# Patient Record
Sex: Male | Born: 1950 | Race: White | Hispanic: No | Smoking: Former smoker
Health system: Southern US, Community
[De-identification: ages and names within clinical notes are randomized; demographics above are authoritative.]

## PROBLEM LIST (undated history)

## (undated) DIAGNOSIS — I499 Cardiac arrhythmia, unspecified: Secondary | ICD-10-CM

## (undated) DIAGNOSIS — M109 Gout, unspecified: Secondary | ICD-10-CM

## (undated) DIAGNOSIS — I10 Essential (primary) hypertension: Secondary | ICD-10-CM

## (undated) HISTORY — PX: COLONOSCOPY: SHX174

## (undated) HISTORY — PX: CYSTOSCOPY: SUR368

## (undated) HISTORY — PX: TONSILLECTOMY: SUR1361

---

## 1998-11-17 ENCOUNTER — Other Ambulatory Visit: Admission: RE | Admit: 1998-11-17 | Discharge: 1998-11-17 | Payer: Self-pay | Admitting: Gastroenterology

## 2012-09-28 ENCOUNTER — Ambulatory Visit (HOSPITAL_COMMUNITY): Payer: BC Managed Care – PPO

## 2012-09-28 ENCOUNTER — Other Ambulatory Visit (HOSPITAL_COMMUNITY): Payer: Self-pay | Admitting: Internal Medicine

## 2012-09-28 DIAGNOSIS — R112 Nausea with vomiting, unspecified: Secondary | ICD-10-CM

## 2012-09-28 DIAGNOSIS — R945 Abnormal results of liver function studies: Secondary | ICD-10-CM

## 2014-01-17 ENCOUNTER — Ambulatory Visit (INDEPENDENT_AMBULATORY_CARE_PROVIDER_SITE_OTHER): Payer: Medicare HMO | Admitting: Otolaryngology

## 2014-01-17 DIAGNOSIS — H903 Sensorineural hearing loss, bilateral: Secondary | ICD-10-CM

## 2014-01-17 DIAGNOSIS — H6120 Impacted cerumen, unspecified ear: Secondary | ICD-10-CM

## 2015-03-06 DIAGNOSIS — J309 Allergic rhinitis, unspecified: Principal | ICD-10-CM

## 2015-03-06 DIAGNOSIS — H1010 Acute atopic conjunctivitis, unspecified eye: Secondary | ICD-10-CM

## 2015-03-20 DIAGNOSIS — M1A9XX1 Chronic gout, unspecified, with tophus (tophi): Secondary | ICD-10-CM | POA: Diagnosis not present

## 2015-03-20 DIAGNOSIS — H6122 Impacted cerumen, left ear: Secondary | ICD-10-CM | POA: Diagnosis not present

## 2015-03-20 DIAGNOSIS — L989 Disorder of the skin and subcutaneous tissue, unspecified: Secondary | ICD-10-CM | POA: Diagnosis not present

## 2015-03-20 DIAGNOSIS — K4090 Unilateral inguinal hernia, without obstruction or gangrene, not specified as recurrent: Secondary | ICD-10-CM | POA: Diagnosis not present

## 2015-03-26 DIAGNOSIS — L820 Inflamed seborrheic keratosis: Secondary | ICD-10-CM | POA: Diagnosis not present

## 2015-03-26 DIAGNOSIS — D492 Neoplasm of unspecified behavior of bone, soft tissue, and skin: Secondary | ICD-10-CM | POA: Diagnosis not present

## 2015-03-26 DIAGNOSIS — C44391 Other specified malignant neoplasm of skin of nose: Secondary | ICD-10-CM | POA: Diagnosis not present

## 2015-03-26 DIAGNOSIS — C44329 Squamous cell carcinoma of skin of other parts of face: Secondary | ICD-10-CM | POA: Diagnosis not present

## 2015-03-26 DIAGNOSIS — C44320 Squamous cell carcinoma of skin of unspecified parts of face: Secondary | ICD-10-CM | POA: Diagnosis not present

## 2015-03-26 DIAGNOSIS — D225 Melanocytic nevi of trunk: Secondary | ICD-10-CM | POA: Diagnosis not present

## 2015-04-01 ENCOUNTER — Ambulatory Visit (INDEPENDENT_AMBULATORY_CARE_PROVIDER_SITE_OTHER): Payer: Medicare HMO

## 2015-04-01 ENCOUNTER — Ambulatory Visit (INDEPENDENT_AMBULATORY_CARE_PROVIDER_SITE_OTHER): Payer: Medicare HMO | Admitting: Orthopedic Surgery

## 2015-04-01 VITALS — BP 122/70 | Ht 73.5 in | Wt 241.0 lb

## 2015-04-01 DIAGNOSIS — H1010 Acute atopic conjunctivitis, unspecified eye: Secondary | ICD-10-CM

## 2015-04-01 DIAGNOSIS — J309 Allergic rhinitis, unspecified: Secondary | ICD-10-CM | POA: Diagnosis not present

## 2015-04-01 DIAGNOSIS — M1A0491 Idiopathic chronic gout, unspecified hand, with tophus (tophi): Secondary | ICD-10-CM

## 2015-04-01 DIAGNOSIS — M25442 Effusion, left hand: Secondary | ICD-10-CM

## 2015-04-01 NOTE — Progress Notes (Signed)
Patient ID: Geoffrey Patterson, male   DOB: 1951/06/04, 64 y.o.   MRN: [id number]  Chief Complaint  Patient presents with  . Hand Problem    left first finger swelling and stiffness, REFERRED BY Z HALL    HPI Geoffrey Patterson is a 64 y.o. male.  64 years old male several month history of pain and swelling of the left index finger with mild dull ache associated with loss of motion. No specific modifying factors or associated symptoms other than the loss of motion. He does report remote injury to the index finger as a child he listed his review of systems as completely negative other than swelling and gout type symptoms over the right olecranon bursa and over the great toe. Medical history listed negative no surgeries he does take hypertensive medication no allergies see list his family history is negative doesn't smoke or drink and he is employed.  Review of Systems Review of Systems   Social History Social History  Substance Use Topics  . Smoking status: Not on file  . Smokeless tobacco: Not on file  . Alcohol Use: Not on file    Allergies not on file  Current Outpatient Prescriptions  Medication Sig Dispense Refill  . ALLOPURINOL PO Take by mouth.    . Amlodipine Besylate-Valsartan (EXFORGE PO) Take by mouth.    . Fexofenadine HCl (ALLEGRA PO) Take by mouth as needed.    . naproxen sodium (ANAPROX) 220 MG tablet Take 220 mg by mouth 2 (two) times daily with a meal.    . Triamcinolone Acetonide (NASACORT AQ NA) Place into the nose.    . ValACYclovir HCl (VALTREX PO) Take by mouth daily.     No current facility-administered medications for this visit.       Physical Exam Physical Exam Blood pressure 122/70, height 6' 1.5" (1.867 m), weight 241 lb (109.317 kg). Appearance, there are no abnormalities in terms of appearance the patient was well-developed and well-nourished. The grooming and hygiene were normal.  Mental status orientation, there was normal alertness and  orientation Mood pleasant Ambulatory status normal with no assistive devices  Examination of the left index finger reveals soft tissue swelling and gout tophus around the index finger which is blocking any range of motion although the joint is stable. Flexor tendon strength he could partially make a fist. The skin was intact warm dry no laceration ulceration or erythema but gout tophus visible through the skin. Sensation intact capillary refill normal  He also had gout tophus in the toe and also over the right elbow   Data Reviewed Imaging shows a bony erosions around the PIP joint with hyperextension deformity of the PIP joint  Assessment  Gout tophus index finger left hand   Plan  Recommend referral to Dr. Erlinda Hong for definitive management and treatment

## 2015-04-01 NOTE — Patient Instructions (Signed)
We will let Dr Nevada Crane know that you need referral to Hand Specialist Dr. Erlinda Hong for gout left index finger

## 2015-04-08 ENCOUNTER — Ambulatory Visit (INDEPENDENT_AMBULATORY_CARE_PROVIDER_SITE_OTHER): Payer: Medicare HMO

## 2015-04-08 DIAGNOSIS — J301 Allergic rhinitis due to pollen: Secondary | ICD-10-CM | POA: Diagnosis not present

## 2015-04-15 ENCOUNTER — Ambulatory Visit (INDEPENDENT_AMBULATORY_CARE_PROVIDER_SITE_OTHER): Payer: Medicare HMO

## 2015-04-15 DIAGNOSIS — J301 Allergic rhinitis due to pollen: Secondary | ICD-10-CM

## 2015-04-22 ENCOUNTER — Ambulatory Visit (INDEPENDENT_AMBULATORY_CARE_PROVIDER_SITE_OTHER): Payer: Medicare HMO

## 2015-04-22 DIAGNOSIS — J301 Allergic rhinitis due to pollen: Secondary | ICD-10-CM | POA: Diagnosis not present

## 2015-04-25 DIAGNOSIS — C44391 Other specified malignant neoplasm of skin of nose: Secondary | ICD-10-CM | POA: Diagnosis not present

## 2015-04-25 DIAGNOSIS — C44329 Squamous cell carcinoma of skin of other parts of face: Secondary | ICD-10-CM | POA: Diagnosis not present

## 2015-04-29 ENCOUNTER — Ambulatory Visit (INDEPENDENT_AMBULATORY_CARE_PROVIDER_SITE_OTHER): Payer: Medicare HMO

## 2015-04-29 DIAGNOSIS — J301 Allergic rhinitis due to pollen: Secondary | ICD-10-CM

## 2015-04-30 DIAGNOSIS — Z08 Encounter for follow-up examination after completed treatment for malignant neoplasm: Secondary | ICD-10-CM | POA: Diagnosis not present

## 2015-04-30 DIAGNOSIS — L820 Inflamed seborrheic keratosis: Secondary | ICD-10-CM | POA: Diagnosis not present

## 2015-04-30 DIAGNOSIS — Z85828 Personal history of other malignant neoplasm of skin: Secondary | ICD-10-CM | POA: Diagnosis not present

## 2015-04-30 DIAGNOSIS — D225 Melanocytic nevi of trunk: Secondary | ICD-10-CM | POA: Diagnosis not present

## 2015-04-30 DIAGNOSIS — D485 Neoplasm of uncertain behavior of skin: Secondary | ICD-10-CM | POA: Diagnosis not present

## 2015-05-27 ENCOUNTER — Ambulatory Visit (INDEPENDENT_AMBULATORY_CARE_PROVIDER_SITE_OTHER): Payer: Medicare HMO

## 2015-05-27 DIAGNOSIS — J309 Allergic rhinitis, unspecified: Secondary | ICD-10-CM

## 2015-06-23 DIAGNOSIS — R69 Illness, unspecified: Secondary | ICD-10-CM | POA: Diagnosis not present

## 2015-06-24 ENCOUNTER — Ambulatory Visit (INDEPENDENT_AMBULATORY_CARE_PROVIDER_SITE_OTHER): Payer: Medicare HMO | Admitting: Neurology

## 2015-06-24 DIAGNOSIS — J309 Allergic rhinitis, unspecified: Secondary | ICD-10-CM | POA: Diagnosis not present

## 2015-07-08 DIAGNOSIS — R69 Illness, unspecified: Secondary | ICD-10-CM | POA: Diagnosis not present

## 2015-08-19 ENCOUNTER — Ambulatory Visit (INDEPENDENT_AMBULATORY_CARE_PROVIDER_SITE_OTHER): Payer: Medicare HMO

## 2015-08-19 DIAGNOSIS — J309 Allergic rhinitis, unspecified: Secondary | ICD-10-CM

## 2015-09-01 DIAGNOSIS — E782 Mixed hyperlipidemia: Secondary | ICD-10-CM | POA: Diagnosis not present

## 2015-09-01 DIAGNOSIS — I10 Essential (primary) hypertension: Secondary | ICD-10-CM | POA: Diagnosis not present

## 2015-09-01 DIAGNOSIS — Z0000 Encounter for general adult medical examination without abnormal findings: Secondary | ICD-10-CM | POA: Diagnosis not present

## 2015-09-08 DIAGNOSIS — I10 Essential (primary) hypertension: Secondary | ICD-10-CM | POA: Diagnosis not present

## 2015-09-16 ENCOUNTER — Ambulatory Visit (INDEPENDENT_AMBULATORY_CARE_PROVIDER_SITE_OTHER): Payer: Medicare HMO

## 2015-09-16 DIAGNOSIS — J309 Allergic rhinitis, unspecified: Secondary | ICD-10-CM | POA: Diagnosis not present

## 2015-10-07 DIAGNOSIS — J3089 Other allergic rhinitis: Secondary | ICD-10-CM | POA: Diagnosis not present

## 2015-10-09 DIAGNOSIS — R69 Illness, unspecified: Secondary | ICD-10-CM | POA: Diagnosis not present

## 2015-10-14 ENCOUNTER — Ambulatory Visit (INDEPENDENT_AMBULATORY_CARE_PROVIDER_SITE_OTHER): Payer: Medicare HMO

## 2015-10-14 DIAGNOSIS — J309 Allergic rhinitis, unspecified: Secondary | ICD-10-CM | POA: Diagnosis not present

## 2015-10-21 ENCOUNTER — Ambulatory Visit (INDEPENDENT_AMBULATORY_CARE_PROVIDER_SITE_OTHER): Payer: Medicare HMO

## 2015-10-21 DIAGNOSIS — J309 Allergic rhinitis, unspecified: Secondary | ICD-10-CM | POA: Diagnosis not present

## 2015-10-28 ENCOUNTER — Ambulatory Visit (INDEPENDENT_AMBULATORY_CARE_PROVIDER_SITE_OTHER): Payer: Medicare HMO | Admitting: *Deleted

## 2015-10-28 DIAGNOSIS — J309 Allergic rhinitis, unspecified: Secondary | ICD-10-CM

## 2015-11-04 ENCOUNTER — Ambulatory Visit (INDEPENDENT_AMBULATORY_CARE_PROVIDER_SITE_OTHER): Payer: Medicare HMO

## 2015-11-04 DIAGNOSIS — J309 Allergic rhinitis, unspecified: Secondary | ICD-10-CM

## 2015-11-11 ENCOUNTER — Ambulatory Visit (INDEPENDENT_AMBULATORY_CARE_PROVIDER_SITE_OTHER): Payer: Medicare HMO

## 2015-11-11 DIAGNOSIS — J309 Allergic rhinitis, unspecified: Secondary | ICD-10-CM

## 2015-12-02 ENCOUNTER — Ambulatory Visit (INDEPENDENT_AMBULATORY_CARE_PROVIDER_SITE_OTHER): Payer: Medicare HMO

## 2015-12-02 DIAGNOSIS — J309 Allergic rhinitis, unspecified: Secondary | ICD-10-CM

## 2015-12-30 ENCOUNTER — Ambulatory Visit (INDEPENDENT_AMBULATORY_CARE_PROVIDER_SITE_OTHER): Payer: Medicare HMO

## 2015-12-30 DIAGNOSIS — J309 Allergic rhinitis, unspecified: Secondary | ICD-10-CM | POA: Diagnosis not present

## 2016-01-20 DIAGNOSIS — H6123 Impacted cerumen, bilateral: Secondary | ICD-10-CM | POA: Diagnosis not present

## 2016-01-27 ENCOUNTER — Ambulatory Visit (INDEPENDENT_AMBULATORY_CARE_PROVIDER_SITE_OTHER): Payer: Medicare HMO

## 2016-01-27 DIAGNOSIS — J309 Allergic rhinitis, unspecified: Secondary | ICD-10-CM | POA: Diagnosis not present

## 2016-02-24 ENCOUNTER — Ambulatory Visit (INDEPENDENT_AMBULATORY_CARE_PROVIDER_SITE_OTHER): Payer: Medicare HMO | Admitting: *Deleted

## 2016-02-24 DIAGNOSIS — J309 Allergic rhinitis, unspecified: Secondary | ICD-10-CM

## 2016-02-27 DIAGNOSIS — M1A9XX1 Chronic gout, unspecified, with tophus (tophi): Secondary | ICD-10-CM | POA: Diagnosis not present

## 2016-02-27 DIAGNOSIS — I10 Essential (primary) hypertension: Secondary | ICD-10-CM | POA: Diagnosis not present

## 2016-02-27 DIAGNOSIS — E790 Hyperuricemia without signs of inflammatory arthritis and tophaceous disease: Secondary | ICD-10-CM | POA: Diagnosis not present

## 2016-03-08 DIAGNOSIS — Z23 Encounter for immunization: Secondary | ICD-10-CM | POA: Diagnosis not present

## 2016-03-23 ENCOUNTER — Ambulatory Visit (INDEPENDENT_AMBULATORY_CARE_PROVIDER_SITE_OTHER): Payer: Medicare HMO | Admitting: *Deleted

## 2016-03-23 DIAGNOSIS — J309 Allergic rhinitis, unspecified: Secondary | ICD-10-CM

## 2016-03-29 DIAGNOSIS — J3089 Other allergic rhinitis: Secondary | ICD-10-CM | POA: Diagnosis not present

## 2016-04-20 ENCOUNTER — Ambulatory Visit (INDEPENDENT_AMBULATORY_CARE_PROVIDER_SITE_OTHER): Payer: Medicare HMO | Admitting: *Deleted

## 2016-04-20 DIAGNOSIS — J309 Allergic rhinitis, unspecified: Secondary | ICD-10-CM | POA: Diagnosis not present

## 2016-04-23 DIAGNOSIS — R69 Illness, unspecified: Secondary | ICD-10-CM | POA: Diagnosis not present

## 2016-05-18 ENCOUNTER — Ambulatory Visit (INDEPENDENT_AMBULATORY_CARE_PROVIDER_SITE_OTHER): Payer: Medicare HMO | Admitting: *Deleted

## 2016-05-18 DIAGNOSIS — J309 Allergic rhinitis, unspecified: Secondary | ICD-10-CM | POA: Diagnosis not present

## 2016-05-24 ENCOUNTER — Telehealth: Payer: Self-pay | Admitting: *Deleted

## 2016-05-24 NOTE — Telephone Encounter (Signed)
PT WOULD LIKE A RETURN CALL REGARDING HIS BILL. ACCOUNT NUMBER IS [id number].

## 2016-05-25 NOTE — Telephone Encounter (Signed)
Pt is wondering why he got a new vial - he also talked to [facility] - she is handling this & will call him back

## 2016-05-26 ENCOUNTER — Telehealth: Payer: Self-pay | Admitting: Allergy & Immunology

## 2016-05-26 NOTE — Telephone Encounter (Signed)
Beth, This patient called over to [facility] today and said he talked with someone yesterday about his bill. He couldn't remember who he talked to . So I was going to send to [place], but saw that she has documented that you would take care of this. He said who he talked to was going to research it and get back to him.

## 2016-05-28 NOTE — Telephone Encounter (Signed)
Called patient.  Not in and will be back in 30 minutes per receptionist.  Did not leave message.  Will call patient back.

## 2016-06-03 NOTE — Telephone Encounter (Signed)
Patient came by [facility] office on 06/01/16.  Vials and bill explained to patient.  Patient paid bill.

## 2016-06-15 ENCOUNTER — Ambulatory Visit (INDEPENDENT_AMBULATORY_CARE_PROVIDER_SITE_OTHER): Payer: Medicare HMO | Admitting: *Deleted

## 2016-06-15 DIAGNOSIS — J309 Allergic rhinitis, unspecified: Secondary | ICD-10-CM

## 2016-07-13 ENCOUNTER — Ambulatory Visit (INDEPENDENT_AMBULATORY_CARE_PROVIDER_SITE_OTHER): Payer: Medicare HMO | Admitting: *Deleted

## 2016-07-13 DIAGNOSIS — J309 Allergic rhinitis, unspecified: Secondary | ICD-10-CM

## 2016-07-16 DIAGNOSIS — R69 Illness, unspecified: Secondary | ICD-10-CM | POA: Diagnosis not present

## 2016-08-10 ENCOUNTER — Ambulatory Visit (INDEPENDENT_AMBULATORY_CARE_PROVIDER_SITE_OTHER): Payer: Medicare HMO | Admitting: *Deleted

## 2016-08-10 DIAGNOSIS — J309 Allergic rhinitis, unspecified: Secondary | ICD-10-CM

## 2016-08-10 NOTE — Addendum Note (Signed)
Addended by: Felipa Emory on: 08/10/2016 03:39 PM   Modules accepted: Orders

## 2016-08-27 DIAGNOSIS — M1A9XX1 Chronic gout, unspecified, with tophus (tophi): Secondary | ICD-10-CM | POA: Diagnosis not present

## 2016-08-27 DIAGNOSIS — I10 Essential (primary) hypertension: Secondary | ICD-10-CM | POA: Diagnosis not present

## 2016-08-27 DIAGNOSIS — Z6832 Body mass index (BMI) 32.0-32.9, adult: Secondary | ICD-10-CM | POA: Diagnosis not present

## 2016-08-27 DIAGNOSIS — L309 Dermatitis, unspecified: Secondary | ICD-10-CM | POA: Diagnosis not present

## 2016-08-27 DIAGNOSIS — Z0000 Encounter for general adult medical examination without abnormal findings: Secondary | ICD-10-CM | POA: Diagnosis not present

## 2016-08-31 ENCOUNTER — Encounter (INDEPENDENT_AMBULATORY_CARE_PROVIDER_SITE_OTHER): Payer: Self-pay | Admitting: *Deleted

## 2016-09-07 ENCOUNTER — Ambulatory Visit (INDEPENDENT_AMBULATORY_CARE_PROVIDER_SITE_OTHER): Payer: Medicare HMO | Admitting: *Deleted

## 2016-09-07 DIAGNOSIS — J309 Allergic rhinitis, unspecified: Secondary | ICD-10-CM | POA: Diagnosis not present

## 2016-09-09 ENCOUNTER — Encounter (INDEPENDENT_AMBULATORY_CARE_PROVIDER_SITE_OTHER): Payer: Self-pay | Admitting: *Deleted

## 2016-09-16 ENCOUNTER — Other Ambulatory Visit (INDEPENDENT_AMBULATORY_CARE_PROVIDER_SITE_OTHER): Payer: Self-pay | Admitting: *Deleted

## 2016-09-16 DIAGNOSIS — Z1211 Encounter for screening for malignant neoplasm of colon: Secondary | ICD-10-CM | POA: Insufficient documentation

## 2016-10-05 ENCOUNTER — Ambulatory Visit (INDEPENDENT_AMBULATORY_CARE_PROVIDER_SITE_OTHER): Payer: Medicare HMO | Admitting: *Deleted

## 2016-10-05 DIAGNOSIS — J3089 Other allergic rhinitis: Secondary | ICD-10-CM

## 2016-10-18 DIAGNOSIS — M20032 Swan-neck deformity of left finger(s): Secondary | ICD-10-CM | POA: Diagnosis not present

## 2016-10-18 DIAGNOSIS — M20012 Mallet finger of left finger(s): Secondary | ICD-10-CM | POA: Diagnosis not present

## 2016-11-02 ENCOUNTER — Ambulatory Visit (INDEPENDENT_AMBULATORY_CARE_PROVIDER_SITE_OTHER): Payer: Medicare HMO | Admitting: *Deleted

## 2016-11-02 DIAGNOSIS — J3089 Other allergic rhinitis: Secondary | ICD-10-CM

## 2016-11-15 DIAGNOSIS — R69 Illness, unspecified: Secondary | ICD-10-CM | POA: Diagnosis not present

## 2016-11-16 ENCOUNTER — Encounter (INDEPENDENT_AMBULATORY_CARE_PROVIDER_SITE_OTHER): Payer: Self-pay | Admitting: *Deleted

## 2016-11-16 ENCOUNTER — Telehealth (INDEPENDENT_AMBULATORY_CARE_PROVIDER_SITE_OTHER): Payer: Self-pay | Admitting: *Deleted

## 2016-11-16 NOTE — Telephone Encounter (Signed)
Patient needs trilyte 

## 2016-11-17 MED ORDER — PEG 3350-KCL-NA BICARB-NACL 420 G PO SOLR: 4000.0000 mL | Freq: Once | ORAL | 0 refills | Status: AC

## 2016-11-17 NOTE — Telephone Encounter (Signed)
Geoffrey Patterson, No drug listed.

## 2016-11-17 NOTE — Telephone Encounter (Signed)
Geoffrey Patterson this one failed to go through to the pharmacy.

## 2016-11-17 NOTE — Addendum Note (Signed)
Addended by: Butch Penny on: 11/17/2016 12:28 PM   Modules accepted: Orders

## 2016-11-17 NOTE — Addendum Note (Signed)
Addended by: Lacretia Nicks H on: 11/17/2016 12:09 PM   Modules accepted: Orders

## 2016-11-23 NOTE — Progress Notes (Signed)
Vials to be made 11-24-16  jm 

## 2016-11-24 DIAGNOSIS — J3089 Other allergic rhinitis: Secondary | ICD-10-CM | POA: Diagnosis not present

## 2016-11-30 ENCOUNTER — Ambulatory Visit (INDEPENDENT_AMBULATORY_CARE_PROVIDER_SITE_OTHER): Payer: Medicare HMO | Admitting: *Deleted

## 2016-11-30 DIAGNOSIS — J3089 Other allergic rhinitis: Secondary | ICD-10-CM

## 2016-12-08 DIAGNOSIS — D481 Neoplasm of uncertain behavior of connective and other soft tissue: Secondary | ICD-10-CM | POA: Diagnosis not present

## 2016-12-08 DIAGNOSIS — M25642 Stiffness of left hand, not elsewhere classified: Secondary | ICD-10-CM | POA: Diagnosis not present

## 2016-12-10 ENCOUNTER — Telehealth (INDEPENDENT_AMBULATORY_CARE_PROVIDER_SITE_OTHER): Payer: Self-pay | Admitting: *Deleted

## 2016-12-10 NOTE — Telephone Encounter (Signed)
agree

## 2016-12-10 NOTE — Telephone Encounter (Signed)
Referring MD/PCP: hall   Procedure: tcs  Reason/Indication:  screening  Has patient had this procedure before?  Yes, over 10 yrs ago  If so, when, by whom and where?    Is there a family history of colon cancer?  no  Who?  What age when diagnosed?    Is patient diabetic?   no      Does patient have prosthetic heart valve or mechanical valve?  no  Do you have a pacemaker?  no  Has patient ever had endocarditis? no  Has patient had joint replacement within last 12 months?  no  Does patient tend to be constipated or take laxatives? no  Does patient have a history of alcohol/drug use?  no  Is patient on Coumadin, Plavix and/or Aspirin? Not on daily basis  Medications: amlodipine/valsartan 10 mg daily, asa prn  Allergies: nkda  Medication Adjustment per Dr Laural Golden: asa 2 days  Procedure date & time: 12/30/16 at 1030

## 2016-12-28 ENCOUNTER — Ambulatory Visit (INDEPENDENT_AMBULATORY_CARE_PROVIDER_SITE_OTHER): Payer: Medicare HMO | Admitting: *Deleted

## 2016-12-28 DIAGNOSIS — J3089 Other allergic rhinitis: Secondary | ICD-10-CM

## 2016-12-30 ENCOUNTER — Encounter (HOSPITAL_COMMUNITY)
Admission: RE | Disposition: A | Discharge status: Home / Self Care | Payer: Self-pay | Source: Ambulatory Visit | Attending: Internal Medicine

## 2016-12-30 ENCOUNTER — Ambulatory Visit (HOSPITAL_COMMUNITY)
Admission: RE | Admit: 2016-12-30 | Discharge: 2016-12-30 | Disposition: A | Discharge status: Home / Self Care | Payer: Medicare HMO | Source: Ambulatory Visit | Attending: Internal Medicine | Admitting: Internal Medicine

## 2016-12-30 ENCOUNTER — Encounter (HOSPITAL_COMMUNITY): Payer: Self-pay

## 2016-12-30 DIAGNOSIS — M109 Gout, unspecified: Secondary | ICD-10-CM | POA: Insufficient documentation

## 2016-12-30 DIAGNOSIS — I10 Essential (primary) hypertension: Secondary | ICD-10-CM | POA: Diagnosis not present

## 2016-12-30 DIAGNOSIS — K648 Other hemorrhoids: Secondary | ICD-10-CM | POA: Diagnosis not present

## 2016-12-30 DIAGNOSIS — K621 Rectal polyp: Secondary | ICD-10-CM | POA: Diagnosis not present

## 2016-12-30 DIAGNOSIS — Z79899 Other long term (current) drug therapy: Secondary | ICD-10-CM | POA: Diagnosis not present

## 2016-12-30 DIAGNOSIS — K5730 Diverticulosis of large intestine without perforation or abscess without bleeding: Secondary | ICD-10-CM | POA: Diagnosis not present

## 2016-12-30 DIAGNOSIS — K635 Polyp of colon: Secondary | ICD-10-CM | POA: Diagnosis not present

## 2016-12-30 DIAGNOSIS — D123 Benign neoplasm of transverse colon: Secondary | ICD-10-CM | POA: Diagnosis not present

## 2016-12-30 DIAGNOSIS — Z1211 Encounter for screening for malignant neoplasm of colon: Secondary | ICD-10-CM | POA: Insufficient documentation

## 2016-12-30 HISTORY — PX: POLYPECTOMY: SHX5525

## 2016-12-30 HISTORY — DX: Essential (primary) hypertension: I10

## 2016-12-30 HISTORY — DX: Gout, unspecified: M10.9

## 2016-12-30 HISTORY — PX: COLONOSCOPY: SHX5424

## 2016-12-30 SURGERY — COLONOSCOPY
Anesthesia: Moderate Sedation

## 2016-12-30 MED ORDER — MEPERIDINE HCL 50 MG/ML IJ SOLN
INTRAMUSCULAR | Status: AC
  Filled 2016-12-30: qty 1

## 2016-12-30 MED ORDER — SODIUM CHLORIDE 0.9 % IV SOLN
INTRAVENOUS | Status: DC
  Administered 2016-12-30: 09:00:00 via INTRAVENOUS

## 2016-12-30 MED ORDER — STERILE WATER FOR IRRIGATION IR SOLN
Status: DC | PRN
  Administered 2016-12-30: 10:00:00

## 2016-12-30 MED ORDER — MIDAZOLAM HCL 5 MG/5ML IJ SOLN
INTRAMUSCULAR | Status: DC | PRN
  Administered 2016-12-30: 2 mg via INTRAVENOUS
  Administered 2016-12-30 (×2): 1 mg via INTRAVENOUS
  Administered 2016-12-30: 2 mg via INTRAVENOUS

## 2016-12-30 MED ORDER — MEPERIDINE HCL 50 MG/ML IJ SOLN
INTRAMUSCULAR | Status: DC | PRN
Start: 2016-12-30 — End: 2016-12-30
  Administered 2016-12-30 (×2): 25 mg via INTRAVENOUS

## 2016-12-30 MED ORDER — MIDAZOLAM HCL 5 MG/5ML IJ SOLN
INTRAMUSCULAR | Status: AC
  Filled 2016-12-30: qty 10

## 2016-12-30 NOTE — Op Note (Signed)
[facility] Patient Name: Geoffrey Patterson Procedure Date: 12/30/2016 10:11 AM MRN: [id number] Date of Birth: 1950-12-31 Attending MD: Hildred Laser , MD CSN: [id number] Age: 66 Admit Type: Outpatient Procedure:                Colonoscopy Indications:              Screening for colorectal malignant neoplasm Providers:                Hildred Laser, MD, Otis Peak B. Sharon Seller, RN, Bonnetta Barry, Technician Referring MD:             Delphina Cahill, MD Medicines:                Meperidine 50 mg IV, Midazolam 6 mg IV Complications:            No immediate complications. Estimated Blood Loss:     Estimated blood loss was minimal. Procedure:                Pre-Anesthesia Assessment:                           - Prior to the procedure, a History and Physical                            was performed, and patient medications and                            allergies were reviewed. The patient's tolerance of                            previous anesthesia was also reviewed. The risks                            and benefits of the procedure and the sedation                            options and risks were discussed with the patient.                            All questions were answered, and informed consent                            was obtained. Prior Anticoagulants: The patient has                            taken no previous anticoagulant or antiplatelet                            agents. ASA Grade Assessment: II - A patient with                            mild systemic disease. After reviewing the risks  and benefits, the patient was deemed in                            satisfactory condition to undergo the procedure.                           After obtaining informed consent, the colonoscope                            was passed under direct vision. Throughout the                            procedure, the patient's blood pressure, pulse, and                             oxygen saturations were monitored continuously. The                            EC-[id number] ([id number]) scope was introduced through                            the anus and advanced to the the cecum, identified                            by appendiceal orifice and ileocecal valve. The                            colonoscopy was performed without difficulty. The                            patient tolerated the procedure well. The quality                            of the bowel preparation was good. The ileocecal                            valve, appendiceal orifice, and rectum were                            photographed. Scope In: 10:27:48 AM Scope Out: 10:52:03 AM Scope Withdrawal Time: 0 hours 18 minutes 36 seconds  Total Procedure Duration: 0 hours 24 minutes 15 seconds  Findings:      The perianal and digital rectal examinations were normal.      Two carpet-like polyps were found in the hepatic flexure. The polyps       were 4 to 10 mm in size. Biopsies were taken with a cold forceps for       histology. The pathology specimen was placed into Bottle Number 1.       Fulguration to ablate the lesion by argon plasma was successful. The       pathology specimen was placed into Bottle Number 1.      A few small-mouthed diverticula were found in the sigmoid colon.      A 3 mm polyp was found in the rectum. The polyp was semi-sessile. The  polyp was removed with a cold snare. Resection and retrieval were       complete. The pathology specimen was placed into Bottle Number 2.      Internal hemorrhoids were found during retroflexion. The hemorrhoids       were small. Impression:               - Two 4 to 10 mm from like polyps located side by                            side at the hepatic flexure. Both of these polyps                            were biopsied for histology and risks and residual                            polyps ablated with argon plasma coagulation  (APC).                           - Diverticulosis in the sigmoid colon.                           - One 3 mm polyp in the rectum, removed with a cold                            snare. Resected and retrieved.                           - Internal hemorrhoids. Moderate Sedation:      Moderate (conscious) sedation was administered by the endoscopy nurse       and supervised by the endoscopist. The following parameters were       monitored: oxygen saturation, heart rate, blood pressure, CO2       capnography and response to care. Total physician intraservice time was       30 minutes. Recommendation:           - Patient has a contact number available for                            emergencies. The signs and symptoms of potential                            delayed complications were discussed with the                            patient. Return to normal activities tomorrow.                            Written discharge instructions were provided to the                            patient.                           - High fiber diet today.                           -  Continue present medications.                           - No aspirin, ibuprofen, naproxen, or other                            non-steroidal anti-inflammatory drugs for 7 days                            after polyp removal.                           - Await pathology results.                           - Repeat colonoscopy for surveillance based on                            pathology results.                           - 12 lead EKG to be obtained to determine type of                            arrhythmia. Procedure Code(s):        --- Professional ---                           [phone], Colonoscopy, flexible; with ablation of                            tumor(s), polyp(s), or other lesion(s) (includes                            pre- and post-dilation and guide wire passage, when                            performed)                            45385, 59, Colonoscopy, flexible; with removal of                            tumor(s), polyp(s), or other lesion(s) by snare                            technique                           99152, Moderate sedation services provided by the                            same physician or other qualified health care                            professional performing the diagnostic or  therapeutic service that the sedation supports,                            requiring the presence of an independent trained                            observer to assist in the monitoring of the                            patient's level of consciousness and physiological                            status; initial 15 minutes of intraservice time,                            patient age 70 years or older                           [phone], Moderate sedation services; each additional                            15 minutes intraservice time Diagnosis Code(s):        --- Professional ---                           Z12.11, Encounter for screening for malignant                            neoplasm of colon                           D12.3, Benign neoplasm of transverse colon (hepatic                            flexure or splenic flexure)                           K64.8, Other hemorrhoids                           K62.1, Rectal polyp                           K57.30, Diverticulosis of large intestine without                            perforation or abscess without bleeding CPT copyright 2016 [employer]. All rights reserved. The codes documented in this report are preliminary and upon coder review may  be revised to meet current compliance requirements. Hildred Laser, MD Hildred Laser, MD 12/30/2016 11:02:47 AM This report has been signed electronically. Number of Addenda: 0

## 2016-12-30 NOTE — H&P (Signed)
Geoffrey Patterson is an 66 y.o. male.   Chief Complaint: Patient is here for screening colonoscopy. HPI: Patient 66 year old Caucasian male who is here for screening colonoscopy. Last exam was over 10 years ago with removal of small polyps hyperplastic. He denies abdominal pain change in bowel habits or rectal disease. Family history is negative for CRC.  Past Medical History:  Diagnosis Date  . Gout   . Hypertension     Past Surgical History:  Procedure Laterality Date  . COLONOSCOPY      History reviewed. No pertinent family history. Social History:  reports that he has never smoked. He has never used smokeless tobacco. He reports that he does not drink alcohol or use drugs.  Allergies: No Known Allergies  Medications Prior to Admission  Medication Sig Dispense Refill  . allopurinol (ZYLOPRIM) 300 MG tablet Take 300 mg by mouth 2 (two) times daily.  2  . amLODipine-valsartan (EXFORGE) 10-320 MG tablet Take 1 tablet by mouth daily.  3  . colchicine 0.6 MG tablet Take 0.6 mg by mouth daily.  3    No results found for this or any previous visit (from the past 48 hour(s)). No results found.  ROS  Blood pressure (!) 156/85, pulse 95, temperature 98.2 F (36.8 C), temperature source Oral, resp. rate 16, height 6\' 1"  (1.854 m), weight 245 lb (111.1 kg), SpO2 98 %. Physical Exam  Constitutional: He appears well-developed and well-nourished.  HENT:  Mouth/Throat: Oropharynx is clear and moist.  Eyes: Conjunctivae are normal. No scleral icterus.  Neck: No thyromegaly present.  Cardiovascular:  Irregular rhythm normal S1 and S2. No murmur or gallop noted.  Respiratory: Effort normal and breath sounds normal.  Musculoskeletal: He exhibits no edema.  Lymphadenopathy:    He has no cervical adenopathy.  Neurological: He is alert.  Skin: Skin is warm and dry.     Assessment/Plan Average risk screening colonoscopy.  Geoffrey Laser, MD 12/30/2016, 10:18 AM

## 2016-12-30 NOTE — Discharge Instructions (Signed)
No aspirin or NSAIDs for 1 week. Resume usual medications and high fiber diet. No driving for 24 hours. Physician will call with biopsy results. Follow-up with Dr. Delphina Cahill regarding atrial fibrillation. If you experience postural lightheadedness or abdomen heartbeat please report to emergency room.   High-Fiber Diet Fiber, also called dietary fiber, is a type of carbohydrate found in fruits, vegetables, whole grains, and beans. A high-fiber diet can have many health benefits. Your health care provider may recommend a high-fiber diet to help:  Prevent constipation. Fiber can make your bowel movements more regular.  Lower your cholesterol.  Relieve hemorrhoids, uncomplicated diverticulosis, or irritable bowel syndrome.  Prevent overeating as part of a weight-loss plan.  Prevent heart disease, type 2 diabetes, and certain cancers.  What is my plan? The recommended daily intake of fiber includes:  38 grams for men under age 66.  54 grams for men over age 57.  41 grams for women under age 62.  44 grams for women over age 52.  You can get the recommended daily intake of dietary fiber by eating a variety of fruits, vegetables, grains, and beans. Your health care provider may also recommend a fiber supplement if it is not possible to get enough fiber through your diet. What do I need to know about a high-fiber diet?  Fiber supplements have not been widely studied for their effectiveness, so it is better to get fiber through food sources.  Always check the fiber content on thenutrition facts label of any prepackaged food. Look for foods that contain at least 5 grams of fiber per serving.  Ask your dietitian if you have questions about specific foods that are related to your condition, especially if those foods are not listed in the following section.  Increase your daily fiber consumption gradually. Increasing your intake of dietary fiber too quickly may cause bloating, cramping, or  gas.  Drink plenty of water. Water helps you to digest fiber. What foods can I eat? Grains Whole-grain breads. Multigrain cereal. Oats and oatmeal. Brown rice. Barley. Bulgur wheat. [place]. Bran muffins. Popcorn. Rye wafer crackers. Vegetables Sweet potatoes. Spinach. Kale. Artichokes. Cabbage. Broccoli. Green peas. Carrots. Squash. Fruits Berries. Pears. Apples. Oranges. Avocados. Prunes and raisins. Dried figs. Meats and Other Protein Sources Navy, kidney, pinto, and soy beans. Split peas. Lentils. Nuts and seeds. Dairy Fiber-fortified yogurt. Beverages Fiber-fortified soy milk. Fiber-fortified orange juice. Other Fiber bars. The items listed above may not be a complete list of recommended foods or beverages. Contact your dietitian for more options. What foods are not recommended? Grains White bread. Pasta made with refined flour. White rice. Vegetables Fried potatoes. Canned vegetables. Well-cooked vegetables. Fruits Fruit juice. Cooked, strained fruit. Meats and Other Protein Sources Fatty cuts of meat. Fried Sales executive or fried fish. Dairy Milk. Yogurt. Cream cheese. Sour cream. Beverages Soft drinks. Other Cakes and pastries. Butter and oils. The items listed above may not be a complete list of foods and beverages to avoid. Contact your dietitian for more information. What are some tips for including high-fiber foods in my diet?  Eat a wide variety of high-fiber foods.  Make sure that half of all grains consumed each day are whole grains.  Replace breads and cereals made from refined flour or white flour with whole-grain breads and cereals.  Replace white rice with brown rice, bulgur wheat, or millet.  Start the day with a breakfast that is high in fiber, such as a cereal that contains at least 5 grams of  fiber per serving.  Use beans in place of meat in soups, salads, or pasta.  Eat high-fiber snacks, such as berries, raw vegetables, nuts, or popcorn. This  information is not intended to replace advice given to you by your health care provider. Make sure you discuss any questions you have with your health care provider. Document Released: 05/31/2005 Document Revised: 11/06/2015 Document Reviewed: 11/13/2013 Elsevier Interactive Patient Education  2017 [employer].    Colonoscopy, Adult, Care After This sheet gives you information about how to care for yourself after your procedure. Your health care provider may also give you more specific instructions. If you have problems or questions, contact your health care provider. What can I expect after the procedure? After the procedure, it is common to have:  A small amount of blood in your stool for 24 hours after the procedure.  Some gas.  Mild abdominal cramping or bloating.  Follow these instructions at home: General instructions   For the first 24 hours after the procedure: ? Do not drive or use machinery. ? Do not sign important documents. ? Do not drink alcohol. ? Do your regular daily activities at a slower pace than normal. ? Eat soft, easy-to-digest foods. ? Rest often.  Take over-the-counter or prescription medicines only as told by your health care provider.  It is up to you to get the results of your procedure. Ask your health care provider, or the department performing the procedure, when your results will be ready. Relieving cramping and bloating  Try walking around when you have cramps or feel bloated.  Apply heat to your abdomen as told by your health care provider. Use a heat source that your health care provider recommends, such as a moist heat pack or a heating pad. ? Place a towel between your skin and the heat source. ? Leave the heat on for 20-30 minutes. ? Remove the heat if your skin turns bright red. This is especially important if you are unable to feel pain, heat, or cold. You may have a greater risk of getting burned. Eating and drinking  Drink enough  fluid to keep your urine clear or pale yellow.  Resume your normal diet as instructed by your health care provider. Avoid heavy or fried foods that are hard to digest.  Avoid drinking alcohol for as long as instructed by your health care provider. Contact a health care provider if:  You have blood in your stool 2-3 days after the procedure. Get help right away if:  You have more than a small spotting of blood in your stool.  You pass large blood clots in your stool.  Your abdomen is swollen.  You have nausea or vomiting.  You have a fever.  You have increasing abdominal pain that is not relieved with medicine. This information is not intended to replace advice given to you by your health care provider. Make sure you discuss any questions you have with your health care provider. Document Released: 01/13/2004 Document Revised: 02/23/2016 Document Reviewed: 08/12/2015 Elsevier Interactive Patient Education  2018 [employer].  Colon Polyps Polyps are tissue growths inside the body. Polyps can grow in many places, including the large intestine (colon). A polyp may be a round bump or a mushroom-shaped growth. You could have one polyp or several. Most colon polyps are noncancerous (benign). However, some colon polyps can become cancerous over time. What are the causes? The exact cause of colon polyps is not known. What increases the risk? This condition  is more likely to develop in people who:  Have a family history of colon cancer or colon polyps.  Are older than 44 or older than 45 if they are African American.  Have inflammatory bowel disease, such as ulcerative colitis or Crohn disease.  Are overweight.  Smoke cigarettes.  Do not get enough exercise.  Drink too much alcohol.  Eat a diet that is: ? High in fat and red meat. ? Low in fiber.  Had childhood cancer that was treated with abdominal radiation.  What are the signs or symptoms? Most polyps do not cause  symptoms. If you have symptoms, they may include:  Blood coming from your rectum when having a bowel movement.  Blood in your stool.The stool may look dark red or black.  A change in bowel habits, such as constipation or diarrhea.  How is this diagnosed? This condition is diagnosed with a colonoscopy. This is a procedure that uses a lighted, flexible scope to look at the inside of your colon. How is this treated? Treatment for this condition involves removing any polyps that are found. Those polyps will then be tested for cancer. If cancer is found, your health care provider will talk to you about options for colon cancer treatment. Follow these instructions at home: Diet  Eat plenty of fiber, such as fruits, vegetables, and whole grains.  Eat foods that are high in calcium and vitamin D, such as milk, cheese, yogurt, eggs, liver, fish, and broccoli.  Limit foods high in fat, red meats, and processed meats, such as hot dogs, sausage, bacon, and lunch meats.  Maintain a healthy weight, or lose weight if recommended by your health care provider. General instructions  Do not smoke cigarettes.  Do not drink alcohol excessively.  Keep all follow-up visits as told by your health care provider. This is important. This includes keeping regularly scheduled colonoscopies. Talk to your health care provider about when you need a colonoscopy.  Exercise every day or as told by your health care provider. Contact a health care provider if:  You have new or worsening bleeding during a bowel movement.  You have new or increased blood in your stool.  You have a change in bowel habits.  You unexpectedly lose weight. This information is not intended to replace advice given to you by your health care provider. Make sure you discuss any questions you have with your health care provider. Document Released: 02/25/2004 Document Revised: 11/06/2015 Document Reviewed: 04/21/2015 Elsevier Interactive  Patient Education  [employer].

## 2017-01-03 DIAGNOSIS — K635 Polyp of colon: Secondary | ICD-10-CM | POA: Diagnosis not present

## 2017-01-03 DIAGNOSIS — Z6832 Body mass index (BMI) 32.0-32.9, adult: Secondary | ICD-10-CM | POA: Diagnosis not present

## 2017-01-03 DIAGNOSIS — I10 Essential (primary) hypertension: Secondary | ICD-10-CM | POA: Diagnosis not present

## 2017-01-03 DIAGNOSIS — I4891 Unspecified atrial fibrillation: Secondary | ICD-10-CM | POA: Diagnosis not present

## 2017-01-04 ENCOUNTER — Encounter (HOSPITAL_COMMUNITY): Payer: Self-pay | Admitting: Internal Medicine

## 2017-01-21 ENCOUNTER — Encounter: Payer: Self-pay | Admitting: *Deleted

## 2017-01-23 NOTE — Progress Notes (Signed)
Cardiology Office Note   Date:  01/24/2017   ID:  Geoffrey Patterson, DOB [birth year], MRN [id number]  PCP:  Celene Squibb, MD  Cardiologist:   Jenkins Rouge, MD   No chief complaint on file.     History of Present Illness: Geoffrey Patterson is a 66 y.o. male who presents for consultation regarding atrial fibrillation. Referred by Dr Geoffrey Patterson History of Gout and HTN.   This patients CHA2DS2-VASc Score and unadjusted Ischemic Stroke Rate (% per year) is equal to 2.2 % stroke rate/year from a score of 2  Above score calculated as 1 point each if present [CHF, HTN, DM, Vascular=MI/PAD/Aortic Plaque, Age if 65-74, or Male] Above score calculated as 2 points each if present [Age > 75, or Stroke/TIA/TE]  Noted while monitoring colonoscopy. 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   Denies chest pain palpitations syncope or dyspnea.    Past Medical History:  Diagnosis Date  . Gout   . Hypertension     Past Surgical History:  Procedure Laterality Date  . COLONOSCOPY    . COLONOSCOPY N/A 12/30/2016   Procedure: COLONOSCOPY;  Surgeon: Geoffrey Houston, MD;  Location: [facility];  Service: Endoscopy;  Laterality: N/A;  1030  . POLYPECTOMY  12/30/2016   Procedure: POLYPECTOMY;  Surgeon: Geoffrey Houston, MD;  Location: [facility];  Service: Endoscopy;;  colon      Current Outpatient Prescriptions  Medication Sig Dispense Refill  . allopurinol (ZYLOPRIM) 100 MG tablet Take 100 mg by mouth daily.    Marland Kitchen amLODipine-valsartan (EXFORGE) 10-320 MG tablet Take 1 tablet by mouth daily.  3  . apixaban (ELIQUIS) 5 MG TABS tablet Take 5 mg by mouth 2 (two) times daily.    . cetirizine (ZYRTEC) 10 MG tablet Take 10 mg by mouth at bedtime.    . colchicine 0.6 MG  tablet Take 0.6 mg by mouth daily.  3  . diltiazem (CARDIZEM CD) 120 MG 24 hr capsule Take 120 mg by mouth daily.    . indomethacin (INDOCIN) 25 MG capsule Take 25 mg by mouth daily.    Marland Kitchen olmesartan (BENICAR) 40 MG tablet Take 40 mg by mouth daily.    . valACYclovir (VALTREX) 500 MG tablet Take 500 mg by mouth 2 (two) times daily.     No current facility-administered medications for this visit.     Allergies:   Patient has no known allergies.    Social History:  The patient  reports that he has never smoked. He has never used smokeless tobacco. He reports that he does not drink alcohol or use drugs.   Family History:  The patient's family history includes Diabetic kidney disease in his father; Hypertension in his father and mother.    ROS:  Please see the history of present illness.   Otherwise, review of systems are positive for none.   All other systems are reviewed and negative.    PHYSICAL EXAM: VS:  BP 124/78 (BP Location: Left Arm)   Pulse (!) 101   Ht 6' 1.5" (1.867 m)   Wt 254 lb (115.2 kg)   SpO2 96%   BMI 33.06 kg/m  , BMI Body mass index is 33.06 kg/m. Affect appropriate Healthy:  appears stated age 71: normal Neck supple with no adenopathy JVP normal no bruits no thyromegaly Lungs clear with no wheezing and good diaphragmatic motion Heart:  S1/S2 no murmur, no rub, gallop or click PMI normal Abdomen: benighn, BS positve, no tenderness, no AAA no bruit.  No HSM or HJR Distal pulses intact with no bruits No edema Neuro non-focal Skin warm and dry No muscular weakness    EKG:   afib rate 100 low voltage ? Old IMI 12/30/16   Recent Labs: No results found for requested labs within last 8760 hours.    Lipid Panel No results found for: CHOL, TRIG, HDL, CHOLHDL, VLDL, LDLCALC, LDLDIRECT    Wt Readings from Last 3 Encounters:  01/24/17 254 lb (115.2 kg)  12/30/16 245 lb (111.1 kg)  04/01/15 241 lb (109.3 kg)      Other studies  Reviewed: Additional studies/ records that were reviewed today include: Notes from primary labs .    ASSESSMENT AND PLAN:  1.  Afib:  Asymptomatic f/u echo and exercise myovue to r/o structural heart disease and guid Rx continue current meds for rate control and eliquis for stroke prevention Inclined to pursue rate control and anticoagulation strategy and Not [facility] since he is asymptomatic  2. HTN:  Well controlled.  Continue current medications and low sodium Dash type diet.   3. Polyps:  Biopsy negative no bleeding issues f/u GI   Current medicines are reviewed at length with the patient today.  The patient does not have concerns regarding medicines.  The following changes have been made:  no change  Labs/ tests ordered today include: Echo Ex Myovue   Orders Placed This Encounter  Procedures  . NM Myocar Multi W/Spect W/Wall Motion / EF  . EKG 12-Lead  . ECHOCARDIOGRAM COMPLETE     Disposition:   FU with me in 6 months if tests ok      Signed, Jenkins Rouge, MD  01/24/2017 1:27 PM    [facility] [place], [place], [place]  [address] Phone: [phone]; Fax: [phone]

## 2017-01-24 ENCOUNTER — Ambulatory Visit (INDEPENDENT_AMBULATORY_CARE_PROVIDER_SITE_OTHER): Payer: Medicare HMO | Admitting: Cardiovascular Disease

## 2017-01-24 ENCOUNTER — Encounter: Payer: Self-pay | Admitting: Cardiovascular Disease

## 2017-01-24 VITALS — BP 124/78 | HR 101 | Ht 73.5 in | Wt 254.0 lb

## 2017-01-24 DIAGNOSIS — I4891 Unspecified atrial fibrillation: Secondary | ICD-10-CM | POA: Diagnosis not present

## 2017-01-24 NOTE — Patient Instructions (Signed)
Medication Instructions:  Your physician recommends that you continue on your current medications as directed. Please refer to the Current Medication list given to you today.   Labwork: NONE  Testing/Procedures: Your physician has requested that you have an echocardiogram. Echocardiography is a painless test that uses sound waves to create images of your heart. It provides your doctor with information about the size and shape of your heart and how well your heart's chambers and valves are working. This procedure takes approximately one hour. There are no restrictions for this procedure.  Your physician has requested that you have en exercise stress myoview. For further information please visit [email or web address]. Please follow instruction sheet, as given.    Follow-Up: Your physician wants you to follow-up in: 6 MONTHS .  You will receive a reminder letter in the mail two months in advance. If you don't receive a letter, please call our office to schedule the follow-up appointment.   Any Other Special Instructions Will Be Listed Below (If Applicable).     If you need a refill on your cardiac medications before your next appointment, please call your pharmacy.

## 2017-01-25 ENCOUNTER — Encounter: Payer: Self-pay | Admitting: Allergy & Immunology

## 2017-01-25 ENCOUNTER — Ambulatory Visit (INDEPENDENT_AMBULATORY_CARE_PROVIDER_SITE_OTHER): Payer: Medicare HMO | Admitting: Allergy & Immunology

## 2017-01-25 VITALS — BP 122/82 | HR 89 | Resp 19 | Wt 253.4 lb

## 2017-01-25 DIAGNOSIS — J302 Other seasonal allergic rhinitis: Secondary | ICD-10-CM | POA: Diagnosis not present

## 2017-01-25 DIAGNOSIS — J3089 Other allergic rhinitis: Secondary | ICD-10-CM

## 2017-01-25 DIAGNOSIS — J4540 Moderate persistent asthma, uncomplicated: Secondary | ICD-10-CM

## 2017-01-25 MED ORDER — FLUTICASONE FUROATE-VILANTEROL 100-25 MCG/INH IN AEPB: 1.0000 | INHALATION_SPRAY | Freq: Every day | RESPIRATORY_TRACT | 3 refills | Status: DC

## 2017-01-25 MED ORDER — ALBUTEROL SULFATE HFA 108 (90 BASE) MCG/ACT IN AERS: 4.0000 | INHALATION_SPRAY | Freq: Four times a day (QID) | RESPIRATORY_TRACT | 2 refills | Status: DC | PRN

## 2017-01-25 NOTE — Patient Instructions (Addendum)
1. Seasonal and perennial allergic rhinitis - Continue course of allergen immunotherapy at the current schedule. - Once you start a new vial, we can advance more quickly since you have been on it for so long: 0.34mL --> 0.36mL --> 0.35mL - Continue with antihistamine as needed.  - I would go back to see Dr. Benjamine Mola to get the ear wax cleared up or use the Debrox at home to see if you can break it up yourself.  - I would continue with the workup that your Cardiologist is recommending.   2. Mild persistent asthma - Your lung function looked slightly abnormal today, but you did improve with albuterol nebulizer. - This points towards a diagnosis of asthma. - We will start you on a daily inhaled medication to see if this helps: Breo 100/25 one puff once daily - Daily controller medication(s): Breo 100/25 one puff once daily - Rescue medications: ProAir 4 puffs every 4-6 hours as needed - Asthma control goals:  * Full participation in all desired activities (may need albuterol before activity) * Albuterol use two time or less a week on average (not counting use with activity) * Cough interfering with sleep two time or less a month * Oral steroids no more than once a year * No hospitalizations  3. Return in about 3 months (around 04/27/2017).   Please inform us of any Emergency Department visits, hospitalizations, or changes in symptoms. Call us before going to the ED for breathing or allergy symptoms since we might be able to fit you in for a sick visit. Feel free to contact us anytime with any questions, problems, or concerns.  It was a pleasure to meet you today! Enjoy the rest of your summer!   Websites that have reliable patient information: 1. American Academy of Asthma, Allergy, and Immunology: www.aaaai.org 2. Food Allergy Research and Education (FARE): foodallergy.org 3. Mothers of Asthmatics: http://www.asthmacommunitynetwork.org 4. American College of Allergy, Asthma, and Immunology:  www.acaai.org   Election Day is coming up on Tuesday, November 6th! Make your voice heard! Register to vote at vote.org!

## 2017-01-25 NOTE — Progress Notes (Signed)
FOLLOW UP  Date of Service/Encounter:  01/25/17   Assessment:   Seasonal and perennial allergic rhinitis  Moderate persistent asthma, uncomplicated - Plan: Spirometry with Graph (weeds, dust mite, cat, dog, cockroach)  Fatigue with evidence of obstructive disease on spirometry today - ? asthma  Plan/Recommendations:   1. Seasonal and perennial allergic rhinitis - Continue course of allergen immunotherapy at the current schedule. - Once you start a new vial, we can advance more quickly since you have been on it for so long: 0.33mL --> 0.87mL --> 0.33mL - Continue with antihistamine as needed.  - I would go back to see Dr. Benjamine Mola to get the ear wax cleared up or use the Debrox at home to see if you can break it up yourself.  - I would continue with the workup that your Cardiologist is recommending.   2. Mild persistent asthma - Your lung function looked slightly abnormal today, but you did improve some with albuterol nebulizer. - This points towards a diagnosis of asthma. - We will start you on a daily inhaled medication to see if this helps: Breo 100/25 one puff once daily - Daily controller medication(s): Breo 100/25 one puff once daily - Rescue medications: ProAir 4 puffs every 4-6 hours as needed - Asthma control goals:  * Full participation in all desired activities (may need albuterol before activity) * Albuterol use two time or less a week on average (not counting use with activity) * Cough interfering with sleep two time or less a month * Oral steroids no more than once a year * No hospitalizations  3. Return in about 3 months (around 04/27/2017).    Subjective:   Geoffrey Patterson is a 66 y.o. male presenting today for follow up of  Chief Complaint  Patient presents with  . Allergies    Currently maintained with shots. Taking antihistamines as needed.    Geoffrey Patterson has a history of the following: Patient Active Problem List   Diagnosis Date Noted  .  Special screening for malignant neoplasms, colon 09/16/2016  . Allergic rhinoconjunctivitis 03/06/2015    History obtained from: chart review and patient.  Geoffrey Patterson's Primary Care Provider is Celene Squibb, MD.     Geoffrey Patterson is a 66 y.o. male presenting for a follow up visit. He has a history of allergic rhinitis it has been on allergy shots for over 10 years. He has not been seen for an office visi in over 2 years.  Since the last visit, he has done well. He has been on shots since 2007 and has done well. He is still "not where [he] wants to be". He feels sleepy all of the time and does not have the "drive". He has brought this up with his PCP where has had his blood pressure medications altered. He did have a colonoscopy a few weeks ago and ended up going to see a Cardiologist yesterday. He is going to have a walking stress test done. He feels that he rests soundly and wakes up well rested. He denies wheezing, coughing, or shortness of breath. All he can really endorse today is fatigue. He has never needed albuterol or nebulizer treatment.  He is endorsing ear popping throughout the year. He has a history marked wax production. He had no fevers. Ear fullness has been ongoing "forever". He has seen an ENT provider in the in the past and has seen Dr. Anthony Sar a "while back". She denies sinus pressure and pain. She  denies discharge.   Blease is on allergen immunotherapy. He receives one injection. Immunotherapy script #1 contains weeds, dust mites, cat, dog and cockroach. He currently receives 0.4mL of the RED vial (1/100). He started shots 2007 and reached maintenance in 2008.  He has a history of gout and is on colchicine as well as indomethacin as needed. He has a history of hypertension and hypercholesterolemia and is on amlodipine-valsartan. Otherwise, there have been no changes to his past medical history, surgical history, family history, or social history.    Review of Systems: a  14-point review of systems is pertinent for what is mentioned in HPI.  Otherwise, all other systems were negative. Constitutional: negative other than that listed in the HPI Eyes: negative other than that listed in the HPI Ears, nose, mouth, throat, and face: negative other than that listed in the HPI Respiratory: negative other than that listed in the HPI Cardiovascular: negative other than that listed in the HPI Gastrointestinal: negative other than that listed in the HPI Genitourinary: negative other than that listed in the HPI Integument: negative other than that listed in the HPI Hematologic: negative other than that listed in the HPI Musculoskeletal: negative other than that listed in the HPI Neurological: negative other than that listed in the HPI Allergy/Immunologic: negative other than that listed in the HPI    Objective:   Blood pressure 122/82, pulse 89, resp. rate 19, weight 253 lb 6.4 oz (114.9 kg), SpO2 97 %. Body mass index is 32.98 kg/m.   Physical Exam:  General: Alert, interactive, in no acute distress. Pleasant male. Interactive. Slow talking.  Eyes: No conjunctival injection present on the right, No conjunctival injection present on the left, PERRL bilaterally, No discharge on the right, No discharge on the left and No Horner-Trantas dots present Ears: Right TM unable to be visualized due to cerumen impaction and Left TM unable to be visualized due to cerumen impaction.  Nose/Throat: External nose within normal limits and septum midline, turbinates minimally edematous without discharge, post-pharynx mildly erythematous without cobblestoning in the posterior oropharynx. Tonsils 2+ without exudates Neck: Supple without thyromegaly. Lungs: Clear to auscultation without wheezing, rhonchi or rales. No increased work of breathing. Excellent air movement throughout all lung fields.  CV: Normal S1/S2, no murmurs. Capillary refill <2 seconds.  Skin: Warm and dry, without  lesions or rashes. Neuro:   Grossly intact. No focal deficits appreciated. Responsive to questions.   Diagnostic studies:   Spirometry: results abnormal (FEV1: 3.35/95%, FVC: 5.39/117%, FEV1/FVC: 62%).    Spirometry consistent with mild obstructive disease. Xopenex/Atrovent nebulizer treatment given in clinic with no improvement on the FEV1 or FVC. However there was a 165% improvement in FEF 25-75%.  Allergy Studies: none     Salvatore Marvel, MD [facility]

## 2017-01-28 ENCOUNTER — Ambulatory Visit (HOSPITAL_BASED_OUTPATIENT_CLINIC_OR_DEPARTMENT_OTHER)
Admission: RE | Admit: 2017-01-28 | Discharge: 2017-01-28 | Disposition: A | Discharge status: Home / Self Care | Payer: Medicare HMO | Source: Ambulatory Visit | Attending: Cardiovascular Disease | Admitting: Cardiovascular Disease

## 2017-01-28 ENCOUNTER — Encounter (HOSPITAL_BASED_OUTPATIENT_CLINIC_OR_DEPARTMENT_OTHER)
Admission: RE | Admit: 2017-01-28 | Discharge: 2017-01-28 | Disposition: A | Discharge status: Home / Self Care | Payer: Medicare HMO | Source: Ambulatory Visit | Attending: Cardiovascular Disease | Admitting: Cardiovascular Disease

## 2017-01-28 ENCOUNTER — Encounter (HOSPITAL_COMMUNITY)
Admission: RE | Admit: 2017-01-28 | Discharge: 2017-01-28 | Disposition: A | Discharge status: Home / Self Care | Payer: Medicare HMO | Source: Ambulatory Visit | Attending: Cardiovascular Disease | Admitting: Cardiovascular Disease

## 2017-01-28 ENCOUNTER — Encounter (HOSPITAL_COMMUNITY): Payer: Self-pay

## 2017-01-28 DIAGNOSIS — I119 Hypertensive heart disease without heart failure: Secondary | ICD-10-CM

## 2017-01-28 DIAGNOSIS — I071 Rheumatic tricuspid insufficiency: Secondary | ICD-10-CM

## 2017-01-28 DIAGNOSIS — I77810 Thoracic aortic ectasia: Secondary | ICD-10-CM

## 2017-01-28 DIAGNOSIS — I4891 Unspecified atrial fibrillation: Secondary | ICD-10-CM | POA: Diagnosis not present

## 2017-01-28 LAB — ECHOCARDIOGRAM COMPLETE
CHL CUP DOP CALC LVOT VTI: 12.7 cm
CHL CUP MV DEC (S): 187
CHL CUP RV SYS PRESS: 23 mmHg
CHL CUP STROKE VOLUME: 45 mL
CHL CUP TV REG PEAK VELOCITY: 224 cm/s
E decel time: 187 msec
FS: 37 % (ref 28–44)
IVS/LV PW RATIO, ED: 1.01
LA ID, A-P, ES: 54 mm
LA diam end sys: 54 mm
LA diam index: 2.18 cm/m2
LA vol A4C: 107 ml
LA vol: 118 mL
LAVOLIN: 47.7 mL/m2
LDCA: 3.8 cm2
LV PW d: 9.85 mm — AB (ref 0.6–1.1)
LV dias vol: 73 mL (ref 62–150)
LV sys vol index: 11 mL/m2
LV sys vol: 28 mL (ref 21–61)
LVDIAVOLIN: 30 mL/m2
LVOT SV: 48 mL
LVOT peak grad rest: 2 mmHg
LVOTD: 22 mm
LVOTPV: 69 cm/s
MVPG: 3 mmHg
MVPKEVEL: 81.1 m/s
RV TAPSE: 15.6 mm
Simpson's disk: 61
TR max vel: 224 cm/s

## 2017-01-28 LAB — NM MYOCAR MULTI W/SPECT W/WALL MOTION / EF
CHL CUP NUCLEAR SRS: 1
CHL CUP RESTING HR STRESS: 91 {beats}/min
CHL RATE OF PERCEIVED EXERTION: 13
CSEPEDS: 0 s
Estimated workload: 4.6 METS
Exercise duration (min): 3 min
LVDIAVOL: 91 mL (ref 62–150)
LVSYSVOL: 35 mL
MPHR: 152 {beats}/min
NUC STRESS TID: 1.05
Peak HR: 164 {beats}/min
Percent HR: 107 %
RATE: 0.35
SDS: 1
SSS: 2

## 2017-01-28 MED ORDER — SODIUM CHLORIDE 0.9% FLUSH
INTRAVENOUS | Status: AC
  Filled 2017-01-28: qty 180

## 2017-01-28 MED ORDER — REGADENOSON 0.4 MG/5ML IV SOLN
INTRAVENOUS | Status: AC
  Filled 2017-01-28: qty 5

## 2017-01-28 MED ORDER — TECHNETIUM TC 99M TETROFOSMIN IV KIT
10.0000 | PACK | Freq: Once | INTRAVENOUS | Status: AC | PRN
  Administered 2017-01-28: 10 via INTRAVENOUS

## 2017-01-28 MED ORDER — TECHNETIUM TC 99M TETROFOSMIN IV KIT
30.0000 | PACK | Freq: Once | INTRAVENOUS | Status: AC | PRN
  Administered 2017-01-28: 30 via INTRAVENOUS

## 2017-01-28 MED ORDER — SODIUM CHLORIDE 0.9% FLUSH
INTRAVENOUS | Status: AC
  Administered 2017-01-28: 10 mL via INTRAVENOUS
  Filled 2017-01-28: qty 10

## 2017-01-28 NOTE — Progress Notes (Signed)
*  PRELIMINARY RESULTS* Echocardiogram 2D Echocardiogram has been performed.  Samuel Germany 01/28/2017, 10:03 AM

## 2017-01-31 ENCOUNTER — Telehealth: Payer: Self-pay | Admitting: *Deleted

## 2017-01-31 ENCOUNTER — Telehealth: Payer: Self-pay

## 2017-01-31 NOTE — Telephone Encounter (Signed)
-----   Message from Josue Hector, MD sent at 01/30/2017  9:10 PM EDT ----- Normal myovue study with no evidence of ischemia or infarction

## 2017-01-31 NOTE — Telephone Encounter (Signed)
Called pt., no answer. Left message for pt to return call.  

## 2017-01-31 NOTE — Telephone Encounter (Signed)
-----   Message from Josue Hector, MD sent at 01/30/2017  9:09 PM EDT ----- EF normal no bad valves overall good

## 2017-01-31 NOTE — Telephone Encounter (Signed)
Called patient with test results. No answer. Left message to call back.  

## 2017-02-01 ENCOUNTER — Ambulatory Visit (INDEPENDENT_AMBULATORY_CARE_PROVIDER_SITE_OTHER): Payer: Medicare HMO | Admitting: *Deleted

## 2017-02-01 DIAGNOSIS — J3089 Other allergic rhinitis: Secondary | ICD-10-CM | POA: Diagnosis not present

## 2017-02-01 DIAGNOSIS — I10 Essential (primary) hypertension: Secondary | ICD-10-CM | POA: Diagnosis not present

## 2017-02-01 DIAGNOSIS — Z86010 Personal history of colonic polyps: Secondary | ICD-10-CM | POA: Diagnosis not present

## 2017-02-01 DIAGNOSIS — Z6832 Body mass index (BMI) 32.0-32.9, adult: Secondary | ICD-10-CM | POA: Diagnosis not present

## 2017-02-01 DIAGNOSIS — H6123 Impacted cerumen, bilateral: Secondary | ICD-10-CM | POA: Diagnosis not present

## 2017-02-01 DIAGNOSIS — I4891 Unspecified atrial fibrillation: Secondary | ICD-10-CM | POA: Diagnosis not present

## 2017-02-01 DIAGNOSIS — E790 Hyperuricemia without signs of inflammatory arthritis and tophaceous disease: Secondary | ICD-10-CM | POA: Diagnosis not present

## 2017-02-08 ENCOUNTER — Ambulatory Visit (INDEPENDENT_AMBULATORY_CARE_PROVIDER_SITE_OTHER): Payer: Medicare HMO | Admitting: *Deleted

## 2017-02-08 DIAGNOSIS — J3089 Other allergic rhinitis: Secondary | ICD-10-CM | POA: Diagnosis not present

## 2017-02-17 DIAGNOSIS — R69 Illness, unspecified: Secondary | ICD-10-CM | POA: Diagnosis not present

## 2017-03-08 ENCOUNTER — Ambulatory Visit (INDEPENDENT_AMBULATORY_CARE_PROVIDER_SITE_OTHER): Payer: Medicare HMO | Admitting: *Deleted

## 2017-03-08 DIAGNOSIS — J3089 Other allergic rhinitis: Secondary | ICD-10-CM

## 2017-03-22 DIAGNOSIS — Z23 Encounter for immunization: Secondary | ICD-10-CM | POA: Diagnosis not present

## 2017-03-22 DIAGNOSIS — Z6832 Body mass index (BMI) 32.0-32.9, adult: Secondary | ICD-10-CM | POA: Diagnosis not present

## 2017-03-22 DIAGNOSIS — H6120 Impacted cerumen, unspecified ear: Secondary | ICD-10-CM | POA: Diagnosis not present

## 2017-04-05 ENCOUNTER — Ambulatory Visit (INDEPENDENT_AMBULATORY_CARE_PROVIDER_SITE_OTHER): Payer: Medicare HMO

## 2017-04-05 DIAGNOSIS — J3089 Other allergic rhinitis: Secondary | ICD-10-CM | POA: Diagnosis not present

## 2017-04-13 DIAGNOSIS — H6123 Impacted cerumen, bilateral: Secondary | ICD-10-CM | POA: Diagnosis not present

## 2017-04-13 DIAGNOSIS — I10 Essential (primary) hypertension: Secondary | ICD-10-CM | POA: Diagnosis not present

## 2017-04-15 DIAGNOSIS — Z6835 Body mass index (BMI) 35.0-35.9, adult: Secondary | ICD-10-CM | POA: Diagnosis not present

## 2017-04-15 DIAGNOSIS — R944 Abnormal results of kidney function studies: Secondary | ICD-10-CM | POA: Diagnosis not present

## 2017-04-15 DIAGNOSIS — B029 Zoster without complications: Secondary | ICD-10-CM | POA: Diagnosis not present

## 2017-04-15 DIAGNOSIS — M1A9XX1 Chronic gout, unspecified, with tophus (tophi): Secondary | ICD-10-CM | POA: Diagnosis not present

## 2017-04-15 DIAGNOSIS — I10 Essential (primary) hypertension: Secondary | ICD-10-CM | POA: Diagnosis not present

## 2017-04-26 ENCOUNTER — Ambulatory Visit: Payer: Medicare HMO | Admitting: Allergy & Immunology

## 2017-05-03 ENCOUNTER — Ambulatory Visit (INDEPENDENT_AMBULATORY_CARE_PROVIDER_SITE_OTHER): Payer: Medicare HMO

## 2017-05-03 DIAGNOSIS — J3089 Other allergic rhinitis: Secondary | ICD-10-CM | POA: Diagnosis not present

## 2017-05-16 DIAGNOSIS — Z6835 Body mass index (BMI) 35.0-35.9, adult: Secondary | ICD-10-CM | POA: Diagnosis not present

## 2017-05-16 DIAGNOSIS — R42 Dizziness and giddiness: Secondary | ICD-10-CM | POA: Diagnosis not present

## 2017-05-16 DIAGNOSIS — R944 Abnormal results of kidney function studies: Secondary | ICD-10-CM | POA: Diagnosis not present

## 2017-05-16 DIAGNOSIS — H9202 Otalgia, left ear: Secondary | ICD-10-CM | POA: Diagnosis not present

## 2017-05-16 DIAGNOSIS — I10 Essential (primary) hypertension: Secondary | ICD-10-CM | POA: Diagnosis not present

## 2017-05-31 ENCOUNTER — Ambulatory Visit (INDEPENDENT_AMBULATORY_CARE_PROVIDER_SITE_OTHER): Payer: Medicare HMO

## 2017-05-31 DIAGNOSIS — J3089 Other allergic rhinitis: Secondary | ICD-10-CM

## 2017-06-16 ENCOUNTER — Other Ambulatory Visit: Payer: Self-pay

## 2017-06-16 DIAGNOSIS — J3089 Other allergic rhinitis: Secondary | ICD-10-CM | POA: Diagnosis not present

## 2017-06-20 DIAGNOSIS — R69 Illness, unspecified: Secondary | ICD-10-CM | POA: Diagnosis not present

## 2017-06-28 ENCOUNTER — Ambulatory Visit (INDEPENDENT_AMBULATORY_CARE_PROVIDER_SITE_OTHER): Payer: Medicare HMO

## 2017-06-28 DIAGNOSIS — J3089 Other allergic rhinitis: Secondary | ICD-10-CM | POA: Diagnosis not present

## 2017-06-28 NOTE — Progress Notes (Signed)
Immunotherapy   Patient Details  Name: EBB CARELOCK MRN: [id number] Date of Birth: 18-Jan-1951  06/28/2017  [name] spoke with Dr. Ernst Bowler and informed him that after his next injection in 4 weeks that he will stop his injection. Dr. Ernst Bowler stated that it was fine for to stop, the patient stated that he would call if he wanted to restart his injections.    Jonette Eva 06/28/2017, 4:29 PM

## 2017-07-26 ENCOUNTER — Ambulatory Visit (INDEPENDENT_AMBULATORY_CARE_PROVIDER_SITE_OTHER): Payer: Medicare HMO | Admitting: *Deleted

## 2017-07-26 DIAGNOSIS — J3089 Other allergic rhinitis: Secondary | ICD-10-CM | POA: Diagnosis not present

## 2017-08-23 ENCOUNTER — Ambulatory Visit (INDEPENDENT_AMBULATORY_CARE_PROVIDER_SITE_OTHER): Payer: Medicare HMO | Admitting: *Deleted

## 2017-08-23 DIAGNOSIS — J309 Allergic rhinitis, unspecified: Secondary | ICD-10-CM

## 2017-09-20 ENCOUNTER — Ambulatory Visit (INDEPENDENT_AMBULATORY_CARE_PROVIDER_SITE_OTHER): Payer: Medicare HMO | Admitting: *Deleted

## 2017-09-20 DIAGNOSIS — J309 Allergic rhinitis, unspecified: Secondary | ICD-10-CM | POA: Diagnosis not present

## 2017-10-04 ENCOUNTER — Ambulatory Visit (INDEPENDENT_AMBULATORY_CARE_PROVIDER_SITE_OTHER): Payer: Medicare HMO | Admitting: *Deleted

## 2017-10-04 DIAGNOSIS — J309 Allergic rhinitis, unspecified: Secondary | ICD-10-CM | POA: Diagnosis not present

## 2017-10-18 ENCOUNTER — Ambulatory Visit (INDEPENDENT_AMBULATORY_CARE_PROVIDER_SITE_OTHER): Payer: Medicare HMO | Admitting: *Deleted

## 2017-10-18 DIAGNOSIS — R69 Illness, unspecified: Secondary | ICD-10-CM | POA: Diagnosis not present

## 2017-10-18 DIAGNOSIS — J309 Allergic rhinitis, unspecified: Secondary | ICD-10-CM

## 2017-11-15 ENCOUNTER — Ambulatory Visit (INDEPENDENT_AMBULATORY_CARE_PROVIDER_SITE_OTHER): Payer: Medicare HMO | Admitting: *Deleted

## 2017-11-15 DIAGNOSIS — J309 Allergic rhinitis, unspecified: Secondary | ICD-10-CM

## 2017-11-18 DIAGNOSIS — R69 Illness, unspecified: Secondary | ICD-10-CM | POA: Diagnosis not present

## 2017-11-22 DIAGNOSIS — I10 Essential (primary) hypertension: Secondary | ICD-10-CM | POA: Diagnosis not present

## 2017-11-22 DIAGNOSIS — E782 Mixed hyperlipidemia: Secondary | ICD-10-CM | POA: Diagnosis not present

## 2017-12-06 DIAGNOSIS — Z6836 Body mass index (BMI) 36.0-36.9, adult: Secondary | ICD-10-CM | POA: Diagnosis not present

## 2017-12-06 DIAGNOSIS — M1A9XX1 Chronic gout, unspecified, with tophus (tophi): Secondary | ICD-10-CM | POA: Diagnosis not present

## 2017-12-06 DIAGNOSIS — R944 Abnormal results of kidney function studies: Secondary | ICD-10-CM | POA: Diagnosis not present

## 2017-12-06 DIAGNOSIS — I10 Essential (primary) hypertension: Secondary | ICD-10-CM | POA: Diagnosis not present

## 2017-12-13 ENCOUNTER — Ambulatory Visit (INDEPENDENT_AMBULATORY_CARE_PROVIDER_SITE_OTHER): Payer: Medicare HMO | Admitting: *Deleted

## 2017-12-13 DIAGNOSIS — J309 Allergic rhinitis, unspecified: Secondary | ICD-10-CM | POA: Diagnosis not present

## 2018-01-10 ENCOUNTER — Ambulatory Visit (INDEPENDENT_AMBULATORY_CARE_PROVIDER_SITE_OTHER): Payer: Medicare HMO

## 2018-01-10 DIAGNOSIS — J309 Allergic rhinitis, unspecified: Secondary | ICD-10-CM

## 2018-02-07 ENCOUNTER — Ambulatory Visit (INDEPENDENT_AMBULATORY_CARE_PROVIDER_SITE_OTHER): Payer: Medicare HMO | Admitting: *Deleted

## 2018-02-07 DIAGNOSIS — J309 Allergic rhinitis, unspecified: Secondary | ICD-10-CM

## 2018-03-07 ENCOUNTER — Ambulatory Visit (INDEPENDENT_AMBULATORY_CARE_PROVIDER_SITE_OTHER): Payer: Medicare HMO

## 2018-03-07 DIAGNOSIS — J309 Allergic rhinitis, unspecified: Secondary | ICD-10-CM | POA: Diagnosis not present

## 2018-03-07 DIAGNOSIS — R69 Illness, unspecified: Secondary | ICD-10-CM | POA: Diagnosis not present

## 2018-03-08 DIAGNOSIS — R42 Dizziness and giddiness: Secondary | ICD-10-CM | POA: Diagnosis not present

## 2018-03-08 DIAGNOSIS — M1A9XX1 Chronic gout, unspecified, with tophus (tophi): Secondary | ICD-10-CM | POA: Diagnosis not present

## 2018-03-08 DIAGNOSIS — R944 Abnormal results of kidney function studies: Secondary | ICD-10-CM | POA: Diagnosis not present

## 2018-03-08 DIAGNOSIS — I10 Essential (primary) hypertension: Secondary | ICD-10-CM | POA: Diagnosis not present

## 2018-03-16 DIAGNOSIS — R944 Abnormal results of kidney function studies: Secondary | ICD-10-CM | POA: Diagnosis not present

## 2018-03-16 DIAGNOSIS — M1A9XX1 Chronic gout, unspecified, with tophus (tophi): Secondary | ICD-10-CM | POA: Diagnosis not present

## 2018-03-16 DIAGNOSIS — Z6835 Body mass index (BMI) 35.0-35.9, adult: Secondary | ICD-10-CM | POA: Diagnosis not present

## 2018-03-16 DIAGNOSIS — Z0000 Encounter for general adult medical examination without abnormal findings: Secondary | ICD-10-CM | POA: Diagnosis not present

## 2018-03-16 DIAGNOSIS — I10 Essential (primary) hypertension: Secondary | ICD-10-CM | POA: Diagnosis not present

## 2018-03-27 DIAGNOSIS — H05019 Cellulitis of unspecified orbit: Secondary | ICD-10-CM | POA: Diagnosis not present

## 2018-04-03 DIAGNOSIS — R69 Illness, unspecified: Secondary | ICD-10-CM | POA: Diagnosis not present

## 2018-04-05 ENCOUNTER — Ambulatory Visit (INDEPENDENT_AMBULATORY_CARE_PROVIDER_SITE_OTHER): Payer: Medicare HMO

## 2018-04-05 DIAGNOSIS — J309 Allergic rhinitis, unspecified: Secondary | ICD-10-CM

## 2018-05-03 ENCOUNTER — Ambulatory Visit (INDEPENDENT_AMBULATORY_CARE_PROVIDER_SITE_OTHER): Payer: Medicare HMO | Admitting: *Deleted

## 2018-05-03 DIAGNOSIS — J309 Allergic rhinitis, unspecified: Secondary | ICD-10-CM

## 2018-05-31 ENCOUNTER — Ambulatory Visit (INDEPENDENT_AMBULATORY_CARE_PROVIDER_SITE_OTHER): Payer: Medicare HMO | Admitting: *Deleted

## 2018-05-31 DIAGNOSIS — J309 Allergic rhinitis, unspecified: Secondary | ICD-10-CM | POA: Diagnosis not present

## 2018-06-12 DIAGNOSIS — Z6836 Body mass index (BMI) 36.0-36.9, adult: Secondary | ICD-10-CM | POA: Diagnosis not present

## 2018-06-12 DIAGNOSIS — Z125 Encounter for screening for malignant neoplasm of prostate: Secondary | ICD-10-CM | POA: Diagnosis not present

## 2018-06-12 DIAGNOSIS — I10 Essential (primary) hypertension: Secondary | ICD-10-CM | POA: Diagnosis not present

## 2018-06-12 DIAGNOSIS — R944 Abnormal results of kidney function studies: Secondary | ICD-10-CM | POA: Diagnosis not present

## 2018-06-12 DIAGNOSIS — Z6835 Body mass index (BMI) 35.0-35.9, adult: Secondary | ICD-10-CM | POA: Diagnosis not present

## 2018-06-16 DIAGNOSIS — R944 Abnormal results of kidney function studies: Secondary | ICD-10-CM | POA: Diagnosis not present

## 2018-06-16 DIAGNOSIS — Z872 Personal history of diseases of the skin and subcutaneous tissue: Secondary | ICD-10-CM | POA: Diagnosis not present

## 2018-06-16 DIAGNOSIS — I10 Essential (primary) hypertension: Secondary | ICD-10-CM | POA: Diagnosis not present

## 2018-06-16 DIAGNOSIS — D509 Iron deficiency anemia, unspecified: Secondary | ICD-10-CM | POA: Diagnosis not present

## 2018-06-16 DIAGNOSIS — M1A9XX1 Chronic gout, unspecified, with tophus (tophi): Secondary | ICD-10-CM | POA: Diagnosis not present

## 2018-06-16 DIAGNOSIS — Z23 Encounter for immunization: Secondary | ICD-10-CM | POA: Diagnosis not present

## 2018-10-25 DIAGNOSIS — Z0000 Encounter for general adult medical examination without abnormal findings: Secondary | ICD-10-CM | POA: Diagnosis not present

## 2018-11-07 DIAGNOSIS — R69 Illness, unspecified: Secondary | ICD-10-CM | POA: Diagnosis not present

## 2018-12-06 DIAGNOSIS — R69 Illness, unspecified: Secondary | ICD-10-CM | POA: Diagnosis not present

## 2018-12-20 DIAGNOSIS — D509 Iron deficiency anemia, unspecified: Secondary | ICD-10-CM | POA: Diagnosis not present

## 2018-12-20 DIAGNOSIS — R944 Abnormal results of kidney function studies: Secondary | ICD-10-CM | POA: Diagnosis not present

## 2018-12-20 DIAGNOSIS — I10 Essential (primary) hypertension: Secondary | ICD-10-CM | POA: Diagnosis not present

## 2018-12-20 DIAGNOSIS — M1A9XX1 Chronic gout, unspecified, with tophus (tophi): Secondary | ICD-10-CM | POA: Diagnosis not present

## 2018-12-25 DIAGNOSIS — Z872 Personal history of diseases of the skin and subcutaneous tissue: Secondary | ICD-10-CM | POA: Diagnosis not present

## 2018-12-25 DIAGNOSIS — Z8679 Personal history of other diseases of the circulatory system: Secondary | ICD-10-CM | POA: Diagnosis not present

## 2018-12-25 DIAGNOSIS — R944 Abnormal results of kidney function studies: Secondary | ICD-10-CM | POA: Diagnosis not present

## 2018-12-25 DIAGNOSIS — M1A9XX1 Chronic gout, unspecified, with tophus (tophi): Secondary | ICD-10-CM | POA: Diagnosis not present

## 2018-12-25 DIAGNOSIS — I10 Essential (primary) hypertension: Secondary | ICD-10-CM | POA: Diagnosis not present

## 2018-12-25 DIAGNOSIS — E669 Obesity, unspecified: Secondary | ICD-10-CM | POA: Diagnosis not present

## 2018-12-25 DIAGNOSIS — D509 Iron deficiency anemia, unspecified: Secondary | ICD-10-CM | POA: Diagnosis not present

## 2019-02-27 DIAGNOSIS — I10 Essential (primary) hypertension: Secondary | ICD-10-CM | POA: Diagnosis not present

## 2019-02-27 DIAGNOSIS — D509 Iron deficiency anemia, unspecified: Secondary | ICD-10-CM | POA: Diagnosis not present

## 2019-02-27 DIAGNOSIS — R944 Abnormal results of kidney function studies: Secondary | ICD-10-CM | POA: Diagnosis not present

## 2019-02-27 DIAGNOSIS — M1A9XX1 Chronic gout, unspecified, with tophus (tophi): Secondary | ICD-10-CM | POA: Diagnosis not present

## 2019-03-05 DIAGNOSIS — R69 Illness, unspecified: Secondary | ICD-10-CM | POA: Diagnosis not present

## 2019-04-26 IMAGING — NM NM MYOCAR MULTI W/SPECT W/WALL MOTION & EF
2 series · 12 of 12 positions shown · non-contrast
Comparison: none

[Series 1: rest · 6.51mm/px · 6 of 64 frames shown]
[frame 6/64]
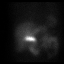
[frame 16/64]
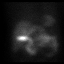
[frame 27/64]
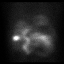
[frame 38/64]
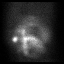
[frame 48/64]
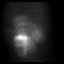
[frame 59/64]
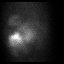

[Series 2: stress gated · 6.51mm/px · 6 of 64 frames shown]
[frame 6/64]
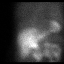
[frame 16/64]
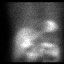
[frame 27/64]
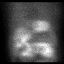
[frame 38/64]
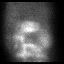
[frame 48/64]
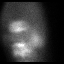
[frame 59/64]
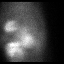

[12 of 12 positions shown; findings below may reference images not displayed]

Canned report from images found in remote index.

Refer to host system for actual result text.

## 2019-05-07 DIAGNOSIS — I10 Essential (primary) hypertension: Secondary | ICD-10-CM | POA: Diagnosis not present

## 2019-05-07 DIAGNOSIS — D509 Iron deficiency anemia, unspecified: Secondary | ICD-10-CM | POA: Diagnosis not present

## 2019-05-07 DIAGNOSIS — R944 Abnormal results of kidney function studies: Secondary | ICD-10-CM | POA: Diagnosis not present

## 2019-05-07 DIAGNOSIS — M1A9XX1 Chronic gout, unspecified, with tophus (tophi): Secondary | ICD-10-CM | POA: Diagnosis not present

## 2019-06-01 DIAGNOSIS — M109 Gout, unspecified: Secondary | ICD-10-CM | POA: Diagnosis not present

## 2019-06-01 DIAGNOSIS — Z6833 Body mass index (BMI) 33.0-33.9, adult: Secondary | ICD-10-CM | POA: Diagnosis not present

## 2019-06-01 DIAGNOSIS — Z87891 Personal history of nicotine dependence: Secondary | ICD-10-CM | POA: Diagnosis not present

## 2019-06-01 DIAGNOSIS — Z85828 Personal history of other malignant neoplasm of skin: Secondary | ICD-10-CM | POA: Diagnosis not present

## 2019-06-01 DIAGNOSIS — E669 Obesity, unspecified: Secondary | ICD-10-CM | POA: Diagnosis not present

## 2019-06-01 DIAGNOSIS — I10 Essential (primary) hypertension: Secondary | ICD-10-CM | POA: Diagnosis not present

## 2019-06-26 DIAGNOSIS — R69 Illness, unspecified: Secondary | ICD-10-CM | POA: Diagnosis not present

## 2019-06-27 DIAGNOSIS — M1A9XX1 Chronic gout, unspecified, with tophus (tophi): Secondary | ICD-10-CM | POA: Diagnosis not present

## 2019-06-27 DIAGNOSIS — R944 Abnormal results of kidney function studies: Secondary | ICD-10-CM | POA: Diagnosis not present

## 2019-06-27 DIAGNOSIS — D509 Iron deficiency anemia, unspecified: Secondary | ICD-10-CM | POA: Diagnosis not present

## 2019-06-27 DIAGNOSIS — E669 Obesity, unspecified: Secondary | ICD-10-CM | POA: Diagnosis not present

## 2019-06-27 DIAGNOSIS — Z125 Encounter for screening for malignant neoplasm of prostate: Secondary | ICD-10-CM | POA: Diagnosis not present

## 2019-06-27 DIAGNOSIS — I10 Essential (primary) hypertension: Secondary | ICD-10-CM | POA: Diagnosis not present

## 2019-07-02 DIAGNOSIS — Z8679 Personal history of other diseases of the circulatory system: Secondary | ICD-10-CM | POA: Diagnosis not present

## 2019-07-02 DIAGNOSIS — D509 Iron deficiency anemia, unspecified: Secondary | ICD-10-CM | POA: Diagnosis not present

## 2019-07-02 DIAGNOSIS — Z872 Personal history of diseases of the skin and subcutaneous tissue: Secondary | ICD-10-CM | POA: Diagnosis not present

## 2019-07-02 DIAGNOSIS — I10 Essential (primary) hypertension: Secondary | ICD-10-CM | POA: Diagnosis not present

## 2019-07-02 DIAGNOSIS — M1A9XX1 Chronic gout, unspecified, with tophus (tophi): Secondary | ICD-10-CM | POA: Diagnosis not present

## 2019-07-02 DIAGNOSIS — E875 Hyperkalemia: Secondary | ICD-10-CM | POA: Diagnosis not present

## 2019-07-02 DIAGNOSIS — R944 Abnormal results of kidney function studies: Secondary | ICD-10-CM | POA: Diagnosis not present

## 2019-07-02 DIAGNOSIS — E669 Obesity, unspecified: Secondary | ICD-10-CM | POA: Diagnosis not present

## 2019-07-03 ENCOUNTER — Ambulatory Visit: Payer: Medicare Other | Attending: Internal Medicine

## 2019-07-03 DIAGNOSIS — Z23 Encounter for immunization: Secondary | ICD-10-CM | POA: Insufficient documentation

## 2019-07-03 NOTE — Progress Notes (Signed)
   Covid-19 Vaccination Clinic  Name:  Geoffrey Patterson    MRN: [id number] DOB: 07-15-1950  07/03/2019  Mr. Geoffrey Patterson was observed post Covid-19 immunization for 15 minutes without incidence. He was provided with Vaccine Information Sheet and instruction to access the V-Safe system.   Mr. Geoffrey Patterson was instructed to call 911 with any severe reactions post vaccine: Marland Kitchen Difficulty breathing  . Swelling of your face and throat  . A fast heartbeat  . A bad rash all over your body  . Dizziness and weakness    Immunizations Administered    Name Date Dose VIS Date Route   Pfizer COVID-19 Vaccine 07/03/2019  1:40 PM 0.3 mL 05/25/2019 Intramuscular   Manufacturer: [place]   Lot: [id number]   [place]: [id number]

## 2019-07-24 ENCOUNTER — Ambulatory Visit: Payer: Medicare HMO | Attending: Internal Medicine

## 2019-07-24 DIAGNOSIS — Z23 Encounter for immunization: Secondary | ICD-10-CM | POA: Insufficient documentation

## 2019-07-24 NOTE — Progress Notes (Signed)
   Covid-19 Vaccination Clinic  Name:  Geoffrey Patterson    MRN: [id number] DOB: July 12, 1950  07/24/2019  [name] was observed post Covid-19 immunization for 15 minutes without incidence. He was provided with Vaccine Information Sheet and instruction to access the V-Safe system.   [name] was instructed to call 911 with any severe reactions post vaccine: Marland Kitchen Difficulty breathing  . Swelling of your face and throat  . A fast heartbeat  . A bad rash all over your body  . Dizziness and weakness    Immunizations Administered    Name Date Dose VIS Date Route   Pfizer COVID-19 Vaccine 07/24/2019  2:47 PM 0.3 mL 05/25/2019 Intramuscular   Manufacturer: [place]   Lot: [id number]   [place]: [id number]

## 2019-08-15 DIAGNOSIS — L820 Inflamed seborrheic keratosis: Secondary | ICD-10-CM | POA: Diagnosis not present

## 2019-08-15 DIAGNOSIS — C4442 Squamous cell carcinoma of skin of scalp and neck: Secondary | ICD-10-CM | POA: Diagnosis not present

## 2019-08-15 DIAGNOSIS — X32XXXA Exposure to sunlight, initial encounter: Secondary | ICD-10-CM | POA: Diagnosis not present

## 2019-08-15 DIAGNOSIS — L570 Actinic keratosis: Secondary | ICD-10-CM | POA: Diagnosis not present

## 2019-08-30 DIAGNOSIS — C4442 Squamous cell carcinoma of skin of scalp and neck: Secondary | ICD-10-CM | POA: Diagnosis not present

## 2019-09-13 DIAGNOSIS — C4442 Squamous cell carcinoma of skin of scalp and neck: Secondary | ICD-10-CM | POA: Diagnosis not present

## 2019-09-14 DIAGNOSIS — D509 Iron deficiency anemia, unspecified: Secondary | ICD-10-CM | POA: Diagnosis not present

## 2019-09-14 DIAGNOSIS — I10 Essential (primary) hypertension: Secondary | ICD-10-CM | POA: Diagnosis not present

## 2019-09-14 DIAGNOSIS — M1A9XX1 Chronic gout, unspecified, with tophus (tophi): Secondary | ICD-10-CM | POA: Diagnosis not present

## 2019-09-14 DIAGNOSIS — E669 Obesity, unspecified: Secondary | ICD-10-CM | POA: Diagnosis not present

## 2019-09-14 DIAGNOSIS — Z8679 Personal history of other diseases of the circulatory system: Secondary | ICD-10-CM | POA: Diagnosis not present

## 2019-09-14 DIAGNOSIS — Z872 Personal history of diseases of the skin and subcutaneous tissue: Secondary | ICD-10-CM | POA: Diagnosis not present

## 2019-09-14 DIAGNOSIS — R944 Abnormal results of kidney function studies: Secondary | ICD-10-CM | POA: Diagnosis not present

## 2019-10-15 DIAGNOSIS — Z872 Personal history of diseases of the skin and subcutaneous tissue: Secondary | ICD-10-CM | POA: Diagnosis not present

## 2019-10-15 DIAGNOSIS — D509 Iron deficiency anemia, unspecified: Secondary | ICD-10-CM | POA: Diagnosis not present

## 2019-10-15 DIAGNOSIS — I10 Essential (primary) hypertension: Secondary | ICD-10-CM | POA: Diagnosis not present

## 2019-10-15 DIAGNOSIS — M1A9XX1 Chronic gout, unspecified, with tophus (tophi): Secondary | ICD-10-CM | POA: Diagnosis not present

## 2019-10-15 DIAGNOSIS — E669 Obesity, unspecified: Secondary | ICD-10-CM | POA: Diagnosis not present

## 2019-10-15 DIAGNOSIS — Z8679 Personal history of other diseases of the circulatory system: Secondary | ICD-10-CM | POA: Diagnosis not present

## 2019-10-15 DIAGNOSIS — R944 Abnormal results of kidney function studies: Secondary | ICD-10-CM | POA: Diagnosis not present

## 2019-11-28 DIAGNOSIS — B029 Zoster without complications: Secondary | ICD-10-CM | POA: Diagnosis not present

## 2019-11-28 DIAGNOSIS — H05019 Cellulitis of unspecified orbit: Secondary | ICD-10-CM | POA: Diagnosis not present

## 2019-11-28 DIAGNOSIS — H6123 Impacted cerumen, bilateral: Secondary | ICD-10-CM | POA: Diagnosis not present

## 2019-11-28 DIAGNOSIS — H9202 Otalgia, left ear: Secondary | ICD-10-CM | POA: Diagnosis not present

## 2019-11-28 DIAGNOSIS — E669 Obesity, unspecified: Secondary | ICD-10-CM | POA: Diagnosis not present

## 2019-11-28 DIAGNOSIS — E875 Hyperkalemia: Secondary | ICD-10-CM | POA: Diagnosis not present

## 2019-11-28 DIAGNOSIS — E7849 Other hyperlipidemia: Secondary | ICD-10-CM | POA: Diagnosis not present

## 2019-11-28 DIAGNOSIS — E785 Hyperlipidemia, unspecified: Secondary | ICD-10-CM | POA: Diagnosis not present

## 2019-11-28 DIAGNOSIS — I10 Essential (primary) hypertension: Secondary | ICD-10-CM | POA: Diagnosis not present

## 2019-11-28 DIAGNOSIS — D509 Iron deficiency anemia, unspecified: Secondary | ICD-10-CM | POA: Diagnosis not present

## 2019-11-30 DIAGNOSIS — I10 Essential (primary) hypertension: Secondary | ICD-10-CM | POA: Diagnosis not present

## 2019-11-30 DIAGNOSIS — Z8679 Personal history of other diseases of the circulatory system: Secondary | ICD-10-CM | POA: Diagnosis not present

## 2019-11-30 DIAGNOSIS — J309 Allergic rhinitis, unspecified: Secondary | ICD-10-CM | POA: Diagnosis not present

## 2019-11-30 DIAGNOSIS — R944 Abnormal results of kidney function studies: Secondary | ICD-10-CM | POA: Diagnosis not present

## 2019-11-30 DIAGNOSIS — Z872 Personal history of diseases of the skin and subcutaneous tissue: Secondary | ICD-10-CM | POA: Diagnosis not present

## 2019-11-30 DIAGNOSIS — M1A9XX1 Chronic gout, unspecified, with tophus (tophi): Secondary | ICD-10-CM | POA: Diagnosis not present

## 2019-11-30 DIAGNOSIS — E875 Hyperkalemia: Secondary | ICD-10-CM | POA: Diagnosis not present

## 2019-11-30 DIAGNOSIS — D509 Iron deficiency anemia, unspecified: Secondary | ICD-10-CM | POA: Diagnosis not present

## 2019-11-30 DIAGNOSIS — E669 Obesity, unspecified: Secondary | ICD-10-CM | POA: Diagnosis not present

## 2019-12-11 DIAGNOSIS — L570 Actinic keratosis: Secondary | ICD-10-CM | POA: Diagnosis not present

## 2019-12-14 DIAGNOSIS — Z8679 Personal history of other diseases of the circulatory system: Secondary | ICD-10-CM | POA: Diagnosis not present

## 2019-12-14 DIAGNOSIS — R944 Abnormal results of kidney function studies: Secondary | ICD-10-CM | POA: Diagnosis not present

## 2019-12-14 DIAGNOSIS — E669 Obesity, unspecified: Secondary | ICD-10-CM | POA: Diagnosis not present

## 2019-12-14 DIAGNOSIS — Z872 Personal history of diseases of the skin and subcutaneous tissue: Secondary | ICD-10-CM | POA: Diagnosis not present

## 2019-12-14 DIAGNOSIS — M1A9XX1 Chronic gout, unspecified, with tophus (tophi): Secondary | ICD-10-CM | POA: Diagnosis not present

## 2019-12-14 DIAGNOSIS — D509 Iron deficiency anemia, unspecified: Secondary | ICD-10-CM | POA: Diagnosis not present

## 2019-12-14 DIAGNOSIS — E875 Hyperkalemia: Secondary | ICD-10-CM | POA: Diagnosis not present

## 2019-12-14 DIAGNOSIS — I10 Essential (primary) hypertension: Secondary | ICD-10-CM | POA: Diagnosis not present

## 2019-12-14 DIAGNOSIS — J309 Allergic rhinitis, unspecified: Secondary | ICD-10-CM | POA: Diagnosis not present

## 2020-01-14 DIAGNOSIS — J309 Allergic rhinitis, unspecified: Secondary | ICD-10-CM | POA: Diagnosis not present

## 2020-01-14 DIAGNOSIS — D509 Iron deficiency anemia, unspecified: Secondary | ICD-10-CM | POA: Diagnosis not present

## 2020-01-14 DIAGNOSIS — Z872 Personal history of diseases of the skin and subcutaneous tissue: Secondary | ICD-10-CM | POA: Diagnosis not present

## 2020-01-14 DIAGNOSIS — R944 Abnormal results of kidney function studies: Secondary | ICD-10-CM | POA: Diagnosis not present

## 2020-01-14 DIAGNOSIS — E875 Hyperkalemia: Secondary | ICD-10-CM | POA: Diagnosis not present

## 2020-01-14 DIAGNOSIS — I10 Essential (primary) hypertension: Secondary | ICD-10-CM | POA: Diagnosis not present

## 2020-01-14 DIAGNOSIS — E669 Obesity, unspecified: Secondary | ICD-10-CM | POA: Diagnosis not present

## 2020-01-14 DIAGNOSIS — Z8679 Personal history of other diseases of the circulatory system: Secondary | ICD-10-CM | POA: Diagnosis not present

## 2020-01-14 DIAGNOSIS — M1A9XX1 Chronic gout, unspecified, with tophus (tophi): Secondary | ICD-10-CM | POA: Diagnosis not present

## 2020-01-15 DIAGNOSIS — R69 Illness, unspecified: Secondary | ICD-10-CM | POA: Diagnosis not present

## 2020-03-05 DIAGNOSIS — E669 Obesity, unspecified: Secondary | ICD-10-CM | POA: Diagnosis not present

## 2020-03-05 DIAGNOSIS — D509 Iron deficiency anemia, unspecified: Secondary | ICD-10-CM | POA: Diagnosis not present

## 2020-03-05 DIAGNOSIS — J309 Allergic rhinitis, unspecified: Secondary | ICD-10-CM | POA: Diagnosis not present

## 2020-03-05 DIAGNOSIS — M1A9XX1 Chronic gout, unspecified, with tophus (tophi): Secondary | ICD-10-CM | POA: Diagnosis not present

## 2020-03-05 DIAGNOSIS — E875 Hyperkalemia: Secondary | ICD-10-CM | POA: Diagnosis not present

## 2020-03-05 DIAGNOSIS — Z872 Personal history of diseases of the skin and subcutaneous tissue: Secondary | ICD-10-CM | POA: Diagnosis not present

## 2020-03-05 DIAGNOSIS — R944 Abnormal results of kidney function studies: Secondary | ICD-10-CM | POA: Diagnosis not present

## 2020-03-05 DIAGNOSIS — I10 Essential (primary) hypertension: Secondary | ICD-10-CM | POA: Diagnosis not present

## 2020-03-05 DIAGNOSIS — Z8679 Personal history of other diseases of the circulatory system: Secondary | ICD-10-CM | POA: Diagnosis not present

## 2020-03-20 ENCOUNTER — Ambulatory Visit: Payer: Medicare HMO | Attending: Internal Medicine

## 2020-03-20 DIAGNOSIS — Z23 Encounter for immunization: Secondary | ICD-10-CM

## 2020-03-20 NOTE — Progress Notes (Signed)
   Covid-19 Vaccination Clinic  Name:  UMAIR ROSILES    MRN: [id number] DOB: 02/14/1951  03/20/2020  Mr. Guggenheim was observed post Covid-19 immunization for 15 minutes without incident. He was provided with Vaccine Information Sheet and instruction to access the V-Safe system.   Mr. Gloster was instructed to call 911 with any severe reactions post vaccine: Marland Kitchen Difficulty breathing  . Swelling of face and throat  . A fast heartbeat  . A bad rash all over body  . Dizziness and weakness

## 2020-04-02 DIAGNOSIS — Z872 Personal history of diseases of the skin and subcutaneous tissue: Secondary | ICD-10-CM | POA: Diagnosis not present

## 2020-04-02 DIAGNOSIS — D509 Iron deficiency anemia, unspecified: Secondary | ICD-10-CM | POA: Diagnosis not present

## 2020-04-02 DIAGNOSIS — M1A9XX1 Chronic gout, unspecified, with tophus (tophi): Secondary | ICD-10-CM | POA: Diagnosis not present

## 2020-04-02 DIAGNOSIS — I10 Essential (primary) hypertension: Secondary | ICD-10-CM | POA: Diagnosis not present

## 2020-04-02 DIAGNOSIS — E875 Hyperkalemia: Secondary | ICD-10-CM | POA: Diagnosis not present

## 2020-04-02 DIAGNOSIS — Z8679 Personal history of other diseases of the circulatory system: Secondary | ICD-10-CM | POA: Diagnosis not present

## 2020-04-02 DIAGNOSIS — E669 Obesity, unspecified: Secondary | ICD-10-CM | POA: Diagnosis not present

## 2020-04-02 DIAGNOSIS — R944 Abnormal results of kidney function studies: Secondary | ICD-10-CM | POA: Diagnosis not present

## 2020-04-07 DIAGNOSIS — R69 Illness, unspecified: Secondary | ICD-10-CM | POA: Diagnosis not present

## 2020-04-21 DIAGNOSIS — Z85828 Personal history of other malignant neoplasm of skin: Secondary | ICD-10-CM | POA: Diagnosis not present

## 2020-04-21 DIAGNOSIS — Z87891 Personal history of nicotine dependence: Secondary | ICD-10-CM | POA: Diagnosis not present

## 2020-04-21 DIAGNOSIS — B009 Herpesviral infection, unspecified: Secondary | ICD-10-CM | POA: Diagnosis not present

## 2020-04-21 DIAGNOSIS — E785 Hyperlipidemia, unspecified: Secondary | ICD-10-CM | POA: Diagnosis not present

## 2020-04-21 DIAGNOSIS — E669 Obesity, unspecified: Secondary | ICD-10-CM | POA: Diagnosis not present

## 2020-04-21 DIAGNOSIS — Z6832 Body mass index (BMI) 32.0-32.9, adult: Secondary | ICD-10-CM | POA: Diagnosis not present

## 2020-04-21 DIAGNOSIS — I10 Essential (primary) hypertension: Secondary | ICD-10-CM | POA: Diagnosis not present

## 2020-04-21 DIAGNOSIS — M109 Gout, unspecified: Secondary | ICD-10-CM | POA: Diagnosis not present

## 2020-04-21 DIAGNOSIS — Z8249 Family history of ischemic heart disease and other diseases of the circulatory system: Secondary | ICD-10-CM | POA: Diagnosis not present

## 2020-05-06 DIAGNOSIS — I10 Essential (primary) hypertension: Secondary | ICD-10-CM | POA: Diagnosis not present

## 2020-05-06 DIAGNOSIS — D509 Iron deficiency anemia, unspecified: Secondary | ICD-10-CM | POA: Diagnosis not present

## 2020-05-06 DIAGNOSIS — Z872 Personal history of diseases of the skin and subcutaneous tissue: Secondary | ICD-10-CM | POA: Diagnosis not present

## 2020-05-06 DIAGNOSIS — M1A9XX1 Chronic gout, unspecified, with tophus (tophi): Secondary | ICD-10-CM | POA: Diagnosis not present

## 2020-05-06 DIAGNOSIS — Z8679 Personal history of other diseases of the circulatory system: Secondary | ICD-10-CM | POA: Diagnosis not present

## 2020-05-06 DIAGNOSIS — R944 Abnormal results of kidney function studies: Secondary | ICD-10-CM | POA: Diagnosis not present

## 2020-05-06 DIAGNOSIS — E669 Obesity, unspecified: Secondary | ICD-10-CM | POA: Diagnosis not present

## 2020-05-06 DIAGNOSIS — E875 Hyperkalemia: Secondary | ICD-10-CM | POA: Diagnosis not present

## 2020-06-04 DIAGNOSIS — Z0000 Encounter for general adult medical examination without abnormal findings: Secondary | ICD-10-CM | POA: Diagnosis not present

## 2020-06-04 DIAGNOSIS — R944 Abnormal results of kidney function studies: Secondary | ICD-10-CM | POA: Diagnosis not present

## 2020-06-04 DIAGNOSIS — E875 Hyperkalemia: Secondary | ICD-10-CM | POA: Diagnosis not present

## 2020-06-04 DIAGNOSIS — E785 Hyperlipidemia, unspecified: Secondary | ICD-10-CM | POA: Diagnosis not present

## 2020-06-04 DIAGNOSIS — D509 Iron deficiency anemia, unspecified: Secondary | ICD-10-CM | POA: Diagnosis not present

## 2020-06-04 DIAGNOSIS — I10 Essential (primary) hypertension: Secondary | ICD-10-CM | POA: Diagnosis not present

## 2020-06-04 DIAGNOSIS — H6123 Impacted cerumen, bilateral: Secondary | ICD-10-CM | POA: Diagnosis not present

## 2020-06-04 DIAGNOSIS — B029 Zoster without complications: Secondary | ICD-10-CM | POA: Diagnosis not present

## 2020-06-04 DIAGNOSIS — J309 Allergic rhinitis, unspecified: Secondary | ICD-10-CM | POA: Diagnosis not present

## 2020-06-04 DIAGNOSIS — Z6836 Body mass index (BMI) 36.0-36.9, adult: Secondary | ICD-10-CM | POA: Diagnosis not present

## 2020-06-04 DIAGNOSIS — E7849 Other hyperlipidemia: Secondary | ICD-10-CM | POA: Diagnosis not present

## 2020-06-10 DIAGNOSIS — Z8679 Personal history of other diseases of the circulatory system: Secondary | ICD-10-CM | POA: Diagnosis not present

## 2020-06-10 DIAGNOSIS — I10 Essential (primary) hypertension: Secondary | ICD-10-CM | POA: Diagnosis not present

## 2020-06-10 DIAGNOSIS — M1A9XX1 Chronic gout, unspecified, with tophus (tophi): Secondary | ICD-10-CM | POA: Diagnosis not present

## 2020-06-10 DIAGNOSIS — E875 Hyperkalemia: Secondary | ICD-10-CM | POA: Diagnosis not present

## 2020-06-10 DIAGNOSIS — D509 Iron deficiency anemia, unspecified: Secondary | ICD-10-CM | POA: Diagnosis not present

## 2020-06-10 DIAGNOSIS — Z872 Personal history of diseases of the skin and subcutaneous tissue: Secondary | ICD-10-CM | POA: Diagnosis not present

## 2020-06-10 DIAGNOSIS — E669 Obesity, unspecified: Secondary | ICD-10-CM | POA: Diagnosis not present

## 2020-06-10 DIAGNOSIS — R944 Abnormal results of kidney function studies: Secondary | ICD-10-CM | POA: Diagnosis not present

## 2020-06-10 DIAGNOSIS — J309 Allergic rhinitis, unspecified: Secondary | ICD-10-CM | POA: Diagnosis not present

## 2020-06-13 DIAGNOSIS — Z8679 Personal history of other diseases of the circulatory system: Secondary | ICD-10-CM | POA: Diagnosis not present

## 2020-06-13 DIAGNOSIS — M1A9XX1 Chronic gout, unspecified, with tophus (tophi): Secondary | ICD-10-CM | POA: Diagnosis not present

## 2020-06-13 DIAGNOSIS — I10 Essential (primary) hypertension: Secondary | ICD-10-CM | POA: Diagnosis not present

## 2020-06-13 DIAGNOSIS — Z872 Personal history of diseases of the skin and subcutaneous tissue: Secondary | ICD-10-CM | POA: Diagnosis not present

## 2020-06-13 DIAGNOSIS — D509 Iron deficiency anemia, unspecified: Secondary | ICD-10-CM | POA: Diagnosis not present

## 2020-06-13 DIAGNOSIS — E875 Hyperkalemia: Secondary | ICD-10-CM | POA: Diagnosis not present

## 2020-06-13 DIAGNOSIS — R944 Abnormal results of kidney function studies: Secondary | ICD-10-CM | POA: Diagnosis not present

## 2020-06-13 DIAGNOSIS — E669 Obesity, unspecified: Secondary | ICD-10-CM | POA: Diagnosis not present

## 2020-07-12 DIAGNOSIS — J309 Allergic rhinitis, unspecified: Secondary | ICD-10-CM | POA: Diagnosis not present

## 2020-07-12 DIAGNOSIS — E875 Hyperkalemia: Secondary | ICD-10-CM | POA: Diagnosis not present

## 2020-07-12 DIAGNOSIS — I10 Essential (primary) hypertension: Secondary | ICD-10-CM | POA: Diagnosis not present

## 2020-07-12 DIAGNOSIS — E669 Obesity, unspecified: Secondary | ICD-10-CM | POA: Diagnosis not present

## 2020-07-12 DIAGNOSIS — Z872 Personal history of diseases of the skin and subcutaneous tissue: Secondary | ICD-10-CM | POA: Diagnosis not present

## 2020-07-12 DIAGNOSIS — Z8679 Personal history of other diseases of the circulatory system: Secondary | ICD-10-CM | POA: Diagnosis not present

## 2020-07-12 DIAGNOSIS — M1A9XX1 Chronic gout, unspecified, with tophus (tophi): Secondary | ICD-10-CM | POA: Diagnosis not present

## 2020-07-12 DIAGNOSIS — D509 Iron deficiency anemia, unspecified: Secondary | ICD-10-CM | POA: Diagnosis not present

## 2020-07-25 DIAGNOSIS — Z85828 Personal history of other malignant neoplasm of skin: Secondary | ICD-10-CM | POA: Diagnosis not present

## 2020-07-25 DIAGNOSIS — C4442 Squamous cell carcinoma of skin of scalp and neck: Secondary | ICD-10-CM | POA: Diagnosis not present

## 2020-07-25 DIAGNOSIS — L570 Actinic keratosis: Secondary | ICD-10-CM | POA: Diagnosis not present

## 2020-07-25 DIAGNOSIS — L821 Other seborrheic keratosis: Secondary | ICD-10-CM | POA: Diagnosis not present

## 2020-07-25 DIAGNOSIS — L905 Scar conditions and fibrosis of skin: Secondary | ICD-10-CM | POA: Diagnosis not present

## 2020-07-25 DIAGNOSIS — D485 Neoplasm of uncertain behavior of skin: Secondary | ICD-10-CM | POA: Diagnosis not present

## 2020-08-04 DIAGNOSIS — C44329 Squamous cell carcinoma of skin of other parts of face: Secondary | ICD-10-CM | POA: Diagnosis not present

## 2020-08-11 DIAGNOSIS — D509 Iron deficiency anemia, unspecified: Secondary | ICD-10-CM | POA: Diagnosis not present

## 2020-08-11 DIAGNOSIS — Z872 Personal history of diseases of the skin and subcutaneous tissue: Secondary | ICD-10-CM | POA: Diagnosis not present

## 2020-08-11 DIAGNOSIS — E669 Obesity, unspecified: Secondary | ICD-10-CM | POA: Diagnosis not present

## 2020-08-11 DIAGNOSIS — J309 Allergic rhinitis, unspecified: Secondary | ICD-10-CM | POA: Diagnosis not present

## 2020-08-11 DIAGNOSIS — Z8679 Personal history of other diseases of the circulatory system: Secondary | ICD-10-CM | POA: Diagnosis not present

## 2020-08-11 DIAGNOSIS — M1A9XX1 Chronic gout, unspecified, with tophus (tophi): Secondary | ICD-10-CM | POA: Diagnosis not present

## 2020-08-11 DIAGNOSIS — I10 Essential (primary) hypertension: Secondary | ICD-10-CM | POA: Diagnosis not present

## 2020-08-11 DIAGNOSIS — E875 Hyperkalemia: Secondary | ICD-10-CM | POA: Diagnosis not present

## 2020-09-10 DIAGNOSIS — D509 Iron deficiency anemia, unspecified: Secondary | ICD-10-CM | POA: Diagnosis not present

## 2020-09-10 DIAGNOSIS — J309 Allergic rhinitis, unspecified: Secondary | ICD-10-CM | POA: Diagnosis not present

## 2020-09-10 DIAGNOSIS — I10 Essential (primary) hypertension: Secondary | ICD-10-CM | POA: Diagnosis not present

## 2020-09-10 DIAGNOSIS — E669 Obesity, unspecified: Secondary | ICD-10-CM | POA: Diagnosis not present

## 2020-09-10 DIAGNOSIS — E875 Hyperkalemia: Secondary | ICD-10-CM | POA: Diagnosis not present

## 2020-11-27 DIAGNOSIS — M109 Gout, unspecified: Secondary | ICD-10-CM | POA: Diagnosis not present

## 2020-11-27 DIAGNOSIS — I10 Essential (primary) hypertension: Secondary | ICD-10-CM | POA: Diagnosis not present

## 2020-12-02 DIAGNOSIS — J309 Allergic rhinitis, unspecified: Secondary | ICD-10-CM | POA: Diagnosis not present

## 2020-12-02 DIAGNOSIS — D509 Iron deficiency anemia, unspecified: Secondary | ICD-10-CM | POA: Diagnosis not present

## 2020-12-02 DIAGNOSIS — Z125 Encounter for screening for malignant neoplasm of prostate: Secondary | ICD-10-CM | POA: Diagnosis not present

## 2020-12-02 DIAGNOSIS — M1A9XX1 Chronic gout, unspecified, with tophus (tophi): Secondary | ICD-10-CM | POA: Diagnosis not present

## 2020-12-02 DIAGNOSIS — R944 Abnormal results of kidney function studies: Secondary | ICD-10-CM | POA: Diagnosis not present

## 2020-12-02 DIAGNOSIS — E782 Mixed hyperlipidemia: Secondary | ICD-10-CM | POA: Diagnosis not present

## 2020-12-02 DIAGNOSIS — Z85828 Personal history of other malignant neoplasm of skin: Secondary | ICD-10-CM | POA: Diagnosis not present

## 2020-12-02 DIAGNOSIS — E669 Obesity, unspecified: Secondary | ICD-10-CM | POA: Diagnosis not present

## 2020-12-02 DIAGNOSIS — I10 Essential (primary) hypertension: Secondary | ICD-10-CM | POA: Diagnosis not present

## 2020-12-02 DIAGNOSIS — E875 Hyperkalemia: Secondary | ICD-10-CM | POA: Diagnosis not present

## 2020-12-11 DIAGNOSIS — E1165 Type 2 diabetes mellitus with hyperglycemia: Secondary | ICD-10-CM | POA: Diagnosis not present

## 2020-12-11 DIAGNOSIS — I10 Essential (primary) hypertension: Secondary | ICD-10-CM | POA: Diagnosis not present

## 2021-01-07 DIAGNOSIS — I10 Essential (primary) hypertension: Secondary | ICD-10-CM | POA: Diagnosis not present

## 2021-01-07 DIAGNOSIS — M109 Gout, unspecified: Secondary | ICD-10-CM | POA: Diagnosis not present

## 2021-01-07 DIAGNOSIS — E669 Obesity, unspecified: Secondary | ICD-10-CM | POA: Diagnosis not present

## 2021-01-07 DIAGNOSIS — B009 Herpesviral infection, unspecified: Secondary | ICD-10-CM | POA: Diagnosis not present

## 2021-01-07 DIAGNOSIS — Z6833 Body mass index (BMI) 33.0-33.9, adult: Secondary | ICD-10-CM | POA: Diagnosis not present

## 2021-01-07 DIAGNOSIS — J309 Allergic rhinitis, unspecified: Secondary | ICD-10-CM | POA: Diagnosis not present

## 2021-01-07 DIAGNOSIS — I499 Cardiac arrhythmia, unspecified: Secondary | ICD-10-CM | POA: Diagnosis not present

## 2021-01-07 DIAGNOSIS — E785 Hyperlipidemia, unspecified: Secondary | ICD-10-CM | POA: Diagnosis not present

## 2021-01-07 DIAGNOSIS — Z8249 Family history of ischemic heart disease and other diseases of the circulatory system: Secondary | ICD-10-CM | POA: Diagnosis not present

## 2021-01-07 DIAGNOSIS — Z823 Family history of stroke: Secondary | ICD-10-CM | POA: Diagnosis not present

## 2021-01-07 DIAGNOSIS — Z008 Encounter for other general examination: Secondary | ICD-10-CM | POA: Diagnosis not present

## 2021-01-11 DIAGNOSIS — I10 Essential (primary) hypertension: Secondary | ICD-10-CM | POA: Diagnosis not present

## 2021-01-11 DIAGNOSIS — E1165 Type 2 diabetes mellitus with hyperglycemia: Secondary | ICD-10-CM | POA: Diagnosis not present

## 2021-02-11 DIAGNOSIS — I10 Essential (primary) hypertension: Secondary | ICD-10-CM | POA: Diagnosis not present

## 2021-02-11 DIAGNOSIS — E1165 Type 2 diabetes mellitus with hyperglycemia: Secondary | ICD-10-CM | POA: Diagnosis not present

## 2021-03-13 DIAGNOSIS — Z23 Encounter for immunization: Secondary | ICD-10-CM | POA: Diagnosis not present

## 2021-04-13 DIAGNOSIS — E1165 Type 2 diabetes mellitus with hyperglycemia: Secondary | ICD-10-CM | POA: Diagnosis not present

## 2021-04-13 DIAGNOSIS — I10 Essential (primary) hypertension: Secondary | ICD-10-CM | POA: Diagnosis not present

## 2021-05-13 DIAGNOSIS — I10 Essential (primary) hypertension: Secondary | ICD-10-CM | POA: Diagnosis not present

## 2021-05-13 DIAGNOSIS — E1165 Type 2 diabetes mellitus with hyperglycemia: Secondary | ICD-10-CM | POA: Diagnosis not present

## 2021-05-15 DIAGNOSIS — R42 Dizziness and giddiness: Secondary | ICD-10-CM | POA: Diagnosis not present

## 2021-06-04 DIAGNOSIS — E782 Mixed hyperlipidemia: Secondary | ICD-10-CM | POA: Diagnosis not present

## 2021-06-04 DIAGNOSIS — Z125 Encounter for screening for malignant neoplasm of prostate: Secondary | ICD-10-CM | POA: Diagnosis not present

## 2021-06-04 DIAGNOSIS — M1A9XX1 Chronic gout, unspecified, with tophus (tophi): Secondary | ICD-10-CM | POA: Diagnosis not present

## 2021-06-10 DIAGNOSIS — I10 Essential (primary) hypertension: Secondary | ICD-10-CM | POA: Diagnosis not present

## 2021-06-10 DIAGNOSIS — E669 Obesity, unspecified: Secondary | ICD-10-CM | POA: Diagnosis not present

## 2021-06-10 DIAGNOSIS — R944 Abnormal results of kidney function studies: Secondary | ICD-10-CM | POA: Diagnosis not present

## 2021-06-10 DIAGNOSIS — E782 Mixed hyperlipidemia: Secondary | ICD-10-CM | POA: Diagnosis not present

## 2021-06-10 DIAGNOSIS — J309 Allergic rhinitis, unspecified: Secondary | ICD-10-CM | POA: Diagnosis not present

## 2021-06-10 DIAGNOSIS — Z0001 Encounter for general adult medical examination with abnormal findings: Secondary | ICD-10-CM | POA: Diagnosis not present

## 2021-06-10 DIAGNOSIS — D509 Iron deficiency anemia, unspecified: Secondary | ICD-10-CM | POA: Diagnosis not present

## 2021-06-10 DIAGNOSIS — Z85828 Personal history of other malignant neoplasm of skin: Secondary | ICD-10-CM | POA: Diagnosis not present

## 2021-06-10 DIAGNOSIS — E875 Hyperkalemia: Secondary | ICD-10-CM | POA: Diagnosis not present

## 2021-06-10 DIAGNOSIS — M1A9XX1 Chronic gout, unspecified, with tophus (tophi): Secondary | ICD-10-CM | POA: Diagnosis not present

## 2021-06-26 DIAGNOSIS — M109 Gout, unspecified: Secondary | ICD-10-CM | POA: Diagnosis not present

## 2021-06-26 DIAGNOSIS — Z823 Family history of stroke: Secondary | ICD-10-CM | POA: Diagnosis not present

## 2021-06-26 DIAGNOSIS — E669 Obesity, unspecified: Secondary | ICD-10-CM | POA: Diagnosis not present

## 2021-06-26 DIAGNOSIS — Z8249 Family history of ischemic heart disease and other diseases of the circulatory system: Secondary | ICD-10-CM | POA: Diagnosis not present

## 2021-06-26 DIAGNOSIS — J301 Allergic rhinitis due to pollen: Secondary | ICD-10-CM | POA: Diagnosis not present

## 2021-06-26 DIAGNOSIS — Z6834 Body mass index (BMI) 34.0-34.9, adult: Secondary | ICD-10-CM | POA: Diagnosis not present

## 2021-06-26 DIAGNOSIS — I10 Essential (primary) hypertension: Secondary | ICD-10-CM | POA: Diagnosis not present

## 2021-06-26 DIAGNOSIS — I499 Cardiac arrhythmia, unspecified: Secondary | ICD-10-CM | POA: Diagnosis not present

## 2021-06-26 DIAGNOSIS — B009 Herpesviral infection, unspecified: Secondary | ICD-10-CM | POA: Diagnosis not present

## 2021-06-26 DIAGNOSIS — E785 Hyperlipidemia, unspecified: Secondary | ICD-10-CM | POA: Diagnosis not present

## 2021-06-26 DIAGNOSIS — Z791 Long term (current) use of non-steroidal anti-inflammatories (NSAID): Secondary | ICD-10-CM | POA: Diagnosis not present

## 2021-06-26 DIAGNOSIS — Z008 Encounter for other general examination: Secondary | ICD-10-CM | POA: Diagnosis not present

## 2021-06-26 DIAGNOSIS — M1990 Unspecified osteoarthritis, unspecified site: Secondary | ICD-10-CM | POA: Diagnosis not present

## 2021-07-14 DIAGNOSIS — I10 Essential (primary) hypertension: Secondary | ICD-10-CM | POA: Diagnosis not present

## 2021-07-14 DIAGNOSIS — E1165 Type 2 diabetes mellitus with hyperglycemia: Secondary | ICD-10-CM | POA: Diagnosis not present

## 2021-10-11 DIAGNOSIS — E782 Mixed hyperlipidemia: Secondary | ICD-10-CM | POA: Diagnosis not present

## 2021-10-11 DIAGNOSIS — I10 Essential (primary) hypertension: Secondary | ICD-10-CM | POA: Diagnosis not present

## 2021-10-19 DIAGNOSIS — Z09 Encounter for follow-up examination after completed treatment for conditions other than malignant neoplasm: Secondary | ICD-10-CM | POA: Diagnosis not present

## 2021-10-19 DIAGNOSIS — D485 Neoplasm of uncertain behavior of skin: Secondary | ICD-10-CM | POA: Diagnosis not present

## 2021-10-19 DIAGNOSIS — L570 Actinic keratosis: Secondary | ICD-10-CM | POA: Diagnosis not present

## 2021-10-19 DIAGNOSIS — Z872 Personal history of diseases of the skin and subcutaneous tissue: Secondary | ICD-10-CM | POA: Diagnosis not present

## 2021-10-19 DIAGNOSIS — L821 Other seborrheic keratosis: Secondary | ICD-10-CM | POA: Diagnosis not present

## 2021-11-04 DIAGNOSIS — L570 Actinic keratosis: Secondary | ICD-10-CM | POA: Diagnosis not present

## 2021-11-04 DIAGNOSIS — B351 Tinea unguium: Secondary | ICD-10-CM | POA: Diagnosis not present

## 2021-11-04 DIAGNOSIS — D044 Carcinoma in situ of skin of scalp and neck: Secondary | ICD-10-CM | POA: Diagnosis not present

## 2021-12-03 DIAGNOSIS — E782 Mixed hyperlipidemia: Secondary | ICD-10-CM | POA: Diagnosis not present

## 2021-12-08 ENCOUNTER — Encounter (INDEPENDENT_AMBULATORY_CARE_PROVIDER_SITE_OTHER): Payer: Self-pay | Admitting: *Deleted

## 2021-12-08 DIAGNOSIS — D509 Iron deficiency anemia, unspecified: Secondary | ICD-10-CM | POA: Diagnosis not present

## 2021-12-08 DIAGNOSIS — E782 Mixed hyperlipidemia: Secondary | ICD-10-CM | POA: Diagnosis not present

## 2021-12-08 DIAGNOSIS — E669 Obesity, unspecified: Secondary | ICD-10-CM | POA: Diagnosis not present

## 2021-12-08 DIAGNOSIS — Z85828 Personal history of other malignant neoplasm of skin: Secondary | ICD-10-CM | POA: Diagnosis not present

## 2021-12-08 DIAGNOSIS — M1A9XX1 Chronic gout, unspecified, with tophus (tophi): Secondary | ICD-10-CM | POA: Diagnosis not present

## 2021-12-08 DIAGNOSIS — J309 Allergic rhinitis, unspecified: Secondary | ICD-10-CM | POA: Diagnosis not present

## 2021-12-08 DIAGNOSIS — B001 Herpesviral vesicular dermatitis: Secondary | ICD-10-CM | POA: Diagnosis not present

## 2021-12-08 DIAGNOSIS — E875 Hyperkalemia: Secondary | ICD-10-CM | POA: Diagnosis not present

## 2021-12-08 DIAGNOSIS — R944 Abnormal results of kidney function studies: Secondary | ICD-10-CM | POA: Diagnosis not present

## 2021-12-08 DIAGNOSIS — Z125 Encounter for screening for malignant neoplasm of prostate: Secondary | ICD-10-CM | POA: Diagnosis not present

## 2021-12-08 DIAGNOSIS — I10 Essential (primary) hypertension: Secondary | ICD-10-CM | POA: Diagnosis not present

## 2021-12-08 DIAGNOSIS — J4520 Mild intermittent asthma, uncomplicated: Secondary | ICD-10-CM | POA: Diagnosis not present

## 2022-01-01 ENCOUNTER — Encounter (INDEPENDENT_AMBULATORY_CARE_PROVIDER_SITE_OTHER): Payer: Self-pay | Admitting: *Deleted

## 2022-01-11 DIAGNOSIS — E782 Mixed hyperlipidemia: Secondary | ICD-10-CM | POA: Diagnosis not present

## 2022-01-11 DIAGNOSIS — I10 Essential (primary) hypertension: Secondary | ICD-10-CM | POA: Diagnosis not present

## 2022-02-18 ENCOUNTER — Ambulatory Visit (HOSPITAL_COMMUNITY)
Admission: RE | Admit: 2022-02-18 | Discharge: 2022-02-18 | Disposition: A | Discharge status: Home / Self Care | Payer: Medicare HMO | Source: Ambulatory Visit | Attending: Family Medicine | Admitting: Family Medicine

## 2022-02-18 ENCOUNTER — Other Ambulatory Visit (HOSPITAL_COMMUNITY): Payer: Self-pay | Admitting: Family Medicine

## 2022-02-18 DIAGNOSIS — S42212A Unspecified displaced fracture of surgical neck of left humerus, initial encounter for closed fracture: Secondary | ICD-10-CM | POA: Diagnosis not present

## 2022-02-18 DIAGNOSIS — M25512 Pain in left shoulder: Secondary | ICD-10-CM | POA: Insufficient documentation

## 2022-02-18 DIAGNOSIS — M25511 Pain in right shoulder: Secondary | ICD-10-CM

## 2022-02-19 DIAGNOSIS — S42292A Other displaced fracture of upper end of left humerus, initial encounter for closed fracture: Secondary | ICD-10-CM | POA: Diagnosis not present

## 2022-02-26 ENCOUNTER — Ambulatory Visit (HOSPITAL_BASED_OUTPATIENT_CLINIC_OR_DEPARTMENT_OTHER): Payer: Medicare HMO

## 2022-02-26 ENCOUNTER — Ambulatory Visit (HOSPITAL_COMMUNITY)
Admission: RE | Admit: 2022-02-26 | Discharge: 2022-02-26 | Disposition: A | Discharge status: Home / Self Care | Payer: Medicare HMO | Source: Ambulatory Visit | Attending: Orthopedic Surgery | Admitting: Orthopedic Surgery

## 2022-02-26 ENCOUNTER — Other Ambulatory Visit (HOSPITAL_COMMUNITY): Payer: Self-pay | Admitting: Orthopedic Surgery

## 2022-02-26 DIAGNOSIS — S42295A Other nondisplaced fracture of upper end of left humerus, initial encounter for closed fracture: Secondary | ICD-10-CM

## 2022-02-26 DIAGNOSIS — S42212A Unspecified displaced fracture of surgical neck of left humerus, initial encounter for closed fracture: Secondary | ICD-10-CM | POA: Diagnosis not present

## 2022-02-26 DIAGNOSIS — S42292D Other displaced fracture of upper end of left humerus, subsequent encounter for fracture with routine healing: Secondary | ICD-10-CM | POA: Diagnosis not present

## 2022-02-26 DIAGNOSIS — M7989 Other specified soft tissue disorders: Secondary | ICD-10-CM | POA: Diagnosis not present

## 2022-03-01 ENCOUNTER — Encounter (HOSPITAL_BASED_OUTPATIENT_CLINIC_OR_DEPARTMENT_OTHER): Payer: Self-pay | Admitting: Orthopedic Surgery

## 2022-03-01 NOTE — H&P (Signed)
PREOPERATIVE H&P  Chief Complaint: left should pain  HPI: Geoffrey Patterson is a 71 y.o. male coming in for a follow-up on his left proximal humerus fracture. He was walking for exercise on 02-17-22 and he fell and injured his shoulder. He went to the ER and X-rays were taken at that time. The X-rays showed minimal displacement. He has had no new injuries. He is otherwise fairly healthy. He is a non-smoker. New x-rays were taken 02/26/22 showing increased displacement of the proximal humerus. Patient was sent for CT scan for better 3D imaging of the fracture.   Past Medical History:  Diagnosis Date   Gout    Hypertension    Past Surgical History:  Procedure Laterality Date   COLONOSCOPY     COLONOSCOPY N/A 12/30/2016   Procedure: COLONOSCOPY;  Surgeon: Rogene Houston, MD;  Location: [facility];  Service: Endoscopy;  Laterality: N/A;  1030   POLYPECTOMY  12/30/2016   Procedure: POLYPECTOMY;  Surgeon: Rogene Houston, MD;  Location: [facility];  Service: Endoscopy;;  colon    Social History   Socioeconomic History   Marital status: Legally Separated    Spouse name: Not on file   Number of children: Not on file   Years of education: Not on file   Highest education level: Not on file  Occupational History   Not on file  Tobacco Use   Smoking status: Never   Smokeless tobacco: Never  Vaping Use   Vaping Use: Never used  Substance and Sexual Activity   Alcohol use: No   Drug use: No   Sexual activity: Never  Other Topics Concern   Not on file  Social History Narrative   Not on file   Social Determinants of Health   Financial Resource Strain: Not on file  Food Insecurity: Not on file  Transportation Needs: Not on file  Physical Activity: Not on file  Stress: Not on file  Social Connections: Not on file   Family History  Problem Relation Age of Onset   Hypertension Mother    Hypertension Father    Diabetic kidney disease Father    No Known Allergies Prior to  Admission medications   Medication Sig Start Date End Date Taking? Authorizing Provider  albuterol (PROAIR HFA) 108 (90 Base) MCG/ACT inhaler Inhale 4 puffs into the lungs every 6 (six) hours as needed for wheezing or shortness of breath. 01/25/17   Valentina Shaggy, MD  allopurinol (ZYLOPRIM) 100 MG tablet Take 100 mg by mouth daily.    [provider]  apixaban (ELIQUIS) 5 MG TABS tablet Take 5 mg by mouth 2 (two) times daily.    [provider]  cetirizine (ZYRTEC) 10 MG tablet Take 10 mg by mouth at bedtime.    [provider]  colchicine 0.6 MG tablet Take 0.6 mg by mouth daily. 12/22/16   [provider]  diltiazem (CARDIZEM CD) 120 MG 24 hr capsule Take 120 mg by mouth daily.    [provider]  fluticasone furoate-vilanterol (BREO ELLIPTA) 100-25 MCG/INH AEPB Inhale 1 puff into the lungs daily. 01/25/17   Valentina Shaggy, MD  indomethacin (INDOCIN) 25 MG capsule Take 25 mg by mouth daily.    [provider]  olmesartan (BENICAR) 40 MG tablet Take 40 mg by mouth daily.    [provider]  valACYclovir (VALTREX) 500 MG tablet Take 500 mg by mouth 2 (two) times daily.    [provider]  Positive ROS: All other systems have been reviewed and were otherwise negative with the exception of those mentioned in the HPI and as above.  Physical Exam: General: Alert, no acute distress Cardiovascular: No pedal edema Respiratory: No cyanosis, no use of accessory musculature GI: No organomegaly, abdomen is soft and non-tender Skin: No lesions in the area of chief complaint Neurologic: Sensation intact distally Psychiatric: Patient is competent for consent with normal mood and affect Lymphatic: No axillary or cervical lymphadenopathy  MUSCULOSKELETAL: On examination he has significant ecchymosis at the distal humerus and some swelling in his hand. I am able to flex, extend and abduct all fingers of his left hand.  Distal sensation is intact. There is a 2+ radial pulse. Sensation intact to axillary nerve distribution.  Two views of the left shoulder taken today show an increased displacement, especially of the most lateral piece.  This is more displaced since last visit. CT scan shows comminuted proximal humerus fracture 1.5 cm displacement.  Assessment: Left displaced proximal humerus fracture   Plan: Plan for Procedure(s): REVERSE SHOULDER ARTHROPLASTY  The risks benefits and alternatives were discussed with the patient including but not limited to the risks of nonoperative treatment, versus surgical intervention including infection, bleeding, nerve injury,  blood clots, cardiopulmonary complications, morbidity, mortality, among others, and they were willing to proceed.    Ventura Bruns, PA-C    03/01/2022 1:40 PM

## 2022-03-04 ENCOUNTER — Encounter (HOSPITAL_COMMUNITY): Payer: Self-pay | Admitting: Orthopedic Surgery

## 2022-03-04 ENCOUNTER — Other Ambulatory Visit: Payer: Self-pay

## 2022-03-05 NOTE — Progress Notes (Signed)
Pt's sister, Erling Conte returned my call today and I gave her the pre-op instructions for Mr. Betley. He had requested that I give her the instructions also. She voiced understanding.

## 2022-03-06 NOTE — Anesthesia Preprocedure Evaluation (Signed)
Anesthesia Evaluation  Patient identified by MRN, date of birth, ID band Patient awake    Reviewed: Allergy & Precautions, NPO status , Patient's Chart, lab work & pertinent test results  Airway Mallampati: II  TM Distance: >3 FB Neck ROM: Full    Dental no notable dental hx.    Pulmonary neg pulmonary ROS, former smoker,    Pulmonary exam normal        Cardiovascular hypertension, + dysrhythmias Atrial Fibrillation  Rhythm:Regular Rate:Normal     Neuro/Psych negative neurological ROS  negative psych ROS   GI/Hepatic negative GI ROS, Neg liver ROS,   Endo/Other  negative endocrine ROS  Renal/GU negative Renal ROS  negative genitourinary   Musculoskeletal Left prox humerus fx   Abdominal Normal abdominal exam  (+)   Peds  Hematology negative hematology ROS (+)   Anesthesia Other Findings   Reproductive/Obstetrics                            Anesthesia Physical Anesthesia Plan  ASA: 3  Anesthesia Plan: General and Regional   Post-op Pain Management: Regional block*   Induction: Intravenous  PONV Risk Score and Plan: 2 and Ondansetron, Dexamethasone and Treatment may vary due to age or medical condition  Airway Management Planned: Mask and Oral ETT  Additional Equipment: None  Intra-op Plan:   Post-operative Plan: Extubation in OR  Informed Consent: I have reviewed the patients History and Physical, chart, labs and discussed the procedure including the risks, benefits and alternatives for the proposed anesthesia with the patient or authorized representative who has indicated his/her understanding and acceptance.     Dental advisory given  Plan Discussed with: CRNA  Anesthesia Plan Comments: (Lab Results      Component                Value               Date                      WBC                      8.6                 03/07/2022                HGB                       15.0                03/07/2022                HCT                      43.2                03/07/2022                MCV                      97.1                03/07/2022                PLT  265                 03/07/2022          )       Anesthesia Quick Evaluation

## 2022-03-07 ENCOUNTER — Ambulatory Visit (HOSPITAL_COMMUNITY): Payer: Medicare HMO

## 2022-03-07 ENCOUNTER — Other Ambulatory Visit: Payer: Self-pay

## 2022-03-07 ENCOUNTER — Ambulatory Visit (HOSPITAL_BASED_OUTPATIENT_CLINIC_OR_DEPARTMENT_OTHER): Payer: Medicare HMO | Admitting: Physician Assistant

## 2022-03-07 ENCOUNTER — Observation Stay (HOSPITAL_COMMUNITY)
Admission: RE | Admit: 2022-03-07 | Discharge: 2022-03-08 | Disposition: A | Discharge status: Home / Self Care | Payer: Medicare HMO | Source: Ambulatory Visit | Attending: Orthopedic Surgery | Admitting: Orthopedic Surgery

## 2022-03-07 ENCOUNTER — Encounter (HOSPITAL_COMMUNITY): Payer: Self-pay | Admitting: Orthopedic Surgery

## 2022-03-07 ENCOUNTER — Ambulatory Visit (HOSPITAL_COMMUNITY): Payer: Medicare HMO | Admitting: Physician Assistant

## 2022-03-07 ENCOUNTER — Encounter (HOSPITAL_COMMUNITY)
Admission: RE | Disposition: A | Discharge status: Home / Self Care | Payer: Self-pay | Source: Ambulatory Visit | Attending: Orthopedic Surgery

## 2022-03-07 DIAGNOSIS — Z79899 Other long term (current) drug therapy: Secondary | ICD-10-CM | POA: Insufficient documentation

## 2022-03-07 DIAGNOSIS — W19XXXA Unspecified fall, initial encounter: Secondary | ICD-10-CM | POA: Diagnosis not present

## 2022-03-07 DIAGNOSIS — I10 Essential (primary) hypertension: Secondary | ICD-10-CM | POA: Diagnosis not present

## 2022-03-07 DIAGNOSIS — I4891 Unspecified atrial fibrillation: Secondary | ICD-10-CM | POA: Insufficient documentation

## 2022-03-07 DIAGNOSIS — Z96642 Presence of left artificial hip joint: Secondary | ICD-10-CM | POA: Diagnosis not present

## 2022-03-07 DIAGNOSIS — Z96619 Presence of unspecified artificial shoulder joint: Secondary | ICD-10-CM

## 2022-03-07 DIAGNOSIS — Z7901 Long term (current) use of anticoagulants: Secondary | ICD-10-CM | POA: Insufficient documentation

## 2022-03-07 DIAGNOSIS — R2689 Other abnormalities of gait and mobility: Secondary | ICD-10-CM | POA: Diagnosis not present

## 2022-03-07 DIAGNOSIS — Z471 Aftercare following joint replacement surgery: Secondary | ICD-10-CM | POA: Diagnosis not present

## 2022-03-07 DIAGNOSIS — S42212A Unspecified displaced fracture of surgical neck of left humerus, initial encounter for closed fracture: Secondary | ICD-10-CM | POA: Diagnosis not present

## 2022-03-07 DIAGNOSIS — Z87891 Personal history of nicotine dependence: Secondary | ICD-10-CM | POA: Diagnosis not present

## 2022-03-07 DIAGNOSIS — Z96612 Presence of left artificial shoulder joint: Secondary | ICD-10-CM

## 2022-03-07 DIAGNOSIS — G8918 Other acute postprocedural pain: Secondary | ICD-10-CM | POA: Diagnosis not present

## 2022-03-07 DIAGNOSIS — M19012 Primary osteoarthritis, left shoulder: Secondary | ICD-10-CM | POA: Diagnosis not present

## 2022-03-07 DIAGNOSIS — Y9301 Activity, walking, marching and hiking: Secondary | ICD-10-CM | POA: Insufficient documentation

## 2022-03-07 DIAGNOSIS — S42202A Unspecified fracture of upper end of left humerus, initial encounter for closed fracture: Principal | ICD-10-CM | POA: Insufficient documentation

## 2022-03-07 DIAGNOSIS — Z01818 Encounter for other preprocedural examination: Secondary | ICD-10-CM

## 2022-03-07 DIAGNOSIS — M6281 Muscle weakness (generalized): Secondary | ICD-10-CM | POA: Insufficient documentation

## 2022-03-07 HISTORY — PX: REVERSE SHOULDER ARTHROPLASTY: SHX5054

## 2022-03-07 HISTORY — DX: Cardiac arrhythmia, unspecified: I49.9

## 2022-03-07 LAB — BASIC METABOLIC PANEL
Anion gap: 8 (ref 5–15)
BUN: 14 mg/dL (ref 8–23)
CO2: 24 mmol/L (ref 22–32)
Calcium: 9.3 mg/dL (ref 8.9–10.3)
Chloride: 109 mmol/L (ref 98–111)
Creatinine, Ser: 0.99 mg/dL (ref 0.61–1.24)
GFR, Estimated: >60 mL/min (ref >60)
Glucose, Bld: 102 mg/dL — ABNORMAL HIGH (ref 70–99)
Potassium: 3.8 mmol/L (ref 3.5–5.1)
Sodium: 141 mmol/L (ref 135–145)

## 2022-03-07 LAB — CBC
HCT: 43.2 % (ref 39.0–52.0)
Hemoglobin: 15 g/dL (ref 13.0–17.0)
MCH: 33.7 pg (ref 26.0–34.0)
MCHC: 34.7 g/dL (ref 30.0–36.0)
MCV: 97.1 fL (ref 80.0–100.0)
Platelets: 265 10*3/uL (ref 150–400)
RBC: 4.45 MIL/uL (ref 4.22–5.81)
RDW: 12.3 % (ref 11.5–15.5)
WBC: 8.6 10*3/uL (ref 4.0–10.5)
nRBC: 0 % (ref 0.0–0.2)

## 2022-03-07 LAB — SURGICAL PCR SCREEN
MRSA, PCR: NEGATIVE
Staphylococcus aureus: NEGATIVE

## 2022-03-07 SURGERY — ARTHROPLASTY, SHOULDER, TOTAL, REVERSE
Anesthesia: Regional | Site: Shoulder | Laterality: Left

## 2022-03-07 MED ORDER — TRANEXAMIC ACID-NACL 1000-0.7 MG/100ML-% IV SOLN
1000.0000 mg | INTRAVENOUS | Status: AC
  Administered 2022-03-07: 1000 mg via INTRAVENOUS

## 2022-03-07 MED ORDER — DOCUSATE SODIUM 100 MG PO CAPS
100.0000 mg | ORAL_CAPSULE | Freq: Two times a day (BID) | ORAL | Status: DC
  Administered 2022-03-07 – 2022-03-08 (×2): 100 mg via ORAL
  Filled 2022-03-07 (×2): qty 1

## 2022-03-07 MED ORDER — IRBESARTAN 300 MG PO TABS
300.0000 mg | ORAL_TABLET | Freq: Every day | ORAL | Status: DC
  Administered 2022-03-08: 300 mg via ORAL
  Filled 2022-03-07: qty 1

## 2022-03-07 MED ORDER — LACTATED RINGERS IV SOLN: INTRAVENOUS | Status: DC

## 2022-03-07 MED ORDER — ACETAMINOPHEN 325 MG PO TABS: 325.0000 mg | ORAL_TABLET | Freq: Four times a day (QID) | ORAL | Status: DC | PRN

## 2022-03-07 MED ORDER — CEFAZOLIN SODIUM-DEXTROSE 2-4 GM/100ML-% IV SOLN
INTRAVENOUS | Status: AC
  Filled 2022-03-07: qty 100

## 2022-03-07 MED ORDER — ALUM & MAG HYDROXIDE-SIMETH 200-200-20 MG/5ML PO SUSP: 30.0000 mL | ORAL | Status: DC | PRN

## 2022-03-07 MED ORDER — ONDANSETRON HCL 4 MG/2ML IJ SOLN
INTRAMUSCULAR | Status: AC
  Filled 2022-03-07: qty 2

## 2022-03-07 MED ORDER — PROPOFOL 10 MG/ML IV BOLUS
INTRAVENOUS | Status: DC | PRN
  Administered 2022-03-07: 200 mg via INTRAVENOUS

## 2022-03-07 MED ORDER — POTASSIUM CHLORIDE IN NACL 20-0.45 MEQ/L-% IV SOLN
INTRAVENOUS | Status: DC
  Filled 2022-03-07 (×3): qty 1000

## 2022-03-07 MED ORDER — PHENOL 1.4 % MT LIQD: 1.0000 | OROMUCOSAL | Status: DC | PRN

## 2022-03-07 MED ORDER — ALBUMIN HUMAN 5 % IV SOLN: INTRAVENOUS | Status: DC | PRN

## 2022-03-07 MED ORDER — TRANEXAMIC ACID-NACL 1000-0.7 MG/100ML-% IV SOLN
INTRAVENOUS | Status: AC
  Filled 2022-03-07: qty 100

## 2022-03-07 MED ORDER — ROCURONIUM BROMIDE 10 MG/ML (PF) SYRINGE
PREFILLED_SYRINGE | INTRAVENOUS | Status: AC
  Filled 2022-03-07: qty 10

## 2022-03-07 MED ORDER — ROCURONIUM BROMIDE 10 MG/ML (PF) SYRINGE
PREFILLED_SYRINGE | INTRAVENOUS | Status: DC | PRN
  Administered 2022-03-07: 60 mg via INTRAVENOUS
  Administered 2022-03-07 (×3): 20 mg via INTRAVENOUS

## 2022-03-07 MED ORDER — METHOCARBAMOL 1000 MG/10ML IJ SOLN: 500.0000 mg | Freq: Four times a day (QID) | INTRAVENOUS | Status: DC | PRN

## 2022-03-07 MED ORDER — ACETAMINOPHEN 10 MG/ML IV SOLN: 1000.0000 mg | Freq: Once | INTRAVENOUS | Status: DC | PRN

## 2022-03-07 MED ORDER — CHLORHEXIDINE GLUCONATE 0.12 % MT SOLN
OROMUCOSAL | Status: AC
  Filled 2022-03-07: qty 15

## 2022-03-07 MED ORDER — PHENYLEPHRINE 80 MCG/ML (10ML) SYRINGE FOR IV PUSH (FOR BLOOD PRESSURE SUPPORT)
PREFILLED_SYRINGE | INTRAVENOUS | Status: AC
  Filled 2022-03-07: qty 10

## 2022-03-07 MED ORDER — ACETAMINOPHEN 500 MG PO TABS
1000.0000 mg | ORAL_TABLET | Freq: Four times a day (QID) | ORAL | Status: AC
  Administered 2022-03-07 – 2022-03-08 (×4): 1000 mg via ORAL
  Filled 2022-03-07 (×4): qty 2

## 2022-03-07 MED ORDER — ALLOPURINOL 300 MG PO TABS
300.0000 mg | ORAL_TABLET | Freq: Every day | ORAL | Status: DC
  Administered 2022-03-08: 300 mg via ORAL
  Filled 2022-03-07: qty 1

## 2022-03-07 MED ORDER — CEFAZOLIN SODIUM-DEXTROSE 2-4 GM/100ML-% IV SOLN
2.0000 g | INTRAVENOUS | Status: AC
  Administered 2022-03-07: 2 g via INTRAVENOUS

## 2022-03-07 MED ORDER — DIPHENHYDRAMINE HCL 12.5 MG/5ML PO ELIX
12.5000 mg | ORAL_SOLUTION | ORAL | Status: DC | PRN
  Administered 2022-03-08: 25 mg via ORAL
  Filled 2022-03-07: qty 10

## 2022-03-07 MED ORDER — DEXAMETHASONE SODIUM PHOSPHATE 10 MG/ML IJ SOLN
INTRAMUSCULAR | Status: DC | PRN
  Administered 2022-03-07: 5 mg via INTRAVENOUS

## 2022-03-07 MED ORDER — ACETAMINOPHEN 500 MG PO TABS: 1000.0000 mg | ORAL_TABLET | Freq: Once | ORAL | Status: AC

## 2022-03-07 MED ORDER — ROSUVASTATIN CALCIUM 5 MG PO TABS
5.0000 mg | ORAL_TABLET | Freq: Every day | ORAL | Status: DC
  Administered 2022-03-08: 5 mg via ORAL
  Filled 2022-03-07: qty 1

## 2022-03-07 MED ORDER — ONDANSETRON HCL 4 MG PO TABS: 4.0000 mg | ORAL_TABLET | Freq: Four times a day (QID) | ORAL | Status: DC | PRN

## 2022-03-07 MED ORDER — TRANEXAMIC ACID-NACL 1000-0.7 MG/100ML-% IV SOLN
1000.0000 mg | Freq: Once | INTRAVENOUS | Status: AC
  Administered 2022-03-07: 1000 mg via INTRAVENOUS
  Filled 2022-03-07: qty 100

## 2022-03-07 MED ORDER — DEXAMETHASONE SODIUM PHOSPHATE 10 MG/ML IJ SOLN
INTRAMUSCULAR | Status: AC
  Filled 2022-03-07: qty 1

## 2022-03-07 MED ORDER — HYDROMORPHONE HCL 1 MG/ML IJ SOLN: 0.5000 mg | INTRAMUSCULAR | Status: DC | PRN

## 2022-03-07 MED ORDER — CEFAZOLIN SODIUM-DEXTROSE 2-4 GM/100ML-% IV SOLN
2.0000 g | Freq: Four times a day (QID) | INTRAVENOUS | Status: AC
  Administered 2022-03-07 – 2022-03-08 (×3): 2 g via INTRAVENOUS
  Filled 2022-03-07 (×3): qty 100

## 2022-03-07 MED ORDER — FENTANYL CITRATE (PF) 250 MCG/5ML IJ SOLN
INTRAMUSCULAR | Status: DC | PRN
  Administered 2022-03-07 (×3): 50 ug via INTRAVENOUS

## 2022-03-07 MED ORDER — METOCLOPRAMIDE HCL 5 MG PO TABS: 5.0000 mg | ORAL_TABLET | Freq: Three times a day (TID) | ORAL | Status: DC | PRN

## 2022-03-07 MED ORDER — PROPOFOL 10 MG/ML IV BOLUS
INTRAVENOUS | Status: AC
  Filled 2022-03-07: qty 20

## 2022-03-07 MED ORDER — DILTIAZEM HCL ER COATED BEADS 120 MG PO CP24
120.0000 mg | ORAL_CAPSULE | Freq: Every day | ORAL | Status: DC
  Administered 2022-03-08: 120 mg via ORAL
  Filled 2022-03-07: qty 1

## 2022-03-07 MED ORDER — ONDANSETRON HCL 4 MG/2ML IJ SOLN: 4.0000 mg | Freq: Four times a day (QID) | INTRAMUSCULAR | Status: DC | PRN

## 2022-03-07 MED ORDER — LIDOCAINE 2% (20 MG/ML) 5 ML SYRINGE
INTRAMUSCULAR | Status: DC | PRN
  Administered 2022-03-07: 40 mg via INTRAVENOUS

## 2022-03-07 MED ORDER — METOCLOPRAMIDE HCL 5 MG/ML IJ SOLN: 5.0000 mg | Freq: Three times a day (TID) | INTRAMUSCULAR | Status: DC | PRN

## 2022-03-07 MED ORDER — LIDOCAINE 2% (20 MG/ML) 5 ML SYRINGE
INTRAMUSCULAR | Status: AC
  Filled 2022-03-07: qty 5

## 2022-03-07 MED ORDER — PHENYLEPHRINE 80 MCG/ML (10ML) SYRINGE FOR IV PUSH (FOR BLOOD PRESSURE SUPPORT)
PREFILLED_SYRINGE | INTRAVENOUS | Status: DC | PRN
  Administered 2022-03-07 (×2): 160 ug via INTRAVENOUS

## 2022-03-07 MED ORDER — FENTANYL CITRATE (PF) 250 MCG/5ML IJ SOLN
INTRAMUSCULAR | Status: AC
  Filled 2022-03-07: qty 5

## 2022-03-07 MED ORDER — CHLORHEXIDINE GLUCONATE 0.12 % MT SOLN: 15.0000 mL | Freq: Once | OROMUCOSAL | Status: DC

## 2022-03-07 MED ORDER — ACETAMINOPHEN 500 MG PO TABS
ORAL_TABLET | ORAL | Status: AC
  Administered 2022-03-07: 1000 mg via ORAL
  Filled 2022-03-07: qty 2

## 2022-03-07 MED ORDER — PHENYLEPHRINE HCL-NACL 20-0.9 MG/250ML-% IV SOLN
INTRAVENOUS | Status: DC | PRN
  Administered 2022-03-07: 40 ug/min via INTRAVENOUS

## 2022-03-07 MED ORDER — 0.9 % SODIUM CHLORIDE (POUR BTL) OPTIME
TOPICAL | Status: DC | PRN
  Administered 2022-03-07: 1000 mL

## 2022-03-07 MED ORDER — OXYCODONE HCL 5 MG PO TABS
10.0000 mg | ORAL_TABLET | ORAL | Status: DC | PRN
  Administered 2022-03-07: 10 mg via ORAL
  Filled 2022-03-07: qty 2

## 2022-03-07 MED ORDER — BISACODYL 10 MG RE SUPP: 10.0000 mg | Freq: Every day | RECTAL | Status: DC | PRN

## 2022-03-07 MED ORDER — OXYCODONE HCL 5 MG PO TABS: 5.0000 mg | ORAL_TABLET | ORAL | Status: DC | PRN

## 2022-03-07 MED ORDER — METHOCARBAMOL 500 MG PO TABS
500.0000 mg | ORAL_TABLET | Freq: Four times a day (QID) | ORAL | Status: DC | PRN
  Administered 2022-03-07: 500 mg via ORAL
  Filled 2022-03-07: qty 1

## 2022-03-07 MED ORDER — MENTHOL 3 MG MT LOZG: 1.0000 | LOZENGE | OROMUCOSAL | Status: DC | PRN

## 2022-03-07 MED ORDER — POLYETHYLENE GLYCOL 3350 17 G PO PACK: 17.0000 g | PACK | Freq: Every day | ORAL | Status: DC | PRN

## 2022-03-07 MED ORDER — SUGAMMADEX SODIUM 200 MG/2ML IV SOLN
INTRAVENOUS | Status: DC | PRN
  Administered 2022-03-07: 200 mg via INTRAVENOUS

## 2022-03-07 MED ORDER — MAGNESIUM CITRATE PO SOLN: 1.0000 | Freq: Once | ORAL | Status: DC | PRN

## 2022-03-07 MED ORDER — FENTANYL CITRATE (PF) 100 MCG/2ML IJ SOLN: 25.0000 ug | INTRAMUSCULAR | Status: DC | PRN

## 2022-03-07 MED ORDER — ONDANSETRON HCL 4 MG/2ML IJ SOLN
INTRAMUSCULAR | Status: DC | PRN
  Administered 2022-03-07: 4 mg via INTRAVENOUS

## 2022-03-07 SURGICAL SUPPLY — 57 items
AID PSTN UNV HD RSTRNT DISP (MISCELLANEOUS) ×1
BAG COUNTER SPONGE SURGICOUNT (BAG) ×1 IMPLANT
BAG SPNG CNTER NS LX DISP (BAG) ×1
BASEPLATE GLENOSPHERE 25 (Plate) IMPLANT
BEARING HUMERAL SHLDER 36M STD (Shoulder) IMPLANT
BIT DRILL TWIST 2.7 (BIT) IMPLANT
BLADE SAW SGTL 73X25 THK (BLADE) ×1 IMPLANT
BRNG HUM STD 36 RVRS SHLDR (Shoulder) ×1 IMPLANT
CLSR STERI-STRIP ANTIMIC 1/2X4 (GAUZE/BANDAGES/DRESSINGS) IMPLANT
COVER SURGICAL LIGHT HANDLE (MISCELLANEOUS) ×1 IMPLANT
DRAPE ORTHO SPLIT 77X108 STRL (DRAPES) ×2
DRAPE SURG ORHT 6 SPLT 77X108 (DRAPES) ×2 IMPLANT
DRAPE U-SHAPE 47X51 STRL (DRAPES) ×1 IMPLANT
DRSG MEPILEX POST OP 4X8 (GAUZE/BANDAGES/DRESSINGS) IMPLANT
DURAPREP 26ML APPLICATOR (WOUND CARE) ×1 IMPLANT
ELECT REM PT RETURN 9FT ADLT (ELECTROSURGICAL) ×1
ELECTRODE REM PT RTRN 9FT ADLT (ELECTROSURGICAL) ×1 IMPLANT
GLENOID SPHERE STD STRL 36MM (Orthopedic Implant) IMPLANT
GLOVE BIO SURGEON STRL SZ7 (GLOVE) ×1 IMPLANT
GLOVE BIOGEL PI IND STRL 7.0 (GLOVE) ×1 IMPLANT
GLOVE BIOGEL PI IND STRL 8 (GLOVE) ×1 IMPLANT
GLOVE ORTHO TXT STRL SZ7.5 (GLOVE) ×1 IMPLANT
GOWN STRL REUS W/ TWL LRG LVL3 (GOWN DISPOSABLE) ×1 IMPLANT
GOWN STRL REUS W/TWL LRG LVL3 (GOWN DISPOSABLE) ×3
HOOD PEEL AWAY FACE SHEILD DIS (HOOD) ×1 IMPLANT
HOOD PEEL AWAY FLYTE STAYCOOL (MISCELLANEOUS) ×1 IMPLANT
KIT BASIN OR (CUSTOM PROCEDURE TRAY) ×1 IMPLANT
KIT TURNOVER KIT B (KITS) ×1 IMPLANT
MANIFOLD NEPTUNE II (INSTRUMENTS) ×1 IMPLANT
NS IRRIG 1000ML POUR BTL (IV SOLUTION) ×1 IMPLANT
PACK SHOULDER (CUSTOM PROCEDURE TRAY) ×1 IMPLANT
PAD ARMBOARD 7.5X6 YLW CONV (MISCELLANEOUS) ×2 IMPLANT
PIN THREADED REVERSE (PIN) IMPLANT
RESTRAINT HEAD UNIVERSAL NS (MISCELLANEOUS) IMPLANT
SCREW BONE LOCKING 4.75X30X3.5 (Screw) IMPLANT
SCREW BONE STRL 6.5MMX30MM (Screw) IMPLANT
SCREW LOCKING 4.75MMX15MM (Screw) IMPLANT
SHOULDER HUMERAL BEAR 36M STD (Shoulder) ×1 IMPLANT
SLING ARM FOAM STRAP XLG (SOFTGOODS) IMPLANT
SLING ARM IMMOBILIZER LRG (SOFTGOODS) ×1 IMPLANT
SLING ARM IMMOBILIZER MED (SOFTGOODS) IMPLANT
SPONGE T-LAP 18X18 ~~LOC~~+RFID (SPONGE) ×1 IMPLANT
STEM HUMERAL STRL 16MMX83MM (Stem) IMPLANT
SUCTION FRAZIER HANDLE 10FR (MISCELLANEOUS) ×1
SUCTION TUBE FRAZIER 10FR DISP (MISCELLANEOUS) ×1 IMPLANT
SUPPORT WRAP ARM LG (MISCELLANEOUS) ×1 IMPLANT
SUT MAXBRAID #2 CVD NDL (SUTURE) IMPLANT
SUT MAXBRAID #5 CCS-NDL 2PK (SUTURE) IMPLANT
SUT VIC AB 0 CT1 27 (SUTURE) ×1
SUT VIC AB 0 CT1 27XBRD ANBCTR (SUTURE) IMPLANT
SUT VIC AB 2-0 CT1 27 (SUTURE) ×1
SUT VIC AB 2-0 CT1 TAPERPNT 27 (SUTURE) ×1 IMPLANT
SUT VIC AB 3-0 SH 27 (SUTURE) ×1
SUT VIC AB 3-0 SH 27X BRD (SUTURE) IMPLANT
TOWEL GREEN STERILE (TOWEL DISPOSABLE) ×1 IMPLANT
TRAY HUM REV SHOULDER +3 +5 (Shoulder) IMPLANT
WATER STERILE IRR 1000ML POUR (IV SOLUTION) ×1 IMPLANT

## 2022-03-07 NOTE — Op Note (Signed)
03/07/2022  10:55 AM  PATIENT:  Geoffrey Patterson    PRE-OPERATIVE DIAGNOSIS: Left proximal humerus fracture, head split  POST-OPERATIVE DIAGNOSIS:  Same  PROCEDURE: LEFT reverse Total Shoulder Arthroplasty  SURGEON:  Johnny Bridge, MD  PHYSICIAN ASSISTANT: Merlene Pulling, PA-C, present and scrubbed throughout the case, critical for completion in a timely fashion, and for retraction, instrumentation, and closure.  ANESTHESIA:   General with interscalene block using Exparel  ESTIMATED BLOOD LOSS: 150 mL  UNIQUE ASPECTS OF THE CASE: Bone quality was not particularly good.  I had a excellent tuberosity repair bringing the greater tuberosity back around to the lesser tuberosity, with appropriate soft tissue tension, delivering both segments back down to the bone.  I had excellent press-fit stability with a 16.  Implant Name Type Inv. Item Serial No. Manufacturer Lot No. LRB No. Used Action  BASEPLATE GLENOSPHERE 25 - [id number] Plate BASEPLATE GLENOSPHERE 25  [employer](ORTH,TRAU,[employer],SG) [id number] Left 1 Implanted  SCREW BONE STRL 6.5MMX30MM - [id number] Screw SCREW BONE STRL 6.5MMX30MM  [employer](ORTH,TRAU,BIO,SG) [id number] Left 1 Implanted  SCREW BONE LOCKING 1.93X90W4.5 - [id number] Screw SCREW BONE LOCKING 4.75X30X3.5  [employer](ORTH,TRAU,BIO,SG) [id number] Left 1 Implanted  SCREW BONE LOCKING 6.22W97L8.5 - [id number] Screw SCREW BONE LOCKING 4.75X30X3.5  [employer](ORTH,TRAU,BIO,SG) [id number] Left 1 Implanted  SCREW LOCKING 4.75MMX15MM - [id number] Screw SCREW LOCKING 4.75MMX15MM  [employer](ORTH,TRAU,BIO,SG) [id number] Left 1 Implanted  SCREW LOCKING 4.75MMX15MM - [id number] Screw SCREW LOCKING 4.75MMX15MM  [employer](ORTH,TRAU,BIO,SG) [id number] Left 1 Implanted  GLENOID SPHERE STD STRL 74MM - [id number] Orthopedic Implant GLENOID SPHERE STD STRL 74MM  [employer](ORTH,TRAU,BIO,SG) [id number] Left 1 Implanted  STEM HUMERAL STRL 16MMX83MM - [id number] Stem STEM HUMERAL STRL  16MMX83MM  [employer](ORTH,TRAU,BIO,SG) [id number] Left 1 Implanted  SHOULDER HUMERAL BEAR 74M STD - [id number] Shoulder SHOULDER HUMERAL BEAR 74M STD  [employer](ORTH,TRAU,BIO,SG) [id number] Left 1 Implanted  TRAY HUM REV SHOULDER +3 +5 - [id number] Shoulder TRAY HUM REV SHOULDER +3 +5  [employer](ORTH,TRAU,BIO,SG) [id number] Left 1 Implanted     PREOPERATIVE INDICATIONS:  Geoffrey Patterson is a  71 y.o. male with a diagnosis of left proximal humerus fracture who failed conservative measures and elected for surgical management.    The risks benefits and alternatives were discussed with the patient preoperatively including but not limited to the risks of infection, bleeding, nerve injury, cardiopulmonary complications, the need for revision surgery, dislocation, brachial plexus palsy, incomplete relief of pain, among others, and the patient was willing to proceed.  OPERATIVE FINDINGS: Displaced four-part proximal humerus fracture.  OPERATIVE PROCEDURE: The patient was brought to the operating room and placed in the supine position. General anesthesia was administered. IV antibiotics were given.  Prescrub was performed.  Time out was performed. The upper extremity was prepped and draped in usual sterile fashion. The patient was in a beachchair position.   Deltopectoral approach was carried out. The biceps was tenodesed to the pectoralis tendon with #2 Fiberwire.  I used an osteotome to perform a lesser tuberosity osteotomy, and did the same thing for the greater tuberosity, and then remove the head segment and any loose fragments of bone.  Deep retractors were placed, and I placed a guidepin into the center position on the glenoid, with slight inferior inclination. I then reamed over the guidepin, and this created a small metaphyseal cancellus blush inferiorly, removing just the cartilage to the subchondral bone superiorly. The base plate was selected and impacted place, and then I secured it centrally  with a nonlocking screw, and I  had excellent purchase both inferiorly and superiorly. I placed a short locking screws on anterior and posterior aspects.  I then turned my attention to the glenosphere, and impacted this into place, placing slight inferior offset (set on B).   The glenosphere was completely seated, and had engagement of the [facility] taper. I then turned my attention back to the humerus.  I sequentially broached, and then trialed, and was found to restore soft tissue tension, and it had 2 finger tightness. Therefore the above named components were selected. The shoulder felt stable throughout functional motion.  Before I placed the real prosthesis I had also placed a total of 2 #2 FiberWire through drill holes in the humerus for later around the world repair.  I then impacted the real prosthesis into place, as well as the real humeral tray, and reduced the shoulder. The shoulder had excellent motion, and was stable, and I irrigated the wounds copiously.   I then repaired the subscapularis to the greater tuberosity with a total of 4 #5 max braid, and then used the remaining 2 in the shaft to deliver the tuberosity segment down to the bone.  I also placed bone graft from the head to help augment tuberosity healing.  I then irrigated the shoulder copiously once more, repaired the deltopectoral interval with Vicryl followed by subcutaneous Vicryl with Steri-Strips and sterile gauze for the skin. The patient was awakened and returned back in stable and satisfactory condition. There were no complications and He tolerated the procedure well.

## 2022-03-07 NOTE — Anesthesia Postprocedure Evaluation (Signed)
Anesthesia Post Note  Patient: Geoffrey Patterson  Procedure(s) Performed: REVERSE SHOULDER ARTHROPLASTY (Left: Shoulder)     Patient location during evaluation: PACU Anesthesia Type: Regional and General Level of consciousness: awake Pain management: pain level controlled Vital Signs Assessment: post-procedure vital signs reviewed and stable Respiratory status: spontaneous breathing, nonlabored ventilation, respiratory function stable and patient connected to nasal cannula oxygen Cardiovascular status: blood pressure returned to baseline and stable Postop Assessment: no apparent nausea or vomiting Anesthetic complications: no   No notable events documented.  Last Vitals:  Vitals:   03/07/22 1230 03/07/22 1250  BP: (!) 142/86 (!) 154/88  Pulse: 71 76  Resp: 19 18  Temp:    SpO2: 98% 98%    Last Pain:  Vitals:   03/07/22 1250  TempSrc:   PainSc: 2                  Symir Mah P Aijalon Kirtz

## 2022-03-07 NOTE — Progress Notes (Signed)
Orthopedic Tech Progress Note Patient Details:  Geoffrey Patterson 12/10/1950 [id number]  Patient ID: [name], male   DOB: August 04, 1950, 71 y.o.   MRN: [id number] No OHF; Upper extremity injury. Vernona Rieger 03/07/2022, 1:43 PM

## 2022-03-07 NOTE — Interval H&P Note (Signed)
History and Physical Interval Note:  03/07/2022 7:53 AM  Geoffrey Patterson  has presented today for surgery, with the diagnosis of left proximal humerus fracture.  The various methods of treatment have been discussed with the patient and family. After consideration of risks, benefits and other options for treatment, the patient has consented to  Procedure(s): REVERSE SHOULDER ARTHROPLASTY (Left) as a surgical intervention.  The patient's history has been reviewed, patient examined, no change in status, stable for surgery.  I have reviewed the patient's chart and labs.  Questions were answered to the patient's satisfaction.     Johnny Bridge

## 2022-03-07 NOTE — Progress Notes (Signed)
Unable to obtain EKG. Spoke with Dr. Gloris Manchester. Discontinued EKG orders

## 2022-03-07 NOTE — Anesthesia Procedure Notes (Signed)
Anesthesia Regional Block: Interscalene brachial plexus block   Pre-Anesthetic Checklist: , timeout performed,  Correct Patient, Correct Site, Correct Laterality,  Correct Procedure, Correct Position, site marked,  Risks and benefits discussed,  Surgical consent,  Pre-op evaluation,  At surgeon's request and post-op pain management  Laterality: Left  Prep: Dura Prep       Needles:  Injection technique: Single-shot  Needle Type: Echogenic Stimulator Needle     Needle Length: 5cm  Needle Gauge: 20     Additional Needles:   Procedures:,,,, ultrasound used (permanent image in chart),,    Narrative:  Start time: 03/07/2022 7:20 AM End time: 03/07/2022 7:22 AM Injection made incrementally with aspirations every 5 mL.  Performed by: Personally  Anesthesiologist: Darral Dash, DO  Additional Notes: Patient identified. Risks/Benefits/Options discussed with patient including but not limited to bleeding, infection, nerve damage, failed block, incomplete pain control. Patient expressed understanding and wished to proceed. All questions were answered. Sterile technique was used throughout the entire procedure. Please see nursing notes for vital signs. Aspirated in 5cc intervals with injection for negative confirmation. Patient was given instructions on fall risk and not to get out of bed. All questions and concerns addressed with instructions to call with any issues or inadequate analgesia.

## 2022-03-07 NOTE — Transfer of Care (Signed)
Immediate Anesthesia Transfer of Care Note  Patient: Geoffrey Patterson  Procedure(s) Performed: REVERSE SHOULDER ARTHROPLASTY (Left: Shoulder)  Patient Location: PACU  Anesthesia Type:GA combined with regional for post-op pain  Level of Consciousness: awake and alert   Airway & Oxygen Therapy: Patient Spontanous Breathing and Patient connected to face mask oxygen  Post-op Assessment: Report given to RN and Post -op Vital signs reviewed and stable  Post vital signs: Reviewed and stable  Last Vitals:  Vitals Value Taken Time  BP 147/87 03/07/22 1130  Temp    Pulse 70 03/07/22 1133  Resp 20 03/07/22 1133  SpO2 97 % 03/07/22 1133  Vitals shown include unvalidated device data.  Last Pain:  Vitals:   03/07/22 0648  TempSrc: Oral      Patients Stated Pain Goal: 3 (07/35/43 0148)  Complications: No notable events documented.

## 2022-03-07 NOTE — Anesthesia Procedure Notes (Signed)
Procedure Name: Intubation Date/Time: 03/07/2022 8:41 AM  Performed by: Reece Agar, CRNAPre-anesthesia Checklist: Patient identified, Emergency Drugs available, Suction available and Patient being monitored Patient Re-evaluated:Patient Re-evaluated prior to induction Oxygen Delivery Method: Circle System Utilized Preoxygenation: Pre-oxygenation with 100% oxygen Induction Type: IV induction Ventilation: Mask ventilation without difficulty Laryngoscope Size: Mac and 4 Grade View: Grade I Tube type: Oral Tube size: 7.5 mm Number of attempts: 1 Airway Equipment and Method: Stylet Placement Confirmation: ETT inserted through vocal cords under direct vision, positive ETCO2 and breath sounds checked- equal and bilateral Secured at: 23 cm Tube secured with: Tape Dental Injury: Teeth and Oropharynx as per pre-operative assessment

## 2022-03-08 ENCOUNTER — Encounter (HOSPITAL_COMMUNITY): Payer: Self-pay | Admitting: Orthopedic Surgery

## 2022-03-08 DIAGNOSIS — Z79899 Other long term (current) drug therapy: Secondary | ICD-10-CM | POA: Diagnosis not present

## 2022-03-08 DIAGNOSIS — I10 Essential (primary) hypertension: Secondary | ICD-10-CM | POA: Diagnosis not present

## 2022-03-08 DIAGNOSIS — S42202A Unspecified fracture of upper end of left humerus, initial encounter for closed fracture: Secondary | ICD-10-CM | POA: Diagnosis not present

## 2022-03-08 DIAGNOSIS — Z7901 Long term (current) use of anticoagulants: Secondary | ICD-10-CM | POA: Diagnosis not present

## 2022-03-08 DIAGNOSIS — R2689 Other abnormalities of gait and mobility: Secondary | ICD-10-CM | POA: Diagnosis not present

## 2022-03-08 DIAGNOSIS — I4891 Unspecified atrial fibrillation: Secondary | ICD-10-CM | POA: Diagnosis not present

## 2022-03-08 DIAGNOSIS — W19XXXA Unspecified fall, initial encounter: Secondary | ICD-10-CM | POA: Diagnosis not present

## 2022-03-08 DIAGNOSIS — M6281 Muscle weakness (generalized): Secondary | ICD-10-CM | POA: Diagnosis not present

## 2022-03-08 DIAGNOSIS — Y9301 Activity, walking, marching and hiking: Secondary | ICD-10-CM | POA: Diagnosis not present

## 2022-03-08 MED ORDER — ONDANSETRON HCL 4 MG PO TABS: 4.0000 mg | ORAL_TABLET | Freq: Three times a day (TID) | ORAL | 0 refills | Status: DC | PRN

## 2022-03-08 MED ORDER — SENNA-DOCUSATE SODIUM 8.6-50 MG PO TABS: 2.0000 | ORAL_TABLET | Freq: Every day | ORAL | 1 refills | Status: DC

## 2022-03-08 MED ORDER — TRAMADOL HCL 50 MG PO TABS: 50.0000 mg | ORAL_TABLET | ORAL | 0 refills | Status: DC

## 2022-03-08 NOTE — Discharge Summary (Signed)
Discharge Summary  Patient ID: Geoffrey Patterson MRN: [id number] DOB/AGE: Jun 11, 1951 71 y.o.  Admit date: 03/07/2022 Discharge date: 03/08/2022  Admission Diagnoses:  Displaced left proximal humerus fracture  Discharge Diagnoses:  Principal Problem:   S/p reverse total shoulder arthroplasty Active Problems:   S/P reverse total shoulder arthroplasty, left   Past Medical History:  Diagnosis Date   Dysrhythmia    a-fib history   Gout    Hypertension     Surgeries: Procedure(s): REVERSE SHOULDER ARTHROPLASTY on 03/07/2022   Consultants (if any):   Discharged Condition: Improved  Hospital Course: Geoffrey Patterson is an 71 y.o. male who was admitted 03/07/2022 with a diagnosis of left proximal humerus fracture and went to the operating room on 03/07/2022 and underwent the above named procedures.    He was given perioperative antibiotics:  Anti-infectives (From admission, onward)    Start     Dose/Rate Route Frequency Ordered Stop   03/07/22 1500  ceFAZolin (ANCEF) IVPB 2g/100 mL premix        2 g 200 mL/hr over 30 Minutes Intravenous Every 6 hours 03/07/22 1253 03/08/22 0353   03/07/22 0630  ceFAZolin (ANCEF) IVPB 2g/100 mL premix        2 g 200 mL/hr over 30 Minutes Intravenous On call to O.R. 03/07/22 9833 03/07/22 0902   03/07/22 0628  ceFAZolin (ANCEF) 2-4 GM/100ML-% IVPB       Note to Pharmacy: [facility] : cabinet override      03/07/22 8250 03/07/22 0916     .  He was given sequential compression devices, early ambulation for DVT prophylaxis.  He benefited maximally from the hospital stay and there were no complications.    Recent vital signs:  Vitals:   03/08/22 0426 03/08/22 0908  BP: (!) 156/89 (!) 141/95  Pulse: 79 76  Resp: 16 18  Temp: 98.1 F (36.7 C) 98.1 F (36.7 C)  SpO2: 97% 98%    Recent laboratory studies:  Lab Results  Component Value Date   HGB 15.0 03/07/2022   Lab Results  Component Value Date   WBC 8.6 03/07/2022   PLT 265  03/07/2022   No results found for: "INR" Lab Results  Component Value Date   NA 141 03/07/2022   K 3.8 03/07/2022   CL 109 03/07/2022   CO2 24 03/07/2022   BUN 14 03/07/2022   CREATININE 0.99 03/07/2022   GLUCOSE 102 (H) 03/07/2022    Discharge Medications:   Allergies as of 03/08/2022   No Known Allergies      Medication List     TAKE these medications    allopurinol 300 MG tablet Commonly known as: ZYLOPRIM Take 300 mg by mouth daily.   Cardizem CD 120 MG 24 hr capsule Generic drug: diltiazem Take 120 mg by mouth daily.   fexofenadine 180 MG tablet Commonly known as: ALLEGRA Take 180 mg by mouth daily.   ondansetron 4 MG tablet Commonly known as: Zofran Take 1 tablet (4 mg total) by mouth every 8 (eight) hours as needed for nausea or vomiting.   rosuvastatin 5 MG tablet Commonly known as: CRESTOR Take 5 mg by mouth daily.   sennosides-docusate sodium 8.6-50 MG tablet Commonly known as: SENOKOT-S Take 2 tablets by mouth daily.   telmisartan 80 MG tablet Commonly known as: MICARDIS Take 80 mg by mouth daily.   traMADol 50 MG tablet Commonly known as: Ultram Take 1 tablet (50 mg total) by mouth every 4 (four) hours. What changed:  when to take this reasons to take this   valACYclovir 1000 MG tablet Commonly known as: VALTREX Take 2,000 mg by mouth 2 (two) times daily as needed (fever blisters).        Diagnostic Studies: DG Shoulder Left Port  Result Date: 03/07/2022 CLINICAL DATA:  Status post reverse shoulder arthroplasty. EXAM: LEFT SHOULDER COMPARISON:  February 18, 2022 FINDINGS: Post total left shoulder arthroplasty with normal alignment of the orthopedic hardware. Redemonstrated is left humeral neck comminuted fracture. IMPRESSION: 1. Post total left shoulder arthroplasty without evidence of immediate complications. 2. Left humeral neck comminuted fracture. Electronically Signed   By: Fidela Salisbury M.D.   On: 03/07/2022 14:27   CT  SHOULDER LEFT WO CONTRAST  Result Date: 02/26/2022 CLINICAL DATA:  Fall on 02/15/2022.  Left proximal humeral fracture. EXAM: CT OF THE UPPER LEFT EXTREMITY WITHOUT CONTRAST TECHNIQUE: Multidetector CT imaging of the upper left extremity was performed according to the standard protocol. RADIATION DOSE REDUCTION: This exam was performed according to the departmental dose-optimization program which includes automated exposure control, adjustment of the mA and/or kV according to patient size and/or use of iterative reconstruction technique. COMPARISON:  Radiograph dated February 18, 2022 FINDINGS: Bones/Joint/Cartilage There is a comminuted impaction fracture of the left proximal humerus involving the femoral neck, head, greater and lesser tuberosities. There is approximately 2 cm superior displacement of the humeral neck/shaft. There is approximately 1.5 cm posterior displacement of the humeral head. The fracture extends into the articular surface of the humeral head. No appreciable fracture of the scapula. No appreciable rib fracture. Mild arthritic changes of the acromioclavicular joint and glenohumeral joints. Ligaments Suboptimally assessed by CT. Muscles and Tendons Soft tissue swelling and edema about the comminuted fracture. Muscles and tendons appear intact. Soft tissues No significant hematoma or fluid collection. IMPRESSION: 1. Comminuted impaction fracture of the left proximal humerus involving femoral neck, head, greater and lesser tuberosities with 2 cm superior displacement of the humeral neck as well as 1.5 cm posterior displacement of the humeral head. The findings likely represent 3 part fracture (Neer classification). 2. Mild arthritic changes of the acromioclavicular and glenohumeral joints. 3.  Soft tissue swelling and edema about the comminuted fracture. Electronically Signed   By: Keane Police D.O.   On: 02/26/2022 17:58   DG Shoulder Left  Result Date: 02/18/2022 CLINICAL DATA:  LEFT  shoulder pain, fell yesterday EXAM: LEFT SHOULDER - 2+ VIEW COMPARISON:  None FINDINGS: Osseous demineralization. AC joint alignment normal. Inferior acromial spur formation. Comminuted fracture involving the surgical neck and head of LEFT humerus, minimally displaced. No dislocation. Visualized ribs intact. IMPRESSION: Comminuted minimally displaced fracture involving the head and surgical neck of LEFT humerus. Electronically Signed   By: Lavonia Dana M.D.   On: 02/18/2022 12:39    Disposition: Discharge disposition: 01-Home or Self Care          Follow-up Information     Marchia Bond, MD. Schedule an appointment as soon as possible for a visit in 2 week(s).   Specialty: Orthopedic Surgery Contact information: [place] [phone]                  Signed: Jola Baptist 03/08/2022, 2:45 PM

## 2022-03-08 NOTE — Progress Notes (Signed)
     Subjective: 1 Day Post-Op s/p Procedure(s): REVERSE SHOULDER ARTHROPLASTY   Patient is alert, oriented. Feeling starting to come back in hand. Patient worried as he has not yet had bowel movement yet. Pain so far well controlled. Denies chest pain, SOB, Calf pain. No nausea/vomiting. No other complaints.   Objective:  PE: VITALS:   Vitals:   03/07/22 1250 03/07/22 2328 03/08/22 0426 03/08/22 0908  BP: (!) 154/88 (!) 155/92 (!) 156/89 (!) 141/95  Pulse: 76 84 79 76  Resp: '18 16 16 18  '$ Temp:  98.1 F (36.7 C) 98.1 F (36.7 C) 98.1 F (36.7 C)  TempSrc:    Oral  SpO2: 98% 98% 97% 98%  Weight:      Height:       General: sitting up in bed, in no acute distress Resp: normal respiratory effort GI: soft, nontender MSK: Left upper extremity in sling, but fitting poorly. This was readjusted so elbow is in corner of sling. All fingers of left hand flex, extend, and abduct. Distal sensation intact. 2+ radial pulse.    LABS  No results found for this or any previous visit (from the past 24 hour(s)).  DG Shoulder Left Port  Result Date: 03/07/2022 CLINICAL DATA:  Status post reverse shoulder arthroplasty. EXAM: LEFT SHOULDER COMPARISON:  February 18, 2022 FINDINGS: Post total left shoulder arthroplasty with normal alignment of the orthopedic hardware. Redemonstrated is left humeral neck comminuted fracture. IMPRESSION: 1. Post total left shoulder arthroplasty without evidence of immediate complications. 2. Left humeral neck comminuted fracture. Electronically Signed   By: Fidela Salisbury M.D.   On: 03/07/2022 14:27    Assessment/Plan: Displaced left proximal humerus fracture  1 Day Post-Op s/p Procedure(s): REVERSE SHOULDER ARTHROPLASTY  Weightbearing: NWB LUE, Unrestricted AROM at left wrist and all fingers of left hand. OK for AROM of left elbow as long as no passive or active ROM at shoulder.  Insicional and dressing care: reinforce dressing as needed Orthopedic  device(s): sling VTE prophylaxis: SCDS, early ambulation Pain control: continue current regimen Follow - up plan: 2 weeks with Dr. Mardelle Matte Dispo: pending working with OT, plan to discharge after OT  Contact information:   Merlene Pulling, PA-C Weekdays 8-5  After hours and holidays please check [email or web address] for group call information for [facility]  Ventura Bruns 03/08/2022, 9:22 AM

## 2022-03-08 NOTE — Discharge Instructions (Signed)
Diet: As you were doing prior to hospitalization   Shower:  May shower but keep the wounds dry, use an occlusive plastic wrap. If dressing gets wet, you can change with clean dry gauze.   Dressing:  Keep dressing in place, if you are seeing significant bloody drainage from your dressing, please call our office. We will change your bandages during your first follow-up appointment.  There are sticky tapes (steri-strips) on your wounds and all the stitches are absorbable.  Leave the steri-strips in place if you need to change your dressing, they will peel off with time, usually 2-3 weeks.  Activity:  Increase activity slowly as tolerated, but follow the weight bearing instructions below.  The rules on driving is that you can not be taking narcotics while you drive, and you must feel in control of the vehicle.    Weight Bearing:   No bearing weight with left arm. Keep in sling.   To prevent constipation: you may use a stool softener such as -  Colace (over the counter) 100 mg by mouth twice a day  Drink plenty of fluids (prune juice may be helpful) and high fiber foods Miralax (over the counter) for constipation as needed.    Itching:  If you experience itching with your medications, try taking only a single pain pill, or even half a pain pill at a time.  You may take up to 10 pain pills per day, and you can also use benadryl over the counter for itching or also to help with sleep.   Precautions:  If you experience chest pain or shortness of breath - call 911 immediately for transfer to the hospital emergency department!!  If you develop a fever greater that 101 F, purulent drainage from wound, increased redness or drainage from wound, or calf pain -- Call the office at [phone]                                                Follow- Up Appointment:  Please call for an appointment to be seen in 2 weeks [place] - [phone]

## 2022-03-08 NOTE — Evaluation (Signed)
Occupational Therapy Evaluation Patient Details Name: Geoffrey Patterson MRN: [id number] DOB: [birth year] Today's Date: 03/08/2022   History of Present Illness 71 yo male s/p left reverse shoulder arthroplasty on 9/24. PMH including gout and HTN.   Clinical Impression   PTA, pt was living alone and was independent; working full time with his nephew. Pt's sister planning to check in regularly at dc. Pt currently requiring Mod-Max A for ADLs. Focused session on precautions and LUE exercises at hand, wrist, forearm, and elbow. Pt performing AROM/AAROM x10.  Reviewing shoulder precautions and initiating education on compensatory techniques for ADLs. Lunch arriving. Will return for second session to address ADLs including bathing, dressing, and sling management. Recommend dc to home with follow up at OP once cleared by MD.       Recommendations for follow up therapy are one component of a multi-disciplinary discharge planning process, led by the attending physician.  Recommendations may be updated based on patient status, additional functional criteria and insurance authorization.   Follow Up Recommendations  Follow physician's recommendations for discharge plan and follow up therapies    Assistance Recommended at Discharge Intermittent Supervision/Assistance  Patient can return home with the following      Functional Status Assessment  Patient has had a recent decline in their functional status and demonstrates the ability to make significant improvements in function in a reasonable and predictable amount of time.  Equipment Recommendations  None recommended by OT    Recommendations for Other Services       Precautions / Restrictions Precautions Precautions: Shoulder Type of Shoulder Precautions: No ROM of shoulder. Cleared for AROM of hand, wrist, elbow, and forearm. Shoulder Interventions: Shoulder sling/immobilizer;At all times;Off for dressing/bathing/exercises Precaution Booklet  Issued: Yes (comment) Precaution Comments: No ROM of shoulder Required Braces or Orthoses: Sling Restrictions Weight Bearing Restrictions: Yes LUE Weight Bearing: Non weight bearing      Mobility Bed Mobility               General bed mobility comments: OOB with nursing upon arrival    Transfers                   General transfer comment: Sitting in recliner after going to bathroom with nursing staff      Balance Overall balance assessment: Needs assistance Sitting-balance support: No upper extremity supported, Feet supported Sitting balance-Leahy Scale: Good                                     ADL either performed or assessed with clinical judgement   ADL Overall ADL's : Needs assistance/impaired Eating/Feeding: Set up;Sitting   Grooming: Set up;Sitting   Upper Body Bathing: Moderate assistance;Sitting   Lower Body Bathing: Moderate assistance;Sit to/from stand   Upper Body Dressing : Maximal assistance;Sitting   Lower Body Dressing: Moderate assistance;Sit to/from stand                 General ADL Comments: Initating education on compensatory techniques for ADLs. Will return to provide further education. Reviewing exercises and ROM restrictions. Also discussing bed position and pt reports he will probably sleep in his lazy boy at first     Vision         Perception     Praxis      Pertinent Vitals/Pain Pain Assessment Pain Assessment: Faces Faces Pain Scale: Hurts little more Pain Location: L shoulder Pain Descriptors /  Indicators: Discomfort Pain Intervention(s): Monitored during session, Repositioned     Hand Dominance Right   Extremity/Trunk Assessment Upper Extremity Assessment Upper Extremity Assessment: LUE deficits/detail LUE Deficits / Details: No ROM per MD order. Still  numb from sx at hand and forearm. Able to perform AROM of hand and wrist. Difficult with pronation/supination and elbow flexion LUE  Coordination: decreased fine motor;decreased gross motor   Lower Extremity Assessment Lower Extremity Assessment: Overall WFL for tasks assessed   Cervical / Trunk Assessment Cervical / Trunk Assessment: Normal   Communication Communication Communication: No difficulties (Stutter and needing increased time)   Cognition Arousal/Alertness: Awake/alert Behavior During Therapy: WFL for tasks assessed/performed Overall Cognitive Status: Within Functional Limits for tasks assessed                                       General Comments       Exercises Exercises: Shoulder Shoulder Exercises Elbow Flexion: AAROM, Left, 10 reps, Seated Elbow Extension: AAROM, Left, 10 reps, Seated Wrist Flexion: AROM, Left, 10 reps, Seated Wrist Extension: AROM, Left, 10 reps, Seated Digit Composite Flexion: AROM, Left, 10 reps, Seated Composite Extension: AROM, Left, 10 reps, Seated   Shoulder Instructions      Home Living Family/patient expects to be discharged to:: Private residence Living Arrangements: Alone Available Help at Discharge: Family;Available PRN/intermittently (Sister planning to check in frequently) Type of Home: House Home Access: Stairs to enter [employer] of Steps: 3 Entrance Stairs-Rails: Right;Left Home Layout: Able to live on main level with bedroom/bathroom;Two level     Bathroom Shower/Tub: Occupational psychologist: Standard     Home Equipment: Shower seat;Hand held shower head          Prior Functioning/Environment Prior Level of Function : Independent/Modified Independent                        OT Problem List: Decreased range of motion;Decreased activity tolerance;Decreased knowledge of use of DME or AE;Decreased knowledge of precautions;Impaired UE functional use;Pain      OT Treatment/Interventions: Self-care/ADL training;Therapeutic exercise;Energy conservation;DME and/or AE instruction;Therapeutic  activities;Patient/family education    OT Goals(Current goals can be found in the care plan section) Acute Rehab OT Goals Patient Stated Goal: Go home later today OT Goal Formulation: With patient Time For Goal Achievement: 03/22/22 Potential to Achieve Goals: Good  OT Frequency: Min 3X/week    Co-evaluation              AM-PAC OT "6 Clicks" Daily Activity     Outcome Measure Help from another person eating meals?: A Little Help from another person taking care of personal grooming?: A Little Help from another person toileting, which includes using toliet, bedpan, or urinal?: A Lot Help from another person bathing (including washing, rinsing, drying)?: A Lot Help from another person to put on and taking off regular upper body clothing?: A Lot Help from another person to put on and taking off regular lower body clothing?: A Lot 6 Click Score: 14   End of Session Equipment Utilized During Treatment: Other (comment) (sling) Nurse Communication: Mobility status  Activity Tolerance: Patient tolerated treatment well Patient left: in chair;with call bell/phone within reach  OT Visit Diagnosis: Unsteadiness on feet (R26.81);Other abnormalities of gait and mobility (R26.89);Muscle weakness (generalized) (M62.81);Pain Pain - Right/Left: Left Pain - part of body: Shoulder  Time: 8588-5027 OT Time Calculation (min): 16 min Charges:  OT General Charges $OT Visit: 1 Visit OT Evaluation $OT Eval Low Complexity: 1 Low  Mc Bloodworth MSOT, OTR/L Acute Rehab Office: [place] 03/08/2022, 10:23 AM

## 2022-03-08 NOTE — Progress Notes (Addendum)
Occupational Therapy Treatment Patient Details Name: Geoffrey Patterson MRN: [id number] DOB: 12-Dec-1950 Today's Date: 03/08/2022   History of present illness 71 yo male s/p left reverse shoulder arthroplasty on 9/24. PMH including gout and HTN.   OT comments  Returned for second session to focus on education for compensatory techniques with ADLs. Pt performing UB/LB bathing and dressing with Supervision and cues for compensatory techniques. Min A for managing sling. Pt demonstrating understanding of techniques and adhering to precautions. Providing all education and answering questions in preparation for dc later today. Will follow acutely as admitted to facilitate safe dc.    Recommendations for follow up therapy are one component of a multi-disciplinary discharge planning process, led by the attending physician.  Recommendations may be updated based on patient status, additional functional criteria and insurance authorization.    Follow Up Recommendations  Follow physician's recommendations for discharge plan and follow up therapies    Assistance Recommended at Discharge Intermittent Supervision/Assistance  Patient can return home with the following      Equipment Recommendations  None recommended by OT    Recommendations for Other Services      Precautions / Restrictions Precautions Precautions: Shoulder Type of Shoulder Precautions: No ROM of shoulder. Cleared for AROM of hand, wrist, elbow, and forearm. Shoulder Interventions: Shoulder sling/immobilizer;At all times;Off for dressing/bathing/exercises Precaution Booklet Issued: Yes (comment) Precaution Comments: No ROM of shoulder Required Braces or Orthoses: Sling Restrictions Weight Bearing Restrictions: Yes LUE Weight Bearing: Non weight bearing       Mobility Bed Mobility               General bed mobility comments: OOB with nursing upon arrival    Transfers Overall transfer level: Needs assistance    Transfers: Sit to/from Stand Sit to Stand: Supervision           General transfer comment: Sitting in recliner after going to bathroom with nursing staff     Balance Overall balance assessment: No apparent balance deficits (not formally assessed) Sitting-balance support: No upper extremity supported, Feet supported Sitting balance-Leahy Scale: Good                                     ADL either performed or assessed with clinical judgement   ADL Overall ADL's : Needs assistance/impaired Eating/Feeding: Set up;Sitting   Grooming: Set up;Sitting   Upper Body Bathing: Minimal assistance;Sitting Upper Body Bathing Details (indicate cue type and reason): Educating pt on compensatory tehcniques for bathing; cues to sit while bathing and lean arm forward at LE for support and to reduce ROM of shoulder. Pt demonstrating understanding. Min A to wash patient's back. Overall demonstrating understanding of compensatory techniques Lower Body Bathing: Supervison/ safety;Sit to/from stand   Upper Body Dressing : Supervision/safety;Set up;Sitting;Minimal assistance Upper Body Dressing Details (indicate cue type and reason): Educating pt on donning shirt and sling. Pt donning shirt with Supervision and cues for sequencing. Pt requiring Min A for strap placement to manage sling. Lower Body Dressing: Supervision/safety;Set up;Sit to/from stand Lower Body Dressing Details (indicate cue type and reason): donning underwear and pants Toilet Transfer: Supervision/safety;Ambulation;Regular Toilet   Toileting- Water quality scientist and Hygiene: Independent;Sitting/lateral lean;Sit to/from stand       Functional mobility during ADLs: Supervision/safety General ADL Comments: Pt performing bathing and dressing demonstratung understanding of compensatory techniques.    Extremity/Trunk Assessment Upper Extremity Assessment Upper Extremity Assessment: LUE deficits/detail LUE Deficits /  Details: No ROM per MD order. Still  numb from sx at hand and forearm. Able to perform AROM of hand and wrist. Difficult with pronation/supination and elbow flexion LUE Coordination: decreased fine motor;decreased gross motor   Lower Extremity Assessment Lower Extremity Assessment: Overall WFL for tasks assessed   Cervical / Trunk Assessment Cervical / Trunk Assessment: Normal    Vision       Perception     Praxis      Cognition Arousal/Alertness: Awake/alert Behavior During Therapy: WFL for tasks assessed/performed Overall Cognitive Status: Within Functional Limits for tasks assessed                                          Exercises Exercises: Shoulder Shoulder Exercises Elbow Flexion: AAROM, Left, 10 reps, Seated Elbow Extension: AAROM, Left, 10 reps, Seated Wrist Flexion: AROM, Left, 10 reps, Seated Wrist Extension: AROM, Left, 10 reps, Seated Digit Composite Flexion: AROM, Left, 10 reps, Seated Composite Extension: AROM, Left, 10 reps, Seated    Shoulder Instructions Shoulder Instructions Donning/doffing shirt without moving shoulder: Supervision/safety;Set-up Method for sponge bathing under operated UE: Supervision/safety;Set-up Donning/doffing sling/immobilizer: Minimal assistance Correct positioning of sling/immobilizer: Minimal assistance ROM for elbow, wrist and digits of operated UE: Independent Sling wearing schedule (on at all times/off for ADL's): Independent Proper positioning of operated UE when showering: Independent     General Comments      Pertinent Vitals/ Pain       Pain Assessment Pain Assessment: Faces Faces Pain Scale: Hurts a little bit Pain Location: L shoulder Pain Descriptors / Indicators: Discomfort Pain Intervention(s): Monitored during session, Repositioned, Ice applied  Home Living Family/patient expects to be discharged to:: Private residence Living Arrangements: Alone Available Help at Discharge:  Family;Available PRN/intermittently (Sister planning to check in frequently) Type of Home: House Home Access: Stairs to enter [employer] of Steps: 3 Entrance Stairs-Rails: Right;Left Home Layout: Able to live on main level with bedroom/bathroom;Two level     Bathroom Shower/Tub: Occupational psychologist: Standard     Home Equipment: Shower seat;Hand held shower head          Prior Functioning/Environment              Frequency  Min 3X/week        Progress Toward Goals  OT Goals(current goals can now be found in the care plan section)  Progress towards OT goals: Progressing toward goals  Acute Rehab OT Goals Patient Stated Goal: Go home later today OT Goal Formulation: With patient Time For Goal Achievement: 03/22/22 Potential to Achieve Goals: Good ADL Goals Pt Will Perform Upper Body Dressing: with modified independence;sitting Pt Will Perform Lower Body Dressing: with modified independence;sit to/from stand Pt Will Transfer to Toilet: with modified independence;ambulating;regular height toilet Pt/caregiver will Perform Home Exercise Program: Increased ROM;Increased strength;Left upper extremity;With written HEP provided;With Supervision  Plan Discharge plan remains appropriate    Co-evaluation                 AM-PAC OT "6 Clicks" Daily Activity     Outcome Measure   Help from another person eating meals?: A Little Help from another person taking care of personal grooming?: A Little Help from another person toileting, which includes using toliet, bedpan, or urinal?: A Little Help from another person bathing (including washing, rinsing, drying)?: A Little Help from another person to put on  and taking off regular upper body clothing?: A Little Help from another person to put on and taking off regular lower body clothing?: A Little 6 Click Score: 18    End of Session Equipment Utilized During Treatment: Other (comment)  (sling)  OT Visit Diagnosis: Unsteadiness on feet (R26.81);Other abnormalities of gait and mobility (R26.89);Muscle weakness (generalized) (M62.81);Pain Pain - Right/Left: Left Pain - part of body: Shoulder   Activity Tolerance Patient tolerated treatment well   Patient Left in chair;with call bell/phone within reach   Nurse Communication Mobility status        Time: 7793-9030 OT Time Calculation (min): 51 min  Charges: OT General Charges $OT Visit: 1 Visit OT Treatments $Self Care/Home Management : 38-52 mins  Anabelen Kaminsky MSOT, OTR/L Acute Rehab Office: [place] 03/08/2022, 12:54 PM

## 2022-03-14 ENCOUNTER — Other Ambulatory Visit: Payer: Self-pay

## 2022-03-14 ENCOUNTER — Inpatient Hospital Stay (HOSPITAL_COMMUNITY)
Admission: EM | Admit: 2022-03-14 | Discharge: 2022-03-22 | DRG: 682 | Disposition: A | Discharge status: Skilled Nursing Facility | Payer: Medicare HMO | Attending: Internal Medicine | Admitting: Internal Medicine

## 2022-03-14 ENCOUNTER — Encounter (HOSPITAL_COMMUNITY): Payer: Self-pay | Admitting: *Deleted

## 2022-03-14 ENCOUNTER — Emergency Department (HOSPITAL_COMMUNITY): Payer: Medicare HMO

## 2022-03-14 DIAGNOSIS — Z79899 Other long term (current) drug therapy: Secondary | ICD-10-CM

## 2022-03-14 DIAGNOSIS — R531 Weakness: Secondary | ICD-10-CM | POA: Diagnosis not present

## 2022-03-14 DIAGNOSIS — E875 Hyperkalemia: Secondary | ICD-10-CM | POA: Diagnosis not present

## 2022-03-14 DIAGNOSIS — Z8249 Family history of ischemic heart disease and other diseases of the circulatory system: Secondary | ICD-10-CM | POA: Diagnosis not present

## 2022-03-14 DIAGNOSIS — E872 Acidosis, unspecified: Secondary | ICD-10-CM | POA: Diagnosis not present

## 2022-03-14 DIAGNOSIS — K649 Unspecified hemorrhoids: Secondary | ICD-10-CM | POA: Diagnosis not present

## 2022-03-14 DIAGNOSIS — I10 Essential (primary) hypertension: Secondary | ICD-10-CM

## 2022-03-14 DIAGNOSIS — I4891 Unspecified atrial fibrillation: Secondary | ICD-10-CM

## 2022-03-14 DIAGNOSIS — R0600 Dyspnea, unspecified: Secondary | ICD-10-CM | POA: Diagnosis not present

## 2022-03-14 DIAGNOSIS — N179 Acute kidney failure, unspecified: Principal | ICD-10-CM

## 2022-03-14 DIAGNOSIS — I2489 Other forms of acute ischemic heart disease: Secondary | ICD-10-CM | POA: Diagnosis not present

## 2022-03-14 DIAGNOSIS — R5383 Other fatigue: Secondary | ICD-10-CM | POA: Diagnosis not present

## 2022-03-14 DIAGNOSIS — R3916 Straining to void: Secondary | ICD-10-CM | POA: Diagnosis present

## 2022-03-14 DIAGNOSIS — Z87891 Personal history of nicotine dependence: Secondary | ICD-10-CM | POA: Diagnosis not present

## 2022-03-14 DIAGNOSIS — J189 Pneumonia, unspecified organism: Secondary | ICD-10-CM | POA: Diagnosis not present

## 2022-03-14 DIAGNOSIS — Z96612 Presence of left artificial shoulder joint: Secondary | ICD-10-CM | POA: Diagnosis present

## 2022-03-14 DIAGNOSIS — N401 Enlarged prostate with lower urinary tract symptoms: Secondary | ICD-10-CM | POA: Diagnosis not present

## 2022-03-14 DIAGNOSIS — R32 Unspecified urinary incontinence: Secondary | ICD-10-CM | POA: Diagnosis present

## 2022-03-14 DIAGNOSIS — E871 Hypo-osmolality and hyponatremia: Secondary | ICD-10-CM | POA: Diagnosis present

## 2022-03-14 DIAGNOSIS — R7989 Other specified abnormal findings of blood chemistry: Secondary | ICD-10-CM

## 2022-03-14 DIAGNOSIS — N3289 Other specified disorders of bladder: Secondary | ICD-10-CM | POA: Diagnosis not present

## 2022-03-14 DIAGNOSIS — R338 Other retention of urine: Secondary | ICD-10-CM | POA: Diagnosis not present

## 2022-03-14 DIAGNOSIS — Z743 Need for continuous supervision: Secondary | ICD-10-CM | POA: Diagnosis not present

## 2022-03-14 DIAGNOSIS — U071 COVID-19: Secondary | ICD-10-CM | POA: Diagnosis not present

## 2022-03-14 DIAGNOSIS — E860 Dehydration: Secondary | ICD-10-CM | POA: Diagnosis not present

## 2022-03-14 DIAGNOSIS — K5900 Constipation, unspecified: Secondary | ICD-10-CM | POA: Diagnosis not present

## 2022-03-14 DIAGNOSIS — R0602 Shortness of breath: Secondary | ICD-10-CM | POA: Diagnosis not present

## 2022-03-14 DIAGNOSIS — E669 Obesity, unspecified: Secondary | ICD-10-CM | POA: Diagnosis not present

## 2022-03-14 DIAGNOSIS — M109 Gout, unspecified: Secondary | ICD-10-CM | POA: Diagnosis not present

## 2022-03-14 DIAGNOSIS — Z6832 Body mass index (BMI) 32.0-32.9, adult: Secondary | ICD-10-CM | POA: Diagnosis not present

## 2022-03-14 DIAGNOSIS — J168 Pneumonia due to other specified infectious organisms: Secondary | ICD-10-CM | POA: Diagnosis not present

## 2022-03-14 DIAGNOSIS — R778 Other specified abnormalities of plasma proteins: Secondary | ICD-10-CM | POA: Diagnosis not present

## 2022-03-14 LAB — TROPONIN I (HIGH SENSITIVITY)
Troponin I (High Sensitivity): 84 ng/L — ABNORMAL HIGH (ref <18)
Troponin I (High Sensitivity): 63 ng/L — ABNORMAL HIGH (ref <18)

## 2022-03-14 LAB — URINALYSIS, ROUTINE W REFLEX MICROSCOPIC
Bacteria, UA: NONE SEEN
Bilirubin Urine: NEGATIVE
Glucose, UA: NEGATIVE mg/dL
Ketones, ur: NEGATIVE mg/dL
Nitrite: NEGATIVE
Protein, ur: 100 mg/dL — AB
RBC / HPF: >50 RBC/hpf — ABNORMAL HIGH (ref 0–5)
Specific Gravity, Urine: 1.01 (ref 1.005–1.030)
WBC, UA: >50 WBC/hpf — ABNORMAL HIGH (ref 0–5)
pH: 6 (ref 5.0–8.0)

## 2022-03-14 LAB — COMPREHENSIVE METABOLIC PANEL
ALT: 20 U/L (ref 0–44)
AST: 42 U/L — ABNORMAL HIGH (ref 15–41)
Albumin: 2.7 g/dL — ABNORMAL LOW (ref 3.5–5.0)
Alkaline Phosphatase: 154 U/L — ABNORMAL HIGH (ref 38–126)
Anion gap: 19 — ABNORMAL HIGH (ref 5–15)
BUN: 160 mg/dL — ABNORMAL HIGH (ref 8–23)
CO2: 13 mmol/L — ABNORMAL LOW (ref 22–32)
Calcium: 8 mg/dL — ABNORMAL LOW (ref 8.9–10.3)
Chloride: 99 mmol/L (ref 98–111)
Creatinine, Ser: 12.49 mg/dL — ABNORMAL HIGH (ref 0.61–1.24)
GFR, Estimated: 4 mL/min — ABNORMAL LOW (ref >60)
Glucose, Bld: 116 mg/dL — ABNORMAL HIGH (ref 70–99)
Potassium: 5.9 mmol/L — ABNORMAL HIGH (ref 3.5–5.1)
Sodium: 131 mmol/L — ABNORMAL LOW (ref 135–145)
Total Bilirubin: 1 mg/dL (ref 0.3–1.2)
Total Protein: 5.9 g/dL — ABNORMAL LOW (ref 6.5–8.1)

## 2022-03-14 LAB — CBC WITH DIFFERENTIAL/PLATELET
Abs Immature Granulocytes: 0.08 10*3/uL — ABNORMAL HIGH (ref 0.00–0.07)
Basophils Absolute: 0 10*3/uL (ref 0.0–0.1)
Basophils Relative: 0 %
Eosinophils Absolute: 0.2 10*3/uL (ref 0.0–0.5)
Eosinophils Relative: 2 %
HCT: 34.2 % — ABNORMAL LOW (ref 39.0–52.0)
Hemoglobin: 12.2 g/dL — ABNORMAL LOW (ref 13.0–17.0)
Immature Granulocytes: 1 %
Lymphocytes Relative: 4 %
Lymphs Abs: 0.6 10*3/uL — ABNORMAL LOW (ref 0.7–4.0)
MCH: 33.2 pg (ref 26.0–34.0)
MCHC: 35.7 g/dL (ref 30.0–36.0)
MCV: 93.2 fL (ref 80.0–100.0)
Monocytes Absolute: 1 10*3/uL (ref 0.1–1.0)
Monocytes Relative: 7 %
Neutro Abs: 12.1 10*3/uL — ABNORMAL HIGH (ref 1.7–7.7)
Neutrophils Relative %: 86 %
Platelets: 165 10*3/uL (ref 150–400)
RBC: 3.67 MIL/uL — ABNORMAL LOW (ref 4.22–5.81)
RDW: 12.4 % (ref 11.5–15.5)
WBC: 14.1 10*3/uL — ABNORMAL HIGH (ref 4.0–10.5)
nRBC: 0 % (ref 0.0–0.2)

## 2022-03-14 LAB — MRSA NEXT GEN BY PCR, NASAL: MRSA by PCR Next Gen: NOT DETECTED

## 2022-03-14 LAB — RESP PANEL BY RT-PCR (FLU A&B, COVID) ARPGX2
Influenza A by PCR: NEGATIVE
Influenza B by PCR: NEGATIVE
SARS Coronavirus 2 by RT PCR: NEGATIVE

## 2022-03-14 LAB — PROCALCITONIN: Procalcitonin: 0.36 ng/mL

## 2022-03-14 LAB — LACTIC ACID, PLASMA
Lactic Acid, Venous: 1.1 mmol/L (ref 0.5–1.9)
Lactic Acid, Venous: 1.6 mmol/L (ref 0.5–1.9)

## 2022-03-14 LAB — CK: Total CK: 49 U/L (ref 49–397)

## 2022-03-14 LAB — BRAIN NATRIURETIC PEPTIDE: B Natriuretic Peptide: 79 pg/mL (ref 0.0–100.0)

## 2022-03-14 MED ORDER — ONDANSETRON HCL 4 MG PO TABS: 4.0000 mg | ORAL_TABLET | Freq: Four times a day (QID) | ORAL | Status: DC | PRN

## 2022-03-14 MED ORDER — HEPARIN SODIUM (PORCINE) 5000 UNIT/ML IJ SOLN
5000.0000 [IU] | Freq: Three times a day (TID) | INTRAMUSCULAR | Status: DC
  Administered 2022-03-14 – 2022-03-22 (×23): 5000 [IU] via SUBCUTANEOUS
  Filled 2022-03-14 (×23): qty 1

## 2022-03-14 MED ORDER — SODIUM CHLORIDE 0.9 % IV BOLUS
500.0000 mL | Freq: Once | INTRAVENOUS | Status: AC
  Administered 2022-03-14: 500 mL via INTRAVENOUS

## 2022-03-14 MED ORDER — SODIUM CHLORIDE 0.9 % IV SOLN: 2.0000 g | Freq: Three times a day (TID) | INTRAVENOUS | Status: DC

## 2022-03-14 MED ORDER — ALLOPURINOL 100 MG PO TABS: 300.0000 mg | ORAL_TABLET | Freq: Every day | ORAL | Status: DC

## 2022-03-14 MED ORDER — SODIUM CHLORIDE 0.9 % IV SOLN
1.0000 g | INTRAVENOUS | Status: DC
  Filled 2022-03-14: qty 10

## 2022-03-14 MED ORDER — SODIUM BICARBONATE 8.4 % IV SOLN
50.0000 meq | Freq: Once | INTRAVENOUS | Status: AC
  Administered 2022-03-14: 50 meq via INTRAVENOUS

## 2022-03-14 MED ORDER — ONDANSETRON HCL 4 MG/2ML IJ SOLN: 4.0000 mg | Freq: Four times a day (QID) | INTRAMUSCULAR | Status: DC | PRN

## 2022-03-14 MED ORDER — LACTATED RINGERS IV BOLUS (SEPSIS): 500.0000 mL | Freq: Once | INTRAVENOUS | Status: DC

## 2022-03-14 MED ORDER — SODIUM CHLORIDE 0.9 % IV SOLN: INTRAVENOUS | Status: DC

## 2022-03-14 MED ORDER — VANCOMYCIN HCL 2000 MG/400ML IV SOLN
2000.0000 mg | Freq: Once | INTRAVENOUS | Status: AC
  Administered 2022-03-14: 2000 mg via INTRAVENOUS
  Filled 2022-03-14: qty 400

## 2022-03-14 MED ORDER — ACETAMINOPHEN 325 MG PO TABS
650.0000 mg | ORAL_TABLET | Freq: Four times a day (QID) | ORAL | Status: DC | PRN
  Administered 2022-03-14 – 2022-03-21 (×12): 650 mg via ORAL
  Filled 2022-03-14 (×13): qty 2

## 2022-03-14 MED ORDER — HYDRALAZINE HCL 20 MG/ML IJ SOLN: 10.0000 mg | INTRAMUSCULAR | Status: DC | PRN

## 2022-03-14 MED ORDER — VANCOMYCIN HCL 750 MG/150ML IV SOLN
750.0000 mg | Freq: Two times a day (BID) | INTRAVENOUS | Status: DC
  Filled 2022-03-14: qty 150

## 2022-03-14 MED ORDER — LACTATED RINGERS IV SOLN: INTRAVENOUS | Status: DC

## 2022-03-14 MED ORDER — LACTATED RINGERS IV BOLUS (SEPSIS)
1000.0000 mL | Freq: Once | INTRAVENOUS | Status: AC
  Administered 2022-03-14: 1000 mL via INTRAVENOUS

## 2022-03-14 MED ORDER — SODIUM CHLORIDE 0.9 % IV SOLN
2.0000 g | Freq: Once | INTRAVENOUS | Status: AC
  Administered 2022-03-14: 2 g via INTRAVENOUS
  Filled 2022-03-14: qty 12.5

## 2022-03-14 MED ORDER — SODIUM BICARBONATE 8.4 % IV SOLN
INTRAVENOUS | Status: AC
  Filled 2022-03-14: qty 50

## 2022-03-14 MED ORDER — LACTATED RINGERS IV BOLUS (SEPSIS): 1000.0000 mL | Freq: Once | INTRAVENOUS | Status: DC

## 2022-03-14 MED ORDER — SODIUM CHLORIDE 0.9 % IV SOLN
2.0000 g | INTRAVENOUS | Status: DC
  Administered 2022-03-15 – 2022-03-17 (×3): 2 g via INTRAVENOUS
  Filled 2022-03-14 (×3): qty 20

## 2022-03-14 MED ORDER — POLYETHYLENE GLYCOL 3350 17 G PO PACK: 17.0000 g | PACK | Freq: Every day | ORAL | Status: DC | PRN

## 2022-03-14 MED ORDER — SODIUM CHLORIDE 0.9 % IV SOLN
500.0000 mg | INTRAVENOUS | Status: DC
  Administered 2022-03-14 – 2022-03-17 (×4): 500 mg via INTRAVENOUS
  Filled 2022-03-14 (×4): qty 5

## 2022-03-14 MED ORDER — TAMSULOSIN HCL 0.4 MG PO CAPS
0.4000 mg | ORAL_CAPSULE | Freq: Every day | ORAL | Status: DC
  Administered 2022-03-15 – 2022-03-21 (×7): 0.4 mg via ORAL
  Filled 2022-03-14 (×7): qty 1

## 2022-03-14 MED ORDER — ACETAMINOPHEN 650 MG RE SUPP: 650.0000 mg | Freq: Four times a day (QID) | RECTAL | Status: DC | PRN

## 2022-03-14 MED ORDER — VANCOMYCIN HCL IN DEXTROSE 1-5 GM/200ML-% IV SOLN: 1000.0000 mg | Freq: Once | INTRAVENOUS | Status: DC

## 2022-03-14 MED ORDER — DILTIAZEM HCL ER COATED BEADS 120 MG PO CP24
120.0000 mg | ORAL_CAPSULE | Freq: Every day | ORAL | Status: DC
  Administered 2022-03-15 – 2022-03-22 (×8): 120 mg via ORAL
  Filled 2022-03-14 (×9): qty 1

## 2022-03-14 NOTE — Assessment & Plan Note (Signed)
Rate controlled, with diltiazem.  Currently not on anticoagulation.  Previously on anticoagulation with with Eliquis. -Resume diltiazem -Will need to find out why patient is no longer on anticoagulation

## 2022-03-14 NOTE — Progress Notes (Addendum)
Pharmacy Antibiotic Note  Geoffrey Patterson a 71 y.o. male admitted on 03/14/2022 with pneumonia.  Pharmacy has been consulted for vancomycin and cefepime dosing.  Plan: Vancomycin '750mg'$  IV every 12 hours.  Goal trough 15-20 mcg/mL. Cefepime 2gm IV every 8 hours.  Medical History: Past Medical History:  Diagnosis Date   Dysrhythmia    a-fib history   Gout    Hypertension     Allergies:  No Known Allergies  Filed Weights   03/14/22 1239  Weight: 108.9 kg (240 lb)       Latest Ref Rng & Units 03/14/2022    1:30 PM 03/07/2022    7:11 AM  CBC  WBC 4.0 - 10.5 K/uL 14.1  8.6   Hemoglobin 13.0 - 17.0 g/dL 12.2  15.0   Hematocrit 39.0 - 52.0 % 34.2  43.2   Platelets 150 - 400 K/uL 165  265      Estimated Creatinine Clearance: 84.7 mL/min (by C-G formula based on SCr of 0.99 mg/dL).  Antibiotics Given (last 72 hours)     None       Antimicrobials this admission:  Cefepime 03/14/2022  >>  vancomycin 03/14/2022  >>   Microbiology results: 03/14/2022  BCx: sent 03/14/2022  UCx: sent 03/14/2022  Resp Panel: sent  03/14/2022  MRSA PCR: sent  Thank you for allowing pharmacy to be a part of this patient's care.  Donna Christen Coffee, PharmD Clinical Pharmacist  Addendum: Received d/c vancomycin consult in the in basket, while reviewing meds, noticed pt's scr is up to 12.49 from 0.99 on 9/24. MD note did mentioned pt had poor urine output in the past few days.  Est. Crcl < 10 ml/min.   - Adjust cefepime dose to 1g Q24 hrs base on current renal function.  - f/u scr an adjust as needed.   Thanks!  Maryanna Shape, PharmD, BCPS, BCPPS Clinical Pharmacist  Pager: [phone]

## 2022-03-14 NOTE — ED Provider Notes (Signed)
[facility] EMERGENCY DEPARTMENT Provider Note   CSN: [id number] Arrival date & time: 03/14/22  1219     History  Chief Complaint  Patient presents with   Weakness    Geoffrey Patterson is a 71 y.o. male.  With PMH of HTN, gout, A fib on cardizem and recent left shoulder arthroplasty who presents with generalized fatigue and weakness with decreased appetite and shortness of breath.  Patient was recently released from the hospital this past Monday after his shoulder arthroplasty.  His nephew was in the room who said he typically is independent and lives by himself and is ambulatory with no assistance and able to take care of himself.  However since Tuesday he has had a significant decline.  He has been too weak to get in and out of bed by himself.  He has had decreased appetite and decreased fluid intake.  He has also been having worsening shortness of breath with movement.  No orthopnea or PND.  He has had no fevers at home or cough but does endorse a sore throat.  He has minimal leg swelling in bilateral lower extremities that he says is chronic.  He denies any chest pain but does note some mild constipation.  He was getting constipated on his opioid pain medication so he discontinued it about 2 days ago and start taking MiraLAX with BM yesterday. He is not on any anticoagulation.   Weakness      Home Medications Prior to Admission medications   Medication Sig Start Date End Date Taking? Authorizing Provider  allopurinol (ZYLOPRIM) 300 MG tablet Take 300 mg by mouth daily.    [provider]  diltiazem (CARDIZEM CD) 120 MG 24 hr capsule Take 120 mg by mouth daily.    [provider]  fexofenadine (ALLEGRA) 180 MG tablet Take 180 mg by mouth daily.    [provider]  ondansetron (ZOFRAN) 4 MG tablet Take 1 tablet (4 mg total) by mouth every 8 (eight) hours as needed for nausea or vomiting. 03/08/22   Merlene Pulling K, PA-C  rosuvastatin (CRESTOR) 5 MG tablet  Take 5 mg by mouth daily.    [provider]  sennosides-docusate sodium (SENOKOT-S) 8.6-50 MG tablet Take 2 tablets by mouth daily. 03/08/22   Merlene Pulling K, PA-C  telmisartan (MICARDIS) 80 MG tablet Take 80 mg by mouth daily.    [provider]  traMADol (ULTRAM) 50 MG tablet Take 1 tablet (50 mg total) by mouth every 4 (four) hours. 03/08/22 03/08/23  Ventura Bruns, PA-C  valACYclovir (VALTREX) 1000 MG tablet Take 2,000 mg by mouth 2 (two) times daily as needed (fever blisters).    [provider]      Allergies    Patient has no known allergies.    Review of Systems   Review of Systems  Neurological:  Positive for weakness.    Physical Exam Updated Vital Signs BP (!) 178/71   Pulse 70   Temp 97.6 F (36.4 C) (Oral)   Resp (!) 21   Ht 6' (1.829 m)   Wt 108.9 kg   SpO2 96%   BMI 32.55 kg/m  Physical Exam Constitutional: Alert and oriented.  Fatigued but nontoxic and mentating appropriately Eyes: Conjunctivae are normal. ENT      Head: Normocephalic and atraumatic.      Nose: No congestion.      Mouth/Throat: Mucous membranes are dry.      Neck: No stridor. Cardiovascular: S1, S2, irregular  rhythm with regular rate and equal palpable radial and DP pulses Respiratory: Mild tachypnea, crackles of the bases, O2 sat 94 on RA Gastrointestinal: Soft and mild suprapubic ttp Musculoskeletal: LUE shoulder in sling. Sensation and pulse and grip strength intact distally LUE. Trace equal nontender nonerythematous pitting edema of the bilateral shins Neurologic: Stuttered speech baseline per patient.  PERRL.  EOMI.  Face equal with no droop.  Tongue midline.  Equal grip strength bilateral upper extremities.  Equal strength of the lower extremities.  Sensation grossly intact.  No gross focal neurologic deficits are appreciated. Skin: Skin is warm, dry  Psychiatric: Mood and affect are normal. Speech and behavior are normal.  ED Results / Procedures /  Treatments   Labs (all labs ordered are listed, but only abnormal results are displayed) Labs Reviewed  URINALYSIS, ROUTINE W REFLEX MICROSCOPIC - Abnormal; Notable for the following components:      Result Value   Color, Urine AMBER (*)    APPearance HAZY (*)    Hgb urine dipstick LARGE (*)    Protein, ur 100 (*)    Leukocytes,Ua LARGE (*)    RBC / HPF >50 (*)    WBC, UA >50 (*)    All other components within normal limits  COMPREHENSIVE METABOLIC PANEL - Abnormal; Notable for the following components:   Sodium 131 (*)    Potassium 5.9 (*)    CO2 13 (*)    Glucose, Bld 116 (*)    BUN 160 (*)    Creatinine, Ser 12.49 (*)    Calcium 8.0 (*)    Total Protein 5.9 (*)    Albumin 2.7 (*)    AST 42 (*)    Alkaline Phosphatase 154 (*)    GFR, Estimated 4 (*)    Anion gap 19 (*)    All other components within normal limits  CBC WITH DIFFERENTIAL/PLATELET - Abnormal; Notable for the following components:   WBC 14.1 (*)    RBC 3.67 (*)    Hemoglobin 12.2 (*)    HCT 34.2 (*)    Neutro Abs 12.1 (*)    Lymphs Abs 0.6 (*)    Abs Immature Granulocytes 0.08 (*)    All other components within normal limits  TROPONIN I (HIGH SENSITIVITY) - Abnormal; Notable for the following components:   Troponin I (High Sensitivity) 84 (*)    All other components within normal limits  TROPONIN I (HIGH SENSITIVITY) - Abnormal; Notable for the following components:   Troponin I (High Sensitivity) 63 (*)    All other components within normal limits  RESP PANEL BY RT-PCR (FLU A&B, COVID) ARPGX2  CULTURE, BLOOD (ROUTINE X 2)  CULTURE, BLOOD (ROUTINE X 2)  MRSA NEXT GEN BY PCR, NASAL  BRAIN NATRIURETIC PEPTIDE  LACTIC ACID, PLASMA  LACTIC ACID, PLASMA  CK  PROCALCITONIN    EKG EKG Interpretation  Date/Time:  Sunday March 14 2022 15:05:12 EDT Ventricular Rate:  70 PR Interval:    QRS Duration: 94 QT Interval:  420 QTC Calculation: 454 R Axis:   67 Text Interpretation: Atrial fibrillation  Anteroseptal infarct, age indeterminate Confirmed by Georgina Snell [phone]) on 03/14/2022 3:08:30 PM  Radiology DG Chest Port 1 View  Result Date: 03/14/2022 CLINICAL DATA:  Fatigue. EXAM: PORTABLE CHEST 1 VIEW COMPARISON:  None Available. FINDINGS: Mildly enlarged cardiac silhouette. Patchy retrocardiac airspace opacity in the left lower lobe with probable small left pleural effusion. Right lung is clear. No pneumothorax. Left total shoulder arthroplasty. No acute osseous  abnormality. IMPRESSION: Patchy retrocardiac airspace opacity in the left lower lobe with probable small left pleural effusion, which could reflect atelectasis versus pneumonia in the appropriate clinical context. Electronically Signed   By: Ileana Roup M.D.   On: 03/14/2022 14:22    Procedures .Critical Care  Performed by: Elgie Congo, MD Authorized by: Elgie Congo, MD   Critical care provider statement:    Critical care time (minutes):  45   Critical care was necessary to treat or prevent imminent or life-threatening deterioration of the following conditions:  Renal failure and cardiac failure   Critical care was time spent personally by me on the following activities:  Development of treatment plan with patient or surrogate, discussions with consultants, evaluation of patient's response to treatment, examination of patient, ordering and review of laboratory studies, ordering and review of radiographic studies, ordering and performing treatments and interventions, pulse oximetry, re-evaluation of patient's condition, review of old charts and obtaining history from patient or surrogate   Care discussed with: admitting provider     Remain on constant cardiac monitoring atrial fibrillation with regular rate.  Medications Ordered in ED Medications  ceFEPIme (MAXIPIME) 2 g in sodium chloride 0.9 % 100 mL IVPB (has no administration in time range)  lactated ringers infusion ( Intravenous New Bag/Given 03/14/22  1654)  sodium bicarbonate 1 mEq/mL injection (  Not Given 03/14/22 1659)  sodium chloride 0.9 % bolus 500 mL (has no administration in time range)  ceFEPIme (MAXIPIME) 2 g in sodium chloride 0.9 % 100 mL IVPB (0 g Intravenous Stopped 03/14/22 1628)  lactated ringers bolus 1,000 mL (0 mLs Intravenous Stopped 03/14/22 1628)  vancomycin (VANCOREADY) IVPB 2000 mg/400 mL (0 mg Intravenous Stopped 03/14/22 1845)  sodium bicarbonate injection 50 mEq (50 mEq Intravenous Given 03/14/22 1655)    ED Course/ Medical Decision Making/ A&P Clinical Course as of 03/14/22 1946  Sun Mar 14, 2022  1533 Discussed case with on-call nephrologist.  Trialing 1 L IV fluids and he recommends amp of bicarb due to decreased bicarb and starting LR infusion at 100.  I have also ordered CK on to further evaluate for rhabdo as well as renal ultrasound and ordered for Foley catheter placement.  He said if necessary surgery can place dialysis catheter at [facility] if necessary but we will continue to watch at this time.  Will page hospitalist for admission. [VB]    Clinical Course User Index [VB] Elgie Congo, MD                           Medical Decision Making  MONROE QIN is a 71 y.o. male.  With PMH of HTN, gout, A fib on cardizem and recent left shoulder arthroplasty who presents with generalized fatigue and weakness with decreased appetite and shortness of breath.  Patient presented with generalized fatigue and weakness as opposed to localizing weakness with a nonfocal neurologic exam.  Based off his long period of symptoms and nonlocalizing neurologic exam, no concern for CVA or ICH.  However his work-up was significantly remarkable for AKI creatinine 12.5 with BUN to creatinine ratio of approximately 13.  This likely multifactorial but consider rhabdomyolysis from prolonged bedbound status after recent surgery, decreased p.o. intake, NSAID use and possible underlying CHF.  His last echo was many years prior.   Chest x-ray showed no pulmonary edema but evidence of a left lower lobe infiltrate concerning for pneumonia.  With his increased  shortness of breath and URI type symptoms, covering with broad-spectrum antibiotics due to recent hospitalization leukocytosis 14.1.  He has elevated troponin 84 which is likely demand ischemia in the setting of renal dysfunction and infection.  He denies any chest pain and there is no acute ischemic changes on his EKG.  Lactate was 1.1, unlikely septic shock.  I discussed case with on-call nephrologist who recommends LR bolus followed by LR infusion at 100 and giving amp of bicarb due to low bicarbonate level 13.  His K is 5.9 but no EKG changes.  We have also ordered for renal ultrasound and indwelling Foley catheter.  If no improvement, will likely require dialysis, he said surgery could arrange for placement of dialysis catheter inpatient if necessary.  They will see patient inpatient tomorrow.  We will discuss case with hospitalist for further work-up and admission.  Amount and/or Complexity of Data Reviewed Labs: ordered. Radiology: ordered.  Risk Prescription drug management. Decision regarding hospitalization.   Final Clinical Impression(s) / ED Diagnoses Final diagnoses:  Weakness  Pneumonia of right lower lobe due to infectious organism  AKI (acute kidney injury) ([place])  Elevated troponin    Rx / DC Orders ED Discharge Orders     None         Elgie Congo, MD 03/14/22 1946

## 2022-03-14 NOTE — Sepsis Progress Note (Signed)
Elink following code sepsis °

## 2022-03-14 NOTE — Assessment & Plan Note (Addendum)
AKI, creatinine significantly elevated at 12.49, just 7 days ago- his creatinine was 0.99.  Potassium 5.9.  Serum bicarb of 13.  BUN of 160.  Foley catheter inserted, draining 2.9 L of urine within the first hour.  Etiology likely postobstructive -likely has BPH that was worsened by anesthesia.  Poor oral intake likely from azotemia and may have contributed, AKI,  He is also on ARB's. - EDP talked with nephrology, okay stay here, general surgery can place catheter in a.m. if needed, trial of IV fluids, give 1 amp bicarb -1.5 L bolus given, continue N/s 125cc/hr X 20hrs -Monitor for postobstructive diuresis -BMP in the morning -Follow-up renal ultrasound -Hold telmisartan -Continue Foley

## 2022-03-14 NOTE — Assessment & Plan Note (Addendum)
With significant AKI. Possibly underlying BPH, asked by anesthesia. -Foley -Start tamsulosin 0.4 mg daily -Renal ultrasound

## 2022-03-14 NOTE — Assessment & Plan Note (Addendum)
Dyspnea on exertion.  No cough.  No fever.  No leukocytosis.  On room air.  Dyspnea likely secondary to ongoing severe AKI and poor oral intake.  Chest x-ray atelectasis or pneumonia. No chest pain.  Troponin flat 84 > 63. -At this time hold off on further antibiotics for now -Check procalcitonin -Addendum procalcitonin elevated at 0.36, will continue with ceftriaxone and azithromycin.

## 2022-03-14 NOTE — ED Triage Notes (Signed)
Pt brought in by [facility] for c/o weakness; pt had left shoulder surgery x one week ago and today was unable to get up; pt has bilateral swelling to feet  Cbg 177

## 2022-03-14 NOTE — Assessment & Plan Note (Signed)
Blood pressure systolic 573U to 202R. -Hold telmisartan -Resume Cardizem - PRN hydralazine for systolic greater than 427

## 2022-03-14 NOTE — H&P (Addendum)
History and Physical    Geoffrey Patterson HGD:[id number] DOB: 12-16-1950 DOA: 03/14/2022  PCP: Celene Squibb, MD   Patient coming from: Home  I have personally briefly reviewed patient's old medical records in [place]  Chief Complaint: Weakness  HPI: Geoffrey Patterson is a 71 y.o. male with medical history significant for gout, hypertension, question atrial fibrillation. Patient was brought to the ED reports of weakness, unable to get up, poor appetite.  Patient had left shoulder surgery a week ago,- 9/24 by Dr. Mardelle Matte-  For left shoulder fractures that he sustained when he fell while walking 02/17/2022. Since 2 days after the surgery, he has progressively gotten weak, with poor appetite, and unable to ambulate.  At baseline he ambulates without assistance or assistive devices.  He also reports difficulty breathing with exertion none at rest, no cough.  No chest pain.  Reports poor urine output over the past few days, but he denies any pain with urination, denies straining with urination or any other urinary symptoms or diagnosis before or after the surgery.  No fevers no chills. His left shoulder has been in a sling since the surgery, he denies increasing pain, no discharge or discoloration from the left shoulder. He has been taking only Tylenol and tramadol for pain since the surgery.  No NSAID use since surgery. Patient's nephew [name] is at bedside.  ED Course: Respirate rate 16 -26.  Afebrile temperature 97.6.  Heart rate 60s to 70s.  O2 sats 94 to 96% on room air. Creatinine markedly elevated at 12.49. EKG shows atrial fibrillation. Leukocytosis of 14.1. Chest x-ray shows-patchy retrocardiac airspace opacity in the left lower lobe atelectasis or pneumonia. Foley catheter inserted with drainage of about 2.9 L of pink-tinged urine. IV vancomycin and cefepime started in ED EDP talked to nephrology, recommend admission [facility] at [facility].  Review of Systems: As per HPI all  other systems reviewed and negative.  Past Medical History:  Diagnosis Date   Dysrhythmia    a-fib history   Gout    Hypertension     Past Surgical History:  Procedure Laterality Date   COLONOSCOPY     COLONOSCOPY N/A 12/30/2016   Procedure: COLONOSCOPY;  Surgeon: Rogene Houston, MD;  Location: [facility];  Service: Endoscopy;  Laterality: N/A;  1030   POLYPECTOMY  12/30/2016   Procedure: POLYPECTOMY;  Surgeon: Rogene Houston, MD;  Location: [facility];  Service: Endoscopy;;  colon    REVERSE SHOULDER ARTHROPLASTY Left 03/07/2022   Procedure: REVERSE SHOULDER ARTHROPLASTY;  Surgeon: Marchia Bond, MD;  Location: [place];  Service: Orthopedics;  Laterality: Left;   TONSILLECTOMY       reports that he has quit smoking. His smoking use included cigarettes. He has never used smokeless tobacco. He reports that he does not drink alcohol and does not use drugs.  No Known Allergies  Family History  Problem Relation Age of Onset   Hypertension Mother    Hypertension Father    Diabetic kidney disease Father    Prior to Admission medications   Medication Sig Start Date End Date Taking? Authorizing Provider  allopurinol (ZYLOPRIM) 300 MG tablet Take 300 mg by mouth daily.    [provider]  diltiazem (CARDIZEM CD) 120 MG 24 hr capsule Take 120 mg by mouth daily.    [provider]  fexofenadine (ALLEGRA) 180 MG tablet Take 180 mg by mouth daily.    [provider]  ondansetron [facility]) 4  MG tablet Take 1 tablet (4 mg total) by mouth every 8 (eight) hours as needed for nausea or vomiting. 03/08/22   Merlene Pulling K, PA-C  rosuvastatin (CRESTOR) 5 MG tablet Take 5 mg by mouth daily.    [provider]  sennosides-docusate sodium (SENOKOT-S) 8.6-50 MG tablet Take 2 tablets by mouth daily. 03/08/22   Merlene Pulling K, PA-C  telmisartan (MICARDIS) 80 MG tablet Take 80 mg by mouth daily.    [provider]  traMADol (ULTRAM) 50 MG tablet Take  1 tablet (50 mg total) by mouth every 4 (four) hours. 03/08/22 03/08/23  Ventura Bruns, PA-C  valACYclovir (VALTREX) 1000 MG tablet Take 2,000 mg by mouth 2 (two) times daily as needed (fever blisters).    [provider]    Physical Exam: Vitals:   03/14/22 1500 03/14/22 1530 03/14/22 1600 03/14/22 1643  BP: (!) 161/73 (!) 156/84 (!) 178/71   Pulse: 76 71 70   Resp: (!) 23 18 (!) 21   Temp:    97.6 F (36.4 C)  TempSrc:    Oral  SpO2: 94% 95% 96%   Weight:      Height:        Constitutional: NAD, calm, comfortable Vitals:   03/14/22 1500 03/14/22 1530 03/14/22 1600 03/14/22 1643  BP: (!) 161/73 (!) 156/84 (!) 178/71   Pulse: 76 71 70   Resp: (!) 23 18 (!) 21   Temp:    97.6 F (36.4 C)  TempSrc:    Oral  SpO2: 94% 95% 96%   Weight:      Height:       Eyes: PERRL, lids and conjunctivae normal ENMT: Mucous membranes are moist.   Neck: normal, supple, no masses, no thyromegaly Respiratory: clear to auscultation bilaterally, no wheezing, no crackles.  Cardiovascular: Regular rate and rhythm, no murmurs / rubs / gallops.  Abdomen: no tenderness, no masses palpated. No hepatosplenomegaly. Bowel sounds positive.  Right upper extremity tested- no asterixis. Musculoskeletal: Left upper extremity in a sling, no clubbing / cyanosis.  Skin: Clean dressing to left shoulder area from recent surgery, wound appears well opposed, no surrounding erythema or drainage, no rashes, lesions, ulcers. Neurologic: 4+/5 in bilateral lower extremities, he stammers but this is his baseline speech, Psychiatric: Normal judgment and insight. Alert and oriented x 3. Normal mood.   Labs on Admission: I have personally reviewed following labs and imaging studies  CBC: Recent Labs  Lab 03/14/22 1330  WBC 14.1*  NEUTROABS 12.1*  HGB 12.2*  HCT 34.2*  MCV 93.2  PLT 154   Basic Metabolic Panel: Recent Labs  Lab 03/14/22 1330  NA 131*  K 5.9*  CL 99  CO2 13*  GLUCOSE 116*  BUN 160*   CREATININE 12.49*  CALCIUM 8.0*   GFR: Estimated Creatinine Clearance: 6.7 mL/min (A) (by C-G formula based on SCr of 12.49 mg/dL (H)). Liver Function Tests: Recent Labs  Lab 03/14/22 1330  AST 42*  ALT 20  ALKPHOS 154*  BILITOT 1.0  PROT 5.9*  ALBUMIN 2.7*   Cardiac Enzymes: Recent Labs  Lab 03/14/22 1801  CKTOTAL 49   Urine analysis:    Component Value Date/Time   COLORURINE AMBER (A) 03/14/2022 1309   APPEARANCEUR HAZY (A) 03/14/2022 1309   LABSPEC 1.010 03/14/2022 1309   PHURINE 6.0 03/14/2022 1309   GLUCOSEU NEGATIVE 03/14/2022 1309   HGBUR LARGE (A) 03/14/2022 1309   BILIRUBINUR NEGATIVE 03/14/2022 1309   KETONESUR NEGATIVE 03/14/2022 1309  PROTEINUR 100 (A) 03/14/2022 1309   NITRITE NEGATIVE 03/14/2022 1309   LEUKOCYTESUR LARGE (A) 03/14/2022 1309    Radiological Exams on Admission: DG Chest Port 1 View  Result Date: 03/14/2022 CLINICAL DATA:  Fatigue. EXAM: PORTABLE CHEST 1 VIEW COMPARISON:  None Available. FINDINGS: Mildly enlarged cardiac silhouette. Patchy retrocardiac airspace opacity in the left lower lobe with probable small left pleural effusion. Right lung is clear. No pneumothorax. Left total shoulder arthroplasty. No acute osseous abnormality. IMPRESSION: Patchy retrocardiac airspace opacity in the left lower lobe with probable small left pleural effusion, which could reflect atelectasis versus pneumonia in the appropriate clinical context. Electronically Signed   By: Ileana Roup M.D.   On: 03/14/2022 14:22    EKG: Independently reviewed.  Atrial fibrillation rate 70, QTc 454.  Assessment/Plan Principal Problem:   AKI (acute kidney injury) ([place]) Active Problems:   Acute urinary retention   Essential hypertension   Atrial fibrillation (HCC)   Dyspnea   Assessment and Plan: * AKI (acute kidney injury) ([place]) AKI, creatinine significantly elevated at 12.49, just 7 days ago- his creatinine was 0.99.  Potassium 5.9.  Serum bicarb of 13.  BUN of  160.  Foley catheter inserted, draining 2.9 L of urine within the first hour.  Etiology likely postobstructive -likely has BPH that was worsened by anesthesia.  Poor oral intake likely from azotemia and may have contributed, AKI,  He is also on ARB's. - EDP talked with nephrology, okay stay here, general surgery can place catheter in a.m. if needed, trial of IV fluids, give 1 amp bicarb -1.5 L bolus given, continue N/s 125cc/hr X 20hrs -Monitor for postobstructive diuresis -BMP in the morning -Follow-up renal ultrasound -Hold telmisartan -Continue Foley  Acute urinary retention With significant AKI. Possibly underlying BPH, asked by anesthesia. -Foley -Start tamsulosin 0.4 mg daily -Renal ultrasound  Dyspnea Dyspnea on exertion.  No cough.  No fever.  No leukocytosis.  On room air.  Dyspnea likely secondary to ongoing severe AKI and poor oral intake.  Chest x-ray atelectasis or pneumonia. No chest pain.  Troponin flat 84 > 63. -At this time hold off on further antibiotics for now -Check procalcitonin -Addendum procalcitonin elevated at 0.36, will continue with ceftriaxone and azithromycin.  Atrial fibrillation (HCC) Rate controlled, with diltiazem.  Currently not on anticoagulation.  Previously on anticoagulation with with Eliquis. -Resume diltiazem -Will need to find out why patient is no longer on anticoagulation  Essential hypertension Blood pressure systolic 397Q to 734L. -Hold telmisartan -Resume Cardizem - PRN hydralazine for systolic greater than 937   DVT prophylaxis: Heparin Code Status: Full Family Communication: Marina Gravel is at bedside Disposition Plan: ~ 2 days Consults called: Nephrology Admission status:  Inpt tele I certify that at the point of admission it is my clinical judgment that the patient will require inpatient hospital care spanning beyond 2 midnights from the point of admission due to high intensity of service, high risk for further deterioration  and high frequency of surveillance required.   Author: Bethena Roys, MD 03/14/2022 7:50 PM  For on call review [email or web address].

## 2022-03-15 ENCOUNTER — Inpatient Hospital Stay (HOSPITAL_COMMUNITY): Payer: Medicare HMO

## 2022-03-15 DIAGNOSIS — N179 Acute kidney failure, unspecified: Secondary | ICD-10-CM | POA: Diagnosis not present

## 2022-03-15 LAB — CBC
HCT: 30.7 % — ABNORMAL LOW (ref 39.0–52.0)
Hemoglobin: 10.5 g/dL — ABNORMAL LOW (ref 13.0–17.0)
MCH: 31.9 pg (ref 26.0–34.0)
MCHC: 34.2 g/dL (ref 30.0–36.0)
MCV: 93.3 fL (ref 80.0–100.0)
Platelets: 137 10*3/uL — ABNORMAL LOW (ref 150–400)
RBC: 3.29 MIL/uL — ABNORMAL LOW (ref 4.22–5.81)
RDW: 12.3 % (ref 11.5–15.5)
WBC: 8.6 10*3/uL (ref 4.0–10.5)
nRBC: 0 % (ref 0.0–0.2)

## 2022-03-15 LAB — BASIC METABOLIC PANEL
Anion gap: 6 (ref 5–15)
BUN: 75 mg/dL — ABNORMAL HIGH (ref 8–23)
CO2: 15 mmol/L — ABNORMAL LOW (ref 22–32)
Calcium: 7.4 mg/dL — ABNORMAL LOW (ref 8.9–10.3)
Chloride: 121 mmol/L — ABNORMAL HIGH (ref 98–111)
Creatinine, Ser: 3.37 mg/dL — ABNORMAL HIGH (ref 0.61–1.24)
GFR, Estimated: 18 mL/min — ABNORMAL LOW (ref >60)
Glucose, Bld: 95 mg/dL (ref 70–99)
Potassium: 4.5 mmol/L (ref 3.5–5.1)
Sodium: 142 mmol/L (ref 135–145)

## 2022-03-15 MED ORDER — HYDROCORTISONE (PERIANAL) 2.5 % EX CREA
TOPICAL_CREAM | Freq: Two times a day (BID) | CUTANEOUS | Status: DC
  Administered 2022-03-20: 1 via RECTAL
  Filled 2022-03-15: qty 28.35

## 2022-03-15 MED ORDER — CHLORHEXIDINE GLUCONATE CLOTH 2 % EX PADS
6.0000 | MEDICATED_PAD | Freq: Every day | CUTANEOUS | Status: DC
  Administered 2022-03-15 – 2022-03-22 (×8): 6 via TOPICAL

## 2022-03-15 MED ORDER — LACTATED RINGERS IV SOLN: INTRAVENOUS | Status: DC

## 2022-03-15 NOTE — Progress Notes (Signed)
Patient well-known to me, readmitted to [facility] with renal failure and urinary retention.  Okay to continue with his shoulder dressing, sling immobilization, no activity indicated for the shoulder.  Okay for finger wrist and hand motion if desired.  Plan to follow-up with me after he is discharged from the hospital.  Marchia Bond, MD

## 2022-03-15 NOTE — Progress Notes (Signed)
PROGRESS NOTE LORIE MELICHAR  RSW:[id number] DOB: [birth year] DOA: 03/14/2022 PCP: Celene Squibb, MD   Brief Narrative/Hospital Course: 71 year old male with history of gout, hypertension,?  A-fib brought to the ED with weakness unable to get up and poor appetite following his left shoulder surgery on 9/24 for left shoulder fracture which he sustained after he fell while walking on 02/17/2022. In the ED vitals stable significant AKI with creatinine 09.3, metabolic acidosis EKG with A-fib, leukocytosis chest x-ray patchy retrocardiac airspace opacity in the left lower lobe atelectasis or pneumonia, Foley catheter was inserted immediately drained 2.9 L of pink-tinged urine given IV vancomycin and cefepime nephrology was discussed and admitted after giving a dose of bicarb. Patient admitted on IV fluid hydration    Subjective: Seen this am On RA. sister at bedside Aa0x3, no complaints, feels well, foley in bag with clear urine Patient otherwise denies any nausea, vomiting, chest pain, shortness of breath, fever, chills, headache, focal weakness, numbness tingling. C/o hemorrhoids   Assessment and Plan: Principal Problem:   AKI (acute kidney injury) ([place]) Active Problems:   Acute urinary retention   Essential hypertension   Atrial fibrillation (HCC)   Dyspnea  AKI  Metabolic acidosis 2/2 AKI Hyperkalemia 2/2 AKI: AKI most likely combination of postobstructive AKI along with prerenal with dehydration: On admission creatinine 12.4 with IV fluids overnight improved drastically to 3.3, bicarb improving 13> 15, BUN 160> 75.  Gust with Dr. Joelyn Oms this morning who reviewed chart-since creatinine stabilizing hold off on further consult we will request nephrology to see if creatinine does not normalize.  Potassium has improved.  Continue to hold nephrotoxic medication/ARB  Acute urine retention due to AKI with underlying BPH: Started on tamsulosin, Foley inserted and having good outpu- he will need  urology follow-up and TOV as outpatient.  Follow-up renal ultrasound.  Solid mass in the urinary bladder measuring 6.2 X5.5X 2.9 cm, will consult with urology  Pyuria with WBC more than 50 RBC more than 50 LE large.  No significant urinary complaints Leukocytosis:?  Etiology procalcitonin not elevated at 0.3 normal lactic acid question hemoconcentration in the setting of AKI/dehydration, WBC normalized with overnight hydration.  Follow-up culture data, on empiric ceftriaxone/Romycin until cultures negative  Mildly elevated troponin flat suspect in the setting of severe AKI/demand ischemia.  No chest pain  Dyspnea on exertion: Likely atelectasis procalcitonin slightly up placed on empiric antibiotics for possible pneumonia which I doubt we will repeat chest x-ray in the morning.  PT OT eval  A-fib rate controlled on Cardizem not on anticoagulation, does not remember being on [facility]- has hemorrhoids and bleeding issues. Advised op fu with cards and pcp. I discussed with him and his siter. Cont Cardizem as BP tolerates Essential hypertension: BP uptrending, on Cardizem.  Class I Obesity:Patient's Body mass index is 32.55 kg/m. : Will benefit with PCP follow-up, weight loss  healthy lifestyle and outpatient sleep evaluation.  left shoulder surgery on 9/24 for left shoulder fracture which he sustained after he fell while walking on 02/17/2022.  Surgery/dressing placed.  Reports S/P supposed to follow-up with orthopedics today.  Will let Dr. Hartford Poli know  DVT prophylaxis: heparin injection 5,000 Units Start: 03/14/22 2200 Code Status:   Code Status: Full Code Family Communication: plan of care discussed with patient/family at bedside. Patient status is: Inpatient because of severe AKI Level of care: Telemetry   Dispo: The patient is from: home            Anticipated disposition: home  in 2 days  Mobility Assessment (last 72 hours)     Mobility Assessment   No documentation.             Objective: Vitals last 24 hrs: Vitals:   03/14/22 2000 03/14/22 2028 03/14/22 2337 03/15/22 0428  BP: (!) 157/107 132/72 131/80 (!) 165/91  Pulse: 83 83 86 91  Resp: (!) '23 20 20 20  '$ Temp:  97.9 F (36.6 C) 97.7 F (36.5 C) 98.3 F (36.8 C)  TempSrc:  Oral    SpO2: 96% 96% 98% 97%  Weight:      Height:       Weight change:   Physical Examination: General exam: alert awake,older than stated age, weak appearing. HEENT:Oral mucosa moist, Ear/Nose WNL grossly, dentition normal. Respiratory system: bilaterally diminished BS, no use of accessory muscle Cardiovascular system: S1 & S2 +, No JVD. Gastrointestinal system: Abdomen soft,NT,ND, BS+ Nervous System:Alert, awake, moving extremities and grossly nonfocal Extremities: LE edema neg,distal peripheral pulses palpable.  Skin: No rashes,no icterus. MSK: Normal muscle bulk,tone, power  Medications reviewed:  Scheduled Meds:  Chlorhexidine Gluconate Cloth  6 each Topical Q0600   diltiazem  120 mg Oral Daily   heparin  5,000 Units Subcutaneous Q8H   hydrocortisone   Rectal BID   tamsulosin  0.4 mg Oral QPC supper   Continuous Infusions:  azithromycin 500 mg (03/14/22 2220)   cefTRIAXone (ROCEPHIN)  IV     lactated ringers 100 mL/hr at 03/15/22 1123      Diet Order             Diet Heart Room service appropriate? Yes; Fluid consistency: Thin  Diet effective now                            Intake/Output Summary (Last 24 hours) at 03/15/2022 1352 Last data filed at 03/15/2022 0701 Gross per 24 hour  Intake 2220 ml  Output 9800 ml  Net -7580 ml   Net IO Since Admission: -7,580 mL [03/15/22 1352]  Wt Readings from Last 3 Encounters:  03/14/22 108.9 kg  03/07/22 108.9 kg  01/25/17 114.9 kg     Unresulted Labs (From admission, onward)     Start     Ordered   03/16/22 6387  Basic metabolic panel  Daily at 5am,   R     Question:  Specimen collection method  Answer:  Lab=Lab collect   03/15/22 0945    03/16/22 0500  CBC  Daily at 5am,   R     Question:  Specimen collection method  Answer:  Lab=Lab collect   03/15/22 0945          Data Reviewed: I have personally reviewed following labs and imaging studies CBC: Recent Labs  Lab 03/14/22 1330 03/15/22 0714  WBC 14.1* 8.6  NEUTROABS 12.1*  --   HGB 12.2* 10.5*  HCT 34.2* 30.7*  MCV 93.2 93.3  PLT 165 564*   Basic Metabolic Panel: Recent Labs  Lab 03/14/22 1330 03/15/22 0715  NA 131* 142  K 5.9* 4.5  CL 99 121*  CO2 13* 15*  GLUCOSE 116* 95  BUN 160* 75*  CREATININE 12.49* 3.37*  CALCIUM 8.0* 7.4*   GFR: Estimated Creatinine Clearance: 24.9 mL/min (A) (by C-G formula based on SCr of 3.37 mg/dL (H)). Liver Function Tests: Recent Labs  Lab 03/14/22 1330  AST 42*  ALT 20  ALKPHOS 154*  BILITOT 1.0  PROT 5.9*  ALBUMIN 2.7*   No results for input(s): "LIPASE", "AMYLASE" in the last 168 hours. No results for input(s): "AMMONIA" in the last 168 hours. Coagulation Profile: No results for input(s): "INR", "PROTIME" in the last 168 hours. BNP (last 3 results) No results for input(s): "PROBNP" in the last 8760 hours. HbA1C: No results for input(s): "HGBA1C" in the last 72 hours. CBG: No results for input(s): "GLUCAP" in the last 168 hours. Lipid Profile: No results for input(s): "CHOL", "HDL", "LDLCALC", "TRIG", "CHOLHDL", "LDLDIRECT" in the last 72 hours. Thyroid Function Tests: No results for input(s): "TSH", "T4TOTAL", "FREET4", "T3FREE", "THYROIDAB" in the last 72 hours. Sepsis Labs: Recent Labs  Lab 03/14/22 1439 03/14/22 1801  PROCALCITON  --  0.36  LATICACIDVEN 1.1 1.6    Recent Results (from the past 240 hour(s))  Surgical pcr screen     Status: None   Collection Time: 03/07/22  8:25 AM   Specimen: Nasal Mucosa; Nasal Swab  Result Value Ref Range Status   MRSA, PCR NEGATIVE NEGATIVE Final   Staphylococcus aureus NEGATIVE NEGATIVE Final    Comment: (NOTE) The Xpert SA Assay (FDA approved for  NASAL specimens in patients 56 years of age and older), is one component of a comprehensive surveillance program. It is not intended to diagnose infection nor to guide or monitor treatment. Performed at [facility], [place] [address], [place], [place] [address]   Resp Panel by RT-PCR (Flu A&B, Covid) Anterior Nasal Swab     Status: None   Collection Time: 03/14/22  1:39 PM   Specimen: Anterior Nasal Swab  Result Value Ref Range Status   SARS Coronavirus 2 by RT PCR NEGATIVE NEGATIVE Final    Comment: (NOTE) SARS-CoV-2 target nucleic acids are NOT DETECTED.  The SARS-CoV-2 RNA is generally detectable in upper respiratory specimens during the acute phase of infection. The lowest concentration of SARS-CoV-2 viral copies this assay can detect is 138 copies/mL. A negative result does not preclude SARS-Cov-2 infection and should not be used as the sole basis for treatment or other patient management decisions. A negative result may occur with  improper specimen collection/handling, submission of specimen other than nasopharyngeal swab, presence of viral mutation(s) within the areas targeted by this assay, and inadequate number of viral copies(<138 copies/mL). A negative result must be combined with clinical observations, patient history, and epidemiological information. The expected result is Negative.  Fact Sheet for Patients:  EntrepreneurPulse.com.au  Fact Sheet for Healthcare Providers:  IncredibleEmployment.be  This test is no t yet approved or cleared by the [place] FDA and  has been authorized for detection and/or diagnosis of SARS-CoV-2 by FDA under an Emergency Use Authorization (EUA). This EUA will remain  in effect (meaning this test can be used) for the duration of the COVID-19 declaration under Section 564(b)(1) of the Act, 21 U.S.C.section 360bbb-3(b)(1), unless the authorization is terminated  or revoked sooner.        Influenza A by PCR NEGATIVE NEGATIVE Final   Influenza B by PCR NEGATIVE NEGATIVE Final    Comment: (NOTE) The Xpert Xpress SARS-CoV-2/FLU/RSV plus assay is intended as an aid in the diagnosis of influenza from Nasopharyngeal swab specimens and should not be used as a sole basis for treatment. Nasal washings and aspirates are unacceptable for Xpert Xpress SARS-CoV-2/FLU/RSV testing.  Fact Sheet for Patients: EntrepreneurPulse.com.au  Fact Sheet for Healthcare Providers: IncredibleEmployment.be  This test is not yet approved or cleared by the [place] FDA and has been authorized for detection  and/or diagnosis of SARS-CoV-2 by FDA under an Emergency Use Authorization (EUA). This EUA will remain in effect (meaning this test can be used) for the duration of the COVID-19 declaration under Section 564(b)(1) of the Act, 21 U.S.C. section 360bbb-3(b)(1), unless the authorization is terminated or revoked.  Performed at [facility], [address] [place], [place] [address]   Blood culture (routine x 2)     Status: None (Preliminary result)   Collection Time: 03/14/22  2:39 PM   Specimen: BLOOD  Result Value Ref Range Status   Specimen Description BLOOD RIGHT ANTECUBITAL  Final   Special Requests   Final    BOTTLES DRAWN AEROBIC AND ANAEROBIC Blood Culture adequate volume   Culture   Final    NO GROWTH < 12 HOURS Performed at [facility], [address] [place], [place] [address]    Report Status PENDING  Incomplete  Blood culture (routine x 2)     Status: None (Preliminary result)   Collection Time: 03/14/22  2:39 PM   Specimen: BLOOD  Result Value Ref Range Status   Specimen Description BLOOD BLOOD RIGHT HAND  Final   Special Requests   Final    BOTTLES DRAWN AEROBIC AND ANAEROBIC Blood Culture adequate volume   Culture   Final    NO GROWTH < 12 HOURS Performed at [facility], [address] [place], [place] [address]    Report Status  PENDING  Incomplete  MRSA Next Gen by PCR, Nasal     Status: None   Collection Time: 03/14/22  4:07 PM   Specimen: Nasal Mucosa; Nasal Swab  Result Value Ref Range Status   MRSA by PCR Next Gen NOT DETECTED NOT DETECTED Final    Comment: (NOTE) The GeneXpert MRSA Assay (FDA approved for NASAL specimens only), is one component of a comprehensive MRSA colonization surveillance program. It is not intended to diagnose MRSA infection nor to guide or monitor treatment for MRSA infections. Test performance is not FDA approved in patients less than 83 years old. Performed at [facility], [address], [place], [place] [address]     Antimicrobials: Anti-infectives (From admission, onward)    Start     Dose/Rate Route Frequency Ordered Stop   03/15/22 1500  cefTRIAXone (ROCEPHIN) 2 g in sodium chloride 0.9 % 100 mL IVPB        2 g 200 mL/hr over 30 Minutes Intravenous Every 24 hours 03/14/22 2122     03/15/22 0300  vancomycin (VANCOREADY) IVPB 750 mg/150 mL  Status:  Discontinued        750 mg 150 mL/hr over 60 Minutes Intravenous Every 12 hours 03/14/22 1455 03/14/22 1933   03/15/22 0000  ceFEPIme (MAXIPIME) 1 g in sodium chloride 0.9 % 100 mL IVPB  Status:  Discontinued        1 g 200 mL/hr over 30 Minutes Intravenous Every 24 hours 03/14/22 1957 03/14/22 2122   03/14/22 2215  azithromycin (ZITHROMAX) 500 mg in sodium chloride 0.9 % 250 mL IVPB        500 mg 250 mL/hr over 60 Minutes Intravenous Every 24 hours 03/14/22 2122     03/14/22 2200  ceFEPIme (MAXIPIME) 2 g in sodium chloride 0.9 % 100 mL IVPB  Status:  Discontinued        2 g 200 mL/hr over 30 Minutes Intravenous Every 8 hours 03/14/22 1455 03/14/22 1957   03/14/22 1500  vancomycin (VANCOCIN) IVPB 1000 mg/200 mL premix  Status:  Discontinued  1,000 mg 200 mL/hr over 60 Minutes Intravenous  Once 03/14/22 1449 03/14/22 1454   03/14/22 1500  ceFEPIme (MAXIPIME) 2 g in sodium chloride 0.9 % 100 mL IVPB        2 g 200  mL/hr over 30 Minutes Intravenous  Once 03/14/22 1449 03/14/22 1628   03/14/22 1500  vancomycin (VANCOREADY) IVPB 2000 mg/400 mL        2,000 mg 200 mL/hr over 120 Minutes Intravenous  Once 03/14/22 1454 03/14/22 1845      Culture/Microbiology    Component Value Date/Time   SDES BLOOD RIGHT ANTECUBITAL 03/14/2022 1439   SDES BLOOD BLOOD RIGHT HAND 03/14/2022 1439   SPECREQUEST  03/14/2022 1439    BOTTLES DRAWN AEROBIC AND ANAEROBIC Blood Culture adequate volume   SPECREQUEST  03/14/2022 1439    BOTTLES DRAWN AEROBIC AND ANAEROBIC Blood Culture adequate volume   CULT  03/14/2022 1439    NO GROWTH < 12 HOURS Performed at [facility], [address], [place], [place] [address]    [place]  03/14/2022 1439    NO GROWTH < 12 HOURS Performed at [facility], [address], [place], [place] [address]    REPTSTATUS PENDING 03/14/2022 1439   REPTSTATUS PENDING 03/14/2022 1439  Radiology Studies: US Renal  Result Date: 03/15/2022 CLINICAL DATA:  Acute kidney injury. EXAM: RENAL / URINARY TRACT ULTRASOUND COMPLETE COMPARISON:  None Available. FINDINGS: Right Kidney: Renal measurements: 12.6 x 7.6 x 6.1 cm = volume: 305 mL. Echogenicity within normal limits. No mass or hydronephrosis visualized. Left Kidney: Renal measurements: 13.1 x 7.9 x 6.6 cm = volume: 358 mL. Echogenicity within normal limits. No mass or hydronephrosis visualized. Bladder: There appears to be a solid mass in the urinary bladder measuring 6.2 x 5.5 x 2.9 cm. Other: None. IMPRESSION: Kidneys are unremarkable. However, there appears to be a solid mass in the urinary bladder measuring 6.2 x 5.5 x 2.9 cm. Further evaluation with CT scan or cystoscopy is recommended. These results will be called to the ordering clinician or representative by the Radiologist Assistant, and communication documented in the PACS or zVision Dashboard. Electronically Signed   By: Marijo Conception M.D.   On: 03/15/2022 13:17   DG Chest Port 1 View  Result  Date: 03/14/2022 CLINICAL DATA:  Fatigue. EXAM: PORTABLE CHEST 1 VIEW COMPARISON:  None Available. FINDINGS: Mildly enlarged cardiac silhouette. Patchy retrocardiac airspace opacity in the left lower lobe with probable small left pleural effusion. Right lung is clear. No pneumothorax. Left total shoulder arthroplasty. No acute osseous abnormality. IMPRESSION: Patchy retrocardiac airspace opacity in the left lower lobe with probable small left pleural effusion, which could reflect atelectasis versus pneumonia in the appropriate clinical context. Electronically Signed   By: Ileana Roup M.D.   On: 03/14/2022 14:22     LOS: 1 day   Antonieta Pert, MD [facility]  03/15/2022, 1:52 PM

## 2022-03-15 NOTE — Hospital Course (Addendum)
71 year old male with history of gout, hypertension,?  A-fib brought to the ED with weakness unable to get up and poor appetite following his left shoulder surgery on 9/24 for left shoulder fracture which he sustained after he fell while walking on 02/17/2022. In the ED vitals stable significant AKI with creatinine 93.5, metabolic acidosis EKG with A-fib, leukocytosis chest x-ray patchy retrocardiac airspace opacity in the left lower lobe atelectasis or pneumonia, Foley catheter was inserted immediately drained 2.9 L of pink-tinged urine given IV vancomycin and cefepime nephrology was discussed and admitted after giving a dose of bicarb. Patient admitted on IV fluid hydration

## 2022-03-15 NOTE — TOC Progression Note (Signed)
  Transition of Care [facility]) Screening Note   Patient Details  Name: Geoffrey Patterson Date of Birth: 10/11/1950   Transition of Care [facility]) CM/SW Contact:    Boneta Lucks, RN Phone Number: 03/15/2022, 12:00 PM  PT/OT eval pending- DC plan 1-2 days  Transition of Care Department [facility]) has reviewed patient and no TOC needs have been identified at this time. We will continue to monitor patient advancement through interdisciplinary progression rounds. If new patient transition needs arise, please place a TOC consult.       Barriers to Discharge: Continued Medical Work up

## 2022-03-16 ENCOUNTER — Inpatient Hospital Stay (HOSPITAL_COMMUNITY): Payer: Medicare HMO

## 2022-03-16 DIAGNOSIS — N401 Enlarged prostate with lower urinary tract symptoms: Secondary | ICD-10-CM | POA: Diagnosis not present

## 2022-03-16 DIAGNOSIS — N3289 Other specified disorders of bladder: Secondary | ICD-10-CM | POA: Diagnosis not present

## 2022-03-16 DIAGNOSIS — R338 Other retention of urine: Secondary | ICD-10-CM | POA: Diagnosis not present

## 2022-03-16 DIAGNOSIS — N179 Acute kidney failure, unspecified: Secondary | ICD-10-CM | POA: Diagnosis not present

## 2022-03-16 LAB — CBC
HCT: 31.7 % — ABNORMAL LOW (ref 39.0–52.0)
Hemoglobin: 10.6 g/dL — ABNORMAL LOW (ref 13.0–17.0)
MCH: 32.6 pg (ref 26.0–34.0)
MCHC: 33.4 g/dL (ref 30.0–36.0)
MCV: 97.5 fL (ref 80.0–100.0)
Platelets: 163 10*3/uL (ref 150–400)
RBC: 3.25 MIL/uL — ABNORMAL LOW (ref 4.22–5.81)
RDW: 12.4 % (ref 11.5–15.5)
WBC: 8.4 10*3/uL (ref 4.0–10.5)
nRBC: 0 % (ref 0.0–0.2)

## 2022-03-16 LAB — BASIC METABOLIC PANEL
Anion gap: 8 (ref 5–15)
BUN: 38 mg/dL — ABNORMAL HIGH (ref 8–23)
CO2: 18 mmol/L — ABNORMAL LOW (ref 22–32)
Calcium: 8.1 mg/dL — ABNORMAL LOW (ref 8.9–10.3)
Chloride: 117 mmol/L — ABNORMAL HIGH (ref 98–111)
Creatinine, Ser: 1.24 mg/dL (ref 0.61–1.24)
GFR, Estimated: >60 mL/min (ref >60)
Glucose, Bld: 96 mg/dL (ref 70–99)
Potassium: 4.6 mmol/L (ref 3.5–5.1)
Sodium: 143 mmol/L (ref 135–145)

## 2022-03-16 NOTE — Plan of Care (Signed)
  Problem: Acute Rehab PT Goals(only PT should resolve) Goal: Pt Will Go Supine/Side To Sit Outcome: Progressing Flowsheets (Taken 03/16/2022 1404) Pt will go Supine/Side to Sit:  with min guard assist  with minimal assist Goal: Patient Will Transfer Sit To/From Stand Outcome: Progressing Flowsheets (Taken 03/16/2022 1404) Patient will transfer sit to/from stand:  with min guard assist  with minimal assist Goal: Pt Will Transfer Bed To Chair/Chair To Bed Outcome: Progressing Flowsheets (Taken 03/16/2022 1404) Pt will Transfer Bed to Chair/Chair to Bed:  min guard assist  with min assist Goal: Pt Will Ambulate Outcome: Progressing Flowsheets (Taken 03/16/2022 1404) Pt will Ambulate:  25 feet  with minimal assist  with cane Note: Single point cane, quad cane   2:05 PM, 03/16/22 Lonell Grandchild, MPT Physical Therapist with [facility] [phone] office [phone] mobile phone

## 2022-03-16 NOTE — Progress Notes (Signed)
PROGRESS NOTE Geoffrey Patterson  WLN:[id number] DOB: March 21, 1951 DOA: 03/14/2022 PCP: Celene Squibb, MD   Brief Narrative/Hospital Course: 71 year old male with history of gout, hypertension,?  A-fib brought to the ED with weakness unable to get up and poor appetite following his left shoulder surgery on 9/24 for left shoulder fracture which he sustained after he fell while walking on 02/17/2022. In the ED vitals stable significant AKI with creatinine 74.0, metabolic acidosis EKG with A-fib, leukocytosis chest x-ray patchy retrocardiac airspace opacity in the left lower lobe atelectasis or pneumonia, Foley catheter was inserted immediately drained 2.9 L of pink-tinged urine given IV vancomycin and cefepime nephrology was discussed and admitted after giving a dose of bicarb. Patient admitted on IV fluid hydration AKI has resolved, seen by urology at this time medically stable for discharge  Subjective: Seen examined this morning alert awake oriented resting comfortably, Foley catheter with clear urine.     Assessment and Plan: Principal Problem:   AKI (acute kidney injury) ([place]) Active Problems:   Acute urinary retention   Essential hypertension   Atrial fibrillation (HCC)   Dyspnea  AKI  Metabolic acidosis 2/2 AKI Hyperkalemia 2/2 AKI: AKI most likely combination of postobstructive AKI along with prerenal with dehydration: AKI resolved with IV fluids and Foley insertion.  Continue to avoid ARB NSAIDs and follow-up with CBC BMP in a week Recent Labs  Lab 03/14/22 1330 03/15/22 0715 03/16/22 0431  BUN 160* 75* 38*  CREATININE 12.49* 3.37* 1.24    Acute urine retention due to AKI with underlying BPH: Started on tamsulosin, Foley inserted-seen by  urology and will continue on that.    Solid mass in the urinary bladder measuring 6.2 X5.5X 2.9 cm, he will follow-up with urology outpatient   Pyuria with WBC more than 50 RBC more than 50 LE large.  No significant urinary  complaints Leukocytosis:?  Etiology procalcitonin not elevated at 0.3 normal lactic acid question hemoconcentration in the setting of AKI/dehydration, WBC normalized with overnight hydration.  Follow-up culture data, on empiric ceftriaxone/Romycin until cultures negative   Mildly elevated troponin flat suspect in the setting of severe AKI/demand ischemia.  No chest pain   Dyspnea on exertion: Likely atelectasis procalcitonin slightly up placed on empiric antibiotics for possible pneumonia which I doubt -repeat chest x-ray 10/3 pending. PTOT   A-fib rate controlled on Cardizem not on anticoagulation, does not remember being on [facility]- has hemorrhoids and bleeding issues. Advised op fu with cards and pcp. I discussed with him and his siter. Cont Cardizem as BP tolerates  Essential hypertension: Continues on medication   Class I Obesity:Patient's Body mass index is 32.55 kg/m. : Will benefit with PCP follow-up, weight loss  healthy lifestyle and outpatient sleep evaluation.   left shoulder surgery on 9/24 for left shoulder fracture which he sustained after he fell while walking on 02/17/2022.  Surgery/dressing placed.  Orthopedic surgeon was aware-they will follow-up as outpatient.    DVT prophylaxis: heparin injection 5,000 Units Start: 03/14/22 2200 Code Status:   Code Status: Full Code Family Communication: plan of care discussed with patient/family at bedside. Patient status is: Inpatient because of severe AKI Level of care: Med-Surg   Dispo: The patient is from: home            Anticipated disposition: PT OT advising skilled nursing facility patient and sister would like to proceed, awaiting placement.  He is medically stable for discharge   Mobility Assessment (last 72 hours)     Mobility Assessment  [place] Name 03/16/22 0800 03/15/22 1951 03/15/22 1802       Does patient have an order for bedrest or is patient medically unstable No - Continue assessment No - Continue assessment No -  Continue assessment     What is the highest level of mobility based on the progressive mobility assessment? Level 4 (Walks with assist in room) - Balance while marching in place and cannot step forward and back - Complete Level 4 (Walks with assist in room) - Balance while marching in place and cannot step forward and back - Complete Level 4 (Walks with assist in room) - Balance while marching in place and cannot step forward and back - Complete               Objective: Vitals last 24 hrs: Vitals:   03/15/22 0428 03/15/22 1610 03/15/22 2103 03/16/22 0433  BP: (!) 165/91 (!) 152/87 (!) 154/74 138/78  Pulse: 91 85 87 83  Resp: '20  14 16  '$ Temp: 98.3 F (36.8 C) 98.4 F (36.9 C) 98 F (36.7 C) 98 F (36.7 C)  TempSrc:  Oral Oral Oral  SpO2: 97% 98% 96% 97%  Weight:      Height:       Weight change:   Physical Examination: General exam: AAOX3, weak appearing. HEENT:Oral mucosa moist, Ear/Nose WNL grossly, dentition normal. Respiratory system: bilaterally clear,no use of accessory muscle Cardiovascular system: S1 & S2 +, No JVD,. Gastrointestinal system: Abdomen soft,NT,ND,BS+ Nervous System:Alert, awake, moving extremities and grossly nonfocal Extremities: LE ankle edema NEG, distal peripheral pulses palpable.  Skin: No rashes,no icterus. MSK: Normal muscle bulk,tone, power   Medications reviewed:  Scheduled Meds:  Chlorhexidine Gluconate Cloth  6 each Topical Q0600   diltiazem  120 mg Oral Daily   heparin  5,000 Units Subcutaneous Q8H   hydrocortisone   Rectal BID   tamsulosin  0.4 mg Oral QPC supper   Continuous Infusions:  azithromycin Stopped (03/15/22 2354)   cefTRIAXone (ROCEPHIN)  IV 2 g (03/15/22 1508)      Diet Order             Diet Heart Room service appropriate? Yes; Fluid consistency: Thin  Diet effective now                            Intake/Output Summary (Last 24 hours) at 03/16/2022 1221 Last data filed at 03/16/2022 0900 Gross per 24  hour  Intake 3116.2 ml  Output 3150 ml  Net -33.8 ml    Net IO Since Admission: -7,613.8 mL [03/16/22 1221]  Wt Readings from Last 3 Encounters:  03/14/22 108.9 kg  03/07/22 108.9 kg  01/25/17 114.9 kg     Unresulted Labs (From admission, onward)     Start     Ordered   03/16/22 2683  Basic metabolic panel  Daily at 5am,   R     Question:  Specimen collection method  Answer:  Lab=Lab collect   03/15/22 0945   03/16/22 0500  CBC  Daily at 5am,   R     Question:  Specimen collection method  Answer:  Lab=Lab collect   03/15/22 0945          Data Reviewed: I have personally reviewed following labs and imaging studies CBC: Recent Labs  Lab 03/14/22 1330 03/15/22 0714 03/16/22 0431  WBC 14.1* 8.6 8.4  NEUTROABS 12.1*  --   --   HGB 12.2* 10.5* 10.6*  HCT 34.2* 30.7* 31.7*  MCV 93.2 93.3 97.5  PLT 165 137* 426    Basic Metabolic Panel: Recent Labs  Lab 03/14/22 1330 03/15/22 0715 03/16/22 0431  NA 131* 142 143  K 5.9* 4.5 4.6  CL 99 121* 117*  CO2 13* 15* 18*  GLUCOSE 116* 95 96  BUN 160* 75* 38*  CREATININE 12.49* 3.37* 1.24  CALCIUM 8.0* 7.4* 8.1*    GFR: Estimated Creatinine Clearance: 67.6 mL/min (by C-G formula based on SCr of 1.24 mg/dL). Liver Function Tests: Recent Labs  Lab 03/14/22 1330  AST 42*  ALT 20  ALKPHOS 154*  BILITOT 1.0  PROT 5.9*  ALBUMIN 2.7*    No results for input(s): "LIPASE", "AMYLASE" in the last 168 hours. No results for input(s): "AMMONIA" in the last 168 hours. Coagulation Profile: No results for input(s): "INR", "PROTIME" in the last 168 hours. BNP (last 3 results) No results for input(s): "PROBNP" in the last 8760 hours. HbA1C: No results for input(s): "HGBA1C" in the last 72 hours. CBG: No results for input(s): "GLUCAP" in the last 168 hours. Lipid Profile: No results for input(s): "CHOL", "HDL", "LDLCALC", "TRIG", "CHOLHDL", "LDLDIRECT" in the last 72 hours. Thyroid Function Tests: No results for input(s):  "TSH", "T4TOTAL", "FREET4", "T3FREE", "THYROIDAB" in the last 72 hours. Sepsis Labs: Recent Labs  Lab 03/14/22 1439 03/14/22 1801  PROCALCITON  --  0.36  LATICACIDVEN 1.1 1.6     Recent Results (from the past 240 hour(s))  Surgical pcr screen     Status: None   Collection Time: 03/07/22  8:25 AM   Specimen: Nasal Mucosa; Nasal Swab  Result Value Ref Range Status   MRSA, PCR NEGATIVE NEGATIVE Final   Staphylococcus aureus NEGATIVE NEGATIVE Final    Comment: (NOTE) The Xpert SA Assay (FDA approved for NASAL specimens in patients 46 years of age and older), is one component of a comprehensive surveillance program. It is not intended to diagnose infection nor to guide or monitor treatment. Performed at [facility], [place] [address], [place], [place] [address]   Resp Panel by RT-PCR (Flu A&B, Covid) Anterior Nasal Swab     Status: None   Collection Time: 03/14/22  1:39 PM   Specimen: Anterior Nasal Swab  Result Value Ref Range Status   SARS Coronavirus 2 by RT PCR NEGATIVE NEGATIVE Final    Comment: (NOTE) SARS-CoV-2 target nucleic acids are NOT DETECTED.  The SARS-CoV-2 RNA is generally detectable in upper respiratory specimens during the acute phase of infection. The lowest concentration of SARS-CoV-2 viral copies this assay can detect is 138 copies/mL. A negative result does not preclude SARS-Cov-2 infection and should not be used as the sole basis for treatment or other patient management decisions. A negative result may occur with  improper specimen collection/handling, submission of specimen other than nasopharyngeal swab, presence of viral mutation(s) within the areas targeted by this assay, and inadequate number of viral copies(<138 copies/mL). A negative result must be combined with clinical observations, patient history, and epidemiological information. The expected result is Negative.  Fact Sheet for Patients:   EntrepreneurPulse.com.au  Fact Sheet for Healthcare Providers:  IncredibleEmployment.be  This test is no t yet approved or cleared by the [place] FDA and  has been authorized for detection and/or diagnosis of SARS-CoV-2 by FDA under an Emergency Use Authorization (EUA). This EUA will remain  in effect (meaning this test can be used) for the duration of the COVID-19 declaration under Section 564(b)(1) of the  Act, 21 U.S.C.section 360bbb-3(b)(1), unless the authorization is terminated  or revoked sooner.       Influenza A by PCR NEGATIVE NEGATIVE Final   Influenza B by PCR NEGATIVE NEGATIVE Final    Comment: (NOTE) The Xpert Xpress SARS-CoV-2/FLU/RSV plus assay is intended as an aid in the diagnosis of influenza from Nasopharyngeal swab specimens and should not be used as a sole basis for treatment. Nasal washings and aspirates are unacceptable for Xpert Xpress SARS-CoV-2/FLU/RSV testing.  Fact Sheet for Patients: EntrepreneurPulse.com.au  Fact Sheet for Healthcare Providers: IncredibleEmployment.be  This test is not yet approved or cleared by the [place] FDA and has been authorized for detection and/or diagnosis of SARS-CoV-2 by FDA under an Emergency Use Authorization (EUA). This EUA will remain in effect (meaning this test can be used) for the duration of the COVID-19 declaration under Section 564(b)(1) of the Act, 21 U.S.C. section 360bbb-3(b)(1), unless the authorization is terminated or revoked.  Performed at [facility], [address] [place], [place] [address]   Blood culture (routine x 2)     Status: None (Preliminary result)   Collection Time: 03/14/22  2:39 PM   Specimen: BLOOD  Result Value Ref Range Status   Specimen Description BLOOD RIGHT ANTECUBITAL  Final   Special Requests   Final    BOTTLES DRAWN AEROBIC AND ANAEROBIC Blood Culture adequate volume   Culture   Final     NO GROWTH 2 DAYS Performed at [facility], [address] [place], [place] [address]    Report Status PENDING  Incomplete  Blood culture (routine x 2)     Status: None (Preliminary result)   Collection Time: 03/14/22  2:39 PM   Specimen: BLOOD  Result Value Ref Range Status   Specimen Description BLOOD BLOOD RIGHT HAND  Final   Special Requests   Final    BOTTLES DRAWN AEROBIC AND ANAEROBIC Blood Culture adequate volume   Culture   Final    NO GROWTH 2 DAYS Performed at [facility], [address] [place], [place] [address]    Report Status PENDING  Incomplete  MRSA Next Gen by PCR, Nasal     Status: None   Collection Time: 03/14/22  4:07 PM   Specimen: Nasal Mucosa; Nasal Swab  Result Value Ref Range Status   MRSA by PCR Next Gen NOT DETECTED NOT DETECTED Final    Comment: (NOTE) The GeneXpert MRSA Assay (FDA approved for NASAL specimens only), is one component of a comprehensive MRSA colonization surveillance program. It is not intended to diagnose MRSA infection nor to guide or monitor treatment for MRSA infections. Test performance is not FDA approved in patients less than 23 years old. Performed at [facility], [address] [place], [place] [address]     Antimicrobials: Anti-infectives (From admission, onward)    Start     Dose/Rate Route Frequency Ordered Stop   03/15/22 1500  cefTRIAXone (ROCEPHIN) 2 g in sodium chloride 0.9 % 100 mL IVPB        2 g 200 mL/hr over 30 Minutes Intravenous Every 24 hours 03/14/22 2122     03/15/22 0300  vancomycin (VANCOREADY) IVPB 750 mg/150 mL  Status:  Discontinued        750 mg 150 mL/hr over 60 Minutes Intravenous Every 12 hours 03/14/22 1455 03/14/22 1933   03/15/22 0000  ceFEPIme (MAXIPIME) 1 g in sodium chloride 0.9 % 100 mL IVPB  Status:  Discontinued  1 g 200 mL/hr over 30 Minutes Intravenous Every 24 hours 03/14/22 1957 03/14/22 2122   03/14/22 2215  azithromycin (ZITHROMAX) 500 mg in sodium chloride 0.9 % 250 mL  IVPB        500 mg 250 mL/hr over 60 Minutes Intravenous Every 24 hours 03/14/22 2122     03/14/22 2200  ceFEPIme (MAXIPIME) 2 g in sodium chloride 0.9 % 100 mL IVPB  Status:  Discontinued        2 g 200 mL/hr over 30 Minutes Intravenous Every 8 hours 03/14/22 1455 03/14/22 1957   03/14/22 1500  vancomycin (VANCOCIN) IVPB 1000 mg/200 mL premix  Status:  Discontinued        1,000 mg 200 mL/hr over 60 Minutes Intravenous  Once 03/14/22 1449 03/14/22 1454   03/14/22 1500  ceFEPIme (MAXIPIME) 2 g in sodium chloride 0.9 % 100 mL IVPB        2 g 200 mL/hr over 30 Minutes Intravenous  Once 03/14/22 1449 03/14/22 1628   03/14/22 1500  vancomycin (VANCOREADY) IVPB 2000 mg/400 mL        2,000 mg 200 mL/hr over 120 Minutes Intravenous  Once 03/14/22 1454 03/14/22 1845      Culture/Microbiology    Component Value Date/Time   SDES BLOOD RIGHT ANTECUBITAL 03/14/2022 1439   SDES BLOOD BLOOD RIGHT HAND 03/14/2022 1439   SPECREQUEST  03/14/2022 1439    BOTTLES DRAWN AEROBIC AND ANAEROBIC Blood Culture adequate volume   SPECREQUEST  03/14/2022 1439    BOTTLES DRAWN AEROBIC AND ANAEROBIC Blood Culture adequate volume   CULT  03/14/2022 1439    NO GROWTH 2 DAYS Performed at [facility], [address] [place], [place] [address]    [place]  03/14/2022 1439    NO GROWTH 2 DAYS Performed at [facility], [address] [place], [place] [address]    REPTSTATUS PENDING 03/14/2022 1439   REPTSTATUS PENDING 03/14/2022 1439  Radiology Studies: US Renal  Result Date: 03/15/2022 CLINICAL DATA:  Acute kidney injury. EXAM: RENAL / URINARY TRACT ULTRASOUND COMPLETE COMPARISON:  None Available. FINDINGS: Right Kidney: Renal measurements: 12.6 x 7.6 x 6.1 cm = volume: 305 mL. Echogenicity within normal limits. No mass or hydronephrosis visualized. Left Kidney: Renal measurements: 13.1 x 7.9 x 6.6 cm = volume: 358 mL. Echogenicity within normal limits. No mass or hydronephrosis visualized. Bladder: There appears  to be a solid mass in the urinary bladder measuring 6.2 x 5.5 x 2.9 cm. Other: None. IMPRESSION: Kidneys are unremarkable. However, there appears to be a solid mass in the urinary bladder measuring 6.2 x 5.5 x 2.9 cm. Further evaluation with CT scan or cystoscopy is recommended. These results will be called to the ordering clinician or representative by the Radiologist Assistant, and communication documented in the PACS or zVision Dashboard. Electronically Signed   By: Marijo Conception M.D.   On: 03/15/2022 13:17   DG Chest Port 1 View  Result Date: 03/14/2022 CLINICAL DATA:  Fatigue. EXAM: PORTABLE CHEST 1 VIEW COMPARISON:  None Available. FINDINGS: Mildly enlarged cardiac silhouette. Patchy retrocardiac airspace opacity in the left lower lobe with probable small left pleural effusion. Right lung is clear. No pneumothorax. Left total shoulder arthroplasty. No acute osseous abnormality. IMPRESSION: Patchy retrocardiac airspace opacity in the left lower lobe with probable small left pleural effusion, which could reflect atelectasis versus pneumonia in the appropriate clinical context. Electronically Signed   By: Ileana Roup M.D.   On: 03/14/2022 14:22  LOS: 2 days   Antonieta Pert, MD [facility]  03/16/2022, 12:21 PM

## 2022-03-16 NOTE — Consult Note (Signed)
Urology Consult  Referring physician: Dr. Maren Beach Reason for referral: Urinary retention, bladder mass  Chief Complaint: difficulty urinating   History of Present Illness: Geoffrey Patterson is a 71yo who was admitted with fatigue and acute renal failure. He underwent shoulder surgery 2 weeks ago and for the past week he has felt more fatigued. He has longstanding difficulty urinating for over 2 years. He has a weak urinary stream, nocturia 3-5x, urinary frequency every 1-2 hours, straining to urinate and occasional urinary incontinence. On presentation to the ER hs creatinine was 12.5. A foley was placed and 2.9L of urine was drained. Creatinine has improved to 1.24 this morning. He underwent renal US yesterday which shows a possible 6cm bladder mass. He denies any history of gross hematuria or dysuria. He has a previous tobacco abuse history.   Past Medical History:  Diagnosis Date   Dysrhythmia    a-fib history   Gout    Hypertension    Past Surgical History:  Procedure Laterality Date   COLONOSCOPY     COLONOSCOPY N/A 12/30/2016   Procedure: COLONOSCOPY;  Surgeon: Rogene Houston, MD;  [facility];  Service: Endoscopy;  Laterality: N/A;  1030   POLYPECTOMY  12/30/2016   Procedure: POLYPECTOMY;  Surgeon: Rogene Houston, MD;  [facility];  Service: Endoscopy;;  colon    REVERSE SHOULDER ARTHROPLASTY Left 03/07/2022   Procedure: REVERSE SHOULDER ARTHROPLASTY;  Surgeon: Marchia Bond, MD;  Location: [facility];  Service: Orthopedics;  Laterality: Left;   TONSILLECTOMY      Medications: I have reviewed the patient's current medications. Allergies: No Known Allergies  Family History  Problem Relation Age of Onset   Hypertension Mother    Hypertension Father    Diabetic kidney disease Father    Social History:  reports that he has quit smoking. His smoking use included cigarettes. He has never used smokeless tobacco. He reports that he does not drink alcohol and does  not use drugs.  Review of Systems  Genitourinary:  Positive for decreased urine volume, difficulty urinating, frequency and urgency.  All other systems reviewed and are negative.   Physical Exam:  Vital signs in last 24 hours: Temp:  [98 F (36.7 C)-98.4 F (36.9 C)] 98 F (36.7 C) (10/03 0433) Pulse Rate:  [83-87] 83 (10/03 0433) Resp:  [14-16] 16 (10/03 0433) BP: (138-154)/(74-87) 138/78 (10/03 0433) SpO2:  [96 %-98 %] 97 % (10/03 0433) Physical Exam Constitutional:      Appearance: Normal appearance.  HENT:     Head: Normocephalic and atraumatic.     Nose: Nose normal.     Mouth/Throat:     Mouth: Mucous membranes are dry.  Eyes:     Extraocular Movements: Extraocular movements intact.     Pupils: Pupils are equal, round, and reactive to light.  Cardiovascular:     Rate and Rhythm: Normal rate and regular rhythm.  Pulmonary:     Effort: Pulmonary effort is normal. No respiratory distress.  Abdominal:     General: Abdomen is flat. There is no distension.  Genitourinary:    Penis: Normal.      Testes: Normal.     Epididymis:     Right: Normal.     Left: Normal.     Prostate: Enlarged.  Musculoskeletal:     Cervical back: Normal range of motion and neck supple.  Skin:    General: Skin is warm and dry.  Neurological:     General: No focal deficit  present.     Mental Status: He is alert and oriented to person, place, and time.  Psychiatric:        Mood and Affect: Mood normal.        Behavior: Behavior normal.        Thought Content: Thought content normal.        Judgment: Judgment normal.     Laboratory Data:  Results for orders placed or performed during the hospital encounter of 03/14/22 (from the past 72 hour(s))  Urinalysis, Routine w reflex microscopic Anterior Nasal Swab     Status: Abnormal   Collection Time: 03/14/22  1:09 PM  Result Value Ref Range   Color, Urine AMBER (A) YELLOW   APPearance HAZY (A) CLEAR   Specific Gravity, Urine 1.010 1.005  - 1.030   pH 6.0 5.0 - 8.0   Glucose, UA NEGATIVE NEGATIVE mg/dL   Hgb urine dipstick LARGE (A) NEGATIVE   Bilirubin Urine NEGATIVE NEGATIVE   Ketones, ur NEGATIVE NEGATIVE mg/dL   Protein, ur 100 (A) NEGATIVE mg/dL   Nitrite NEGATIVE NEGATIVE   Leukocytes,Ua LARGE (A) NEGATIVE   RBC / HPF >50 (H) 0 - 5 RBC/hpf   WBC, UA >50 (H) 0 - 5 WBC/hpf   Bacteria, UA NONE SEEN NONE SEEN   WBC Clumps PRESENT     Comment: Performed at [facility], [address], [place], [place] [address]  Brain natriuretic peptide     Status: None   Collection Time: 03/14/22  1:30 PM  Result Value Ref Range   B Natriuretic Peptide 79.0 0.0 - 100.0 pg/mL    Comment: Performed at [facility], [address], [place], [place] [address]  Comprehensive metabolic panel     Status: Abnormal   Collection Time: 03/14/22  1:30 PM  Result Value Ref Range   Sodium 131 (L) 135 - 145 mmol/L   Potassium 5.9 (H) 3.5 - 5.1 mmol/L   Chloride 99 98 - 111 mmol/L   CO2 13 (L) 22 - 32 mmol/L   Glucose, Bld 116 (H) 70 - 99 mg/dL    Comment: Glucose reference range applies only to samples taken after fasting for at least 8 hours.   BUN 160 (H) 8 - 23 mg/dL    Comment: RESULTS CONFIRMED BY MANUAL DILUTION   Creatinine, Ser 12.49 (H) 0.61 - 1.24 mg/dL   Calcium 8.0 (L) 8.9 - 10.3 mg/dL   Total Protein 5.9 (L) 6.5 - 8.1 g/dL   Albumin 2.7 (L) 3.5 - 5.0 g/dL   AST 42 (H) 15 - 41 U/L   ALT 20 0 - 44 U/L   Alkaline Phosphatase 154 (H) 38 - 126 U/L   Total Bilirubin 1.0 0.3 - 1.2 mg/dL   GFR, Estimated 4 (L) >60 mL/min    Comment: (NOTE) Calculated using the CKD-EPI Creatinine Equation (2021)    Anion gap 19 (H) 5 - 15    Comment: Performed at [facility], [address], [place], [place] [address]  CBC with Differential     Status: Abnormal   Collection Time: 03/14/22  1:30 PM  Result Value Ref Range   WBC 14.1 (H) 4.0 - 10.5 K/uL   RBC 3.67 (L) 4.22 - 5.81 MIL/uL   Hemoglobin 12.2 (L) 13.0 - 17.0 g/dL   HCT 34.2 (L) 39.0  - 52.0 %   MCV 93.2 80.0 - 100.0 fL   MCH 33.2 26.0 - 34.0 pg   MCHC 35.7 30.0 - 36.0 g/dL  RDW 12.4 11.5 - 15.5 %   Platelets 165 150 - 400 K/uL   nRBC 0.0 0.0 - 0.2 %   Neutrophils Relative % 86 %   Neutro Abs 12.1 (H) 1.7 - 7.7 K/uL   Lymphocytes Relative 4 %   Lymphs Abs 0.6 (L) 0.7 - 4.0 K/uL   Monocytes Relative 7 %   Monocytes Absolute 1.0 0.1 - 1.0 K/uL   Eosinophils Relative 2 %   Eosinophils Absolute 0.2 0.0 - 0.5 K/uL   Basophils Relative 0 %   Basophils Absolute 0.0 0.0 - 0.1 K/uL   Immature Granulocytes 1 %   Abs Immature Granulocytes 0.08 (H) 0.00 - 0.07 K/uL    Comment: Performed at [facility] [address] [place], [place] [address]  Troponin I (High Sensitivity)     Status: Abnormal   Collection Time: 03/14/22  1:30 PM  Result Value Ref Range   Troponin I (High Sensitivity) 84 (H) <18 ng/L    Comment: (NOTE) Elevated high sensitivity troponin I (hsTnI) values and significant  changes across serial measurements may suggest ACS but many other  chronic and acute conditions are known to elevate hsTnI results.  Refer to the Links section for chest pain algorithms and additional  guidance. Performed at [facility], [address], [place], [place] [address]   Resp Panel by RT-PCR (Flu A&B, Covid) Anterior Nasal Swab     Status: None   Collection Time: 03/14/22  1:39 PM   Specimen: Anterior Nasal Swab  Result Value Ref Range   SARS Coronavirus 2 by RT PCR NEGATIVE NEGATIVE    Comment: (NOTE) SARS-CoV-2 target nucleic acids are NOT DETECTED.  The SARS-CoV-2 RNA is generally detectable in upper respiratory specimens during the acute phase of infection. The lowest concentration of SARS-CoV-2 viral copies this assay can detect is 138 copies/mL. A negative result does not preclude SARS-Cov-2 infection and should not be used as the sole basis for treatment or other patient management decisions. A negative result may occur with  improper specimen  collection/handling, submission of specimen other than nasopharyngeal swab, presence of viral mutation(s) within the areas targeted by this assay, and inadequate number of viral copies(<138 copies/mL). A negative result must be combined with clinical observations, patient history, and epidemiological information. The expected result is Negative.  Fact Sheet for Patients:  EntrepreneurPulse.com.au  Fact Sheet for Healthcare Providers:  IncredibleEmployment.be  This test is no t yet approved or cleared by the [place] FDA and  has been authorized for detection and/or diagnosis of SARS-CoV-2 by FDA under an Emergency Use Authorization (EUA). This EUA will remain  in effect (meaning this test can be used) for the duration of the COVID-19 declaration under Section 564(b)(1) of the Act, 21 U.S.C.section 360bbb-3(b)(1), unless the authorization is terminated  or revoked sooner.       Influenza A by PCR NEGATIVE NEGATIVE   Influenza B by PCR NEGATIVE NEGATIVE    Comment: (NOTE) The Xpert Xpress SARS-CoV-2/FLU/RSV plus assay is intended as an aid in the diagnosis of influenza from Nasopharyngeal swab specimens and should not be used as a sole basis for treatment. Nasal washings and aspirates are unacceptable for Xpert Xpress SARS-CoV-2/FLU/RSV testing.  Fact Sheet for Patients: EntrepreneurPulse.com.au  Fact Sheet for Healthcare Providers: IncredibleEmployment.be  This test is not yet approved or cleared by the [place] FDA and has been authorized for detection and/or diagnosis of SARS-CoV-2 by FDA under an Emergency Use Authorization (EUA). This EUA will remain in  effect (meaning this test can be used) for the duration of the COVID-19 declaration under Section 564(b)(1) of the Act, 21 U.S.C. section 360bbb-3(b)(1), unless the authorization is terminated or revoked.  Performed at [facility],  [address] [place], [place] [address]   Blood culture (routine x 2)     Status: None (Preliminary result)   Collection Time: 03/14/22  2:39 PM   Specimen: BLOOD  Result Value Ref Range   Specimen Description BLOOD RIGHT ANTECUBITAL    Special Requests      BOTTLES DRAWN AEROBIC AND ANAEROBIC Blood Culture adequate volume   Culture      NO GROWTH 2 DAYS Performed at [facility], [address], [place], [place] [address]    Report Status PENDING   Blood culture (routine x 2)     Status: None (Preliminary result)   Collection Time: 03/14/22  2:39 PM   Specimen: BLOOD  Result Value Ref Range   Specimen Description BLOOD BLOOD RIGHT HAND    Special Requests      BOTTLES DRAWN AEROBIC AND ANAEROBIC Blood Culture adequate volume   Culture      NO GROWTH 2 DAYS Performed at [facility], [address], [place], [place] [address]    Report Status PENDING   Lactic acid, plasma     Status: None   Collection Time: 03/14/22  2:39 PM  Result Value Ref Range   Lactic Acid, Venous 1.1 0.5 - 1.9 mmol/L    Comment: Performed at [facility], [address] [place], [place] [address]  MRSA Next Gen by PCR, Nasal     Status: None   Collection Time: 03/14/22  4:07 PM   Specimen: Nasal Mucosa; Nasal Swab  Result Value Ref Range   MRSA by PCR Next Gen NOT DETECTED NOT DETECTED    Comment: (NOTE) The GeneXpert MRSA Assay (FDA approved for NASAL specimens only), is one component of a comprehensive MRSA colonization surveillance program. It is not intended to diagnose MRSA infection nor to guide or monitor treatment for MRSA infections. Test performance is not FDA approved in patients less than 96 years old. Performed at [facility], [address], [place], [place] [address]   Lactic acid, plasma     Status: None   Collection Time: 03/14/22  6:01 PM  Result Value Ref Range   Lactic Acid, Venous 1.6 0.5 - 1.9 mmol/L    Comment: Performed at [facility], [address], [place], [place] [address]   Troponin I (High Sensitivity)     Status: Abnormal   Collection Time: 03/14/22  6:01 PM  Result Value Ref Range   Troponin I (High Sensitivity) 63 (H) <18 ng/L    Comment: DELTA CHECK NOTED (NOTE) Elevated high sensitivity troponin I (hsTnI) values and significant  changes across serial measurements may suggest ACS but many other  chronic and acute conditions are known to elevate hsTnI results.  Refer to the Links section for chest pain algorithms and additional  guidance. Performed at [facility], [address], [place], [place] [address]   CK     Status: None   Collection Time: 03/14/22  6:01 PM  Result Value Ref Range   Total CK 49 49 - 397 U/L    Comment: Performed at [facility], [address] [place], [place] [address]  Procalcitonin - Baseline     Status: None   Collection Time: 03/14/22  6:01 PM  Result Value Ref Range   Procalcitonin 0.36 ng/mL  Comment:        Interpretation: PCT (Procalcitonin) <= 0.5 ng/mL: Systemic infection (sepsis) is not likely. Local bacterial infection is possible. (NOTE)       Sepsis PCT Algorithm           Lower Respiratory Tract                                      Infection PCT Algorithm    ----------------------------     ----------------------------         PCT < 0.25 ng/mL                PCT < 0.10 ng/mL          Strongly encourage             Strongly discourage   discontinuation of antibiotics    initiation of antibiotics    ----------------------------     -----------------------------       PCT 0.25 - 0.50 ng/mL            PCT 0.10 - 0.25 ng/mL               OR       >80% decrease in PCT            Discourage initiation of                                            antibiotics      Encourage discontinuation           of antibiotics    ----------------------------     -----------------------------         PCT >= 0.50 ng/mL              PCT 0.26 - 0.50 ng/mL               AND        <80% decrease in PCT              Encourage initiation of                                             antibiotics       Encourage continuation           of antibiotics    ----------------------------     -----------------------------        PCT >= 0.50 ng/mL                  PCT > 0.50 ng/mL               AND         increase in PCT                  Strongly encourage                                      initiation of antibiotics    Strongly encourage escalation           of antibiotics                                     -----------------------------  PCT <= 0.25 ng/mL                                                 OR                                        > 80% decrease in PCT                                      Discontinue / Do not initiate                                             antibiotics  Performed at [facility], [address], [place], [place] [address]   CBC     Status: Abnormal   Collection Time: 03/15/22  7:14 AM  Result Value Ref Range   WBC 8.6 4.0 - 10.5 K/uL   RBC 3.29 (L) 4.22 - 5.81 MIL/uL   Hemoglobin 10.5 (L) 13.0 - 17.0 g/dL   HCT 30.7 (L) 39.0 - 52.0 %   MCV 93.3 80.0 - 100.0 fL   MCH 31.9 26.0 - 34.0 pg   MCHC 34.2 30.0 - 36.0 g/dL   RDW 12.3 11.5 - 15.5 %   Platelets 137 (L) 150 - 400 K/uL   nRBC 0.0 0.0 - 0.2 %    Comment: Performed at [facility], [address], [place], [place] [address]  Basic metabolic panel     Status: Abnormal   Collection Time: 03/15/22  7:15 AM  Result Value Ref Range   Sodium 142 135 - 145 mmol/L    Comment: DELTA CHECK NOTED   Potassium 4.5 3.5 - 5.1 mmol/L    Comment: DELTA CHECK NOTED   Chloride 121 (H) 98 - 111 mmol/L   CO2 15 (L) 22 - 32 mmol/L   Glucose, Bld 95 70 - 99 mg/dL    Comment: Glucose reference range applies only to samples taken after fasting for at least 8 hours.   BUN 75 (H) 8 - 23 mg/dL   Creatinine, Ser 3.37 (H) 0.61 - 1.24 mg/dL    Comment: DELTA CHECK NOTED   Calcium 7.4 (L) 8.9 -  10.3 mg/dL   GFR, Estimated 18 (L) >60 mL/min    Comment: (NOTE) Calculated using the CKD-EPI Creatinine Equation (2021)    Anion gap 6 5 - 15    Comment: Performed at [facility], [address] [place], [place] [address]  Basic metabolic panel     Status: Abnormal   Collection Time: 03/16/22  4:31 AM  Result Value Ref Range   Sodium 143 135 - 145 mmol/L   Potassium 4.6 3.5 - 5.1 mmol/L   Chloride 117 (H) 98 - 111 mmol/L   CO2 18 (L) 22 - 32 mmol/L   Glucose, Bld 96 70 - 99 mg/dL    Comment: Glucose reference range applies only to samples taken after fasting for at least 8 hours.   BUN 38 (H) 8 - 23 mg/dL   Creatinine, Ser 1.24 0.61 - 1.24 mg/dL  Comment: DELTA CHECK NOTED   Calcium 8.1 (L) 8.9 - 10.3 mg/dL   GFR, Estimated >60 >60 mL/min    Comment: (NOTE) Calculated using the CKD-EPI Creatinine Equation (2021)    Anion gap 8 5 - 15    Comment: Performed at [facility], [address] [place], [place] [address]  CBC     Status: Abnormal   Collection Time: 03/16/22  4:31 AM  Result Value Ref Range   WBC 8.4 4.0 - 10.5 K/uL   RBC 3.25 (L) 4.22 - 5.81 MIL/uL   Hemoglobin 10.6 (L) 13.0 - 17.0 g/dL   HCT 31.7 (L) 39.0 - 52.0 %   MCV 97.5 80.0 - 100.0 fL   MCH 32.6 26.0 - 34.0 pg   MCHC 33.4 30.0 - 36.0 g/dL   RDW 12.4 11.5 - 15.5 %   Platelets 163 150 - 400 K/uL   nRBC 0.0 0.0 - 0.2 %    Comment: Performed at [facility] [address] [place], [place] [address]   Recent Results (from the past 240 hour(s))  Surgical pcr screen     Status: None   Collection Time: 03/07/22  8:25 AM   Specimen: Nasal Mucosa; Nasal Swab  Result Value Ref Range Status   MRSA, PCR NEGATIVE NEGATIVE Final   Staphylococcus aureus NEGATIVE NEGATIVE Final    Comment: (NOTE) The Xpert SA Assay (FDA approved for NASAL specimens in patients 15 years of age and older), is one component of a comprehensive surveillance program. It is not intended to diagnose infection nor to guide or monitor  treatment. Performed at [facility], [place] [address] [place], [place] [address]   Resp Panel by RT-PCR (Flu A&B, Covid) Anterior Nasal Swab     Status: None   Collection Time: 03/14/22  1:39 PM   Specimen: Anterior Nasal Swab  Result Value Ref Range Status   SARS Coronavirus 2 by RT PCR NEGATIVE NEGATIVE Final    Comment: (NOTE) SARS-CoV-2 target nucleic acids are NOT DETECTED.  The SARS-CoV-2 RNA is generally detectable in upper respiratory specimens during the acute phase of infection. The lowest concentration of SARS-CoV-2 viral copies this assay can detect is 138 copies/mL. A negative result does not preclude SARS-Cov-2 infection and should not be used as the sole basis for treatment or other patient management decisions. A negative result may occur with  improper specimen collection/handling, submission of specimen other than nasopharyngeal swab, presence of viral mutation(s) within the areas targeted by this assay, and inadequate number of viral copies(<138 copies/mL). A negative result must be combined with clinical observations, patient history, and epidemiological information. The expected result is Negative.  Fact Sheet for Patients:  EntrepreneurPulse.com.au  Fact Sheet for Healthcare Providers:  IncredibleEmployment.be  This test is no t yet approved or cleared by the [place] FDA and  has been authorized for detection and/or diagnosis of SARS-CoV-2 by FDA under an Emergency Use Authorization (EUA). This EUA will remain  in effect (meaning this test can be used) for the duration of the COVID-19 declaration under Section 564(b)(1) of the Act, 21 U.S.C.section 360bbb-3(b)(1), unless the authorization is terminated  or revoked sooner.       Influenza A by PCR NEGATIVE NEGATIVE Final   Influenza B by PCR NEGATIVE NEGATIVE Final    Comment: (NOTE) The Xpert Xpress SARS-CoV-2/FLU/RSV plus assay is intended as an aid in  the diagnosis of influenza from Nasopharyngeal swab specimens and should not be used as a sole basis  for treatment. Nasal washings and aspirates are unacceptable for Xpert Xpress SARS-CoV-2/FLU/RSV testing.  Fact Sheet for Patients: EntrepreneurPulse.com.au  Fact Sheet for Healthcare Providers: IncredibleEmployment.be  This test is not yet approved or cleared by the [place] FDA and has been authorized for detection and/or diagnosis of SARS-CoV-2 by FDA under an Emergency Use Authorization (EUA). This EUA will remain in effect (meaning this test can be used) for the duration of the COVID-19 declaration under Section 564(b)(1) of the Act, 21 U.S.C. section 360bbb-3(b)(1), unless the authorization is terminated or revoked.  Performed at [facility], [address] [place], [place] [address]   Blood culture (routine x 2)     Status: None (Preliminary result)   Collection Time: 03/14/22  2:39 PM   Specimen: BLOOD  Result Value Ref Range Status   Specimen Description BLOOD RIGHT ANTECUBITAL  Final   Special Requests   Final    BOTTLES DRAWN AEROBIC AND ANAEROBIC Blood Culture adequate volume   Culture   Final    NO GROWTH 2 DAYS Performed at [facility], [address], [place], [place] [address]    Report Status PENDING  Incomplete  Blood culture (routine x 2)     Status: None (Preliminary result)   Collection Time: 03/14/22  2:39 PM   Specimen: BLOOD  Result Value Ref Range Status   Specimen Description BLOOD BLOOD RIGHT HAND  Final   Special Requests   Final    BOTTLES DRAWN AEROBIC AND ANAEROBIC Blood Culture adequate volume   Culture   Final    NO GROWTH 2 DAYS Performed at [facility], [address], [place], [place] [address]    Report Status PENDING  Incomplete  MRSA Next Gen by PCR, Nasal     Status: None   Collection Time: 03/14/22  4:07 PM   Specimen: Nasal Mucosa; Nasal Swab  Result Value Ref Range Status   MRSA by PCR  Next Gen NOT DETECTED NOT DETECTED Final    Comment: (NOTE) The GeneXpert MRSA Assay (FDA approved for NASAL specimens only), is one component of a comprehensive MRSA colonization surveillance program. It is not intended to diagnose MRSA infection nor to guide or monitor treatment for MRSA infections. Test performance is not FDA approved in patients less than 42 years old. Performed at 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 After discussing the options the patient has elected to proceed with medical therapy. We will start flomax 0.'4mg'$  daily, The patient should be discharged with the foley in place and followup in 1 week with [facility] for a voiding trial.  Bladder mass: We discussing the natural history of bladder masses and the workup including cystoscopy. The patient will have cystoscopy performed as an outpatient.   [name] 03/16/2022, 10:21 AM

## 2022-03-16 NOTE — TOC Initial Note (Signed)
Transition of Care [facility]) - Initial/Assessment Note    Patient Details  Name: Geoffrey Patterson MRN: [id number] Date of Birth: 03-03-1951  Transition of Care [facility]) CM/SW Contact:    Boneta Lucks, RN Phone Number: 03/16/2022, 12:53 PM  Clinical Narrative:      Patient admitted with AKI (acute kidney injury) lives alone. PT is recommending SNF. [name] his sister spoke with [name] at [facility]. FL2 completed and sent.  [facility] will offer and TOC started INS AUTH. MD/ RN updated.     Expected Discharge Plan: Skilled Nursing Facility Barriers to Discharge: Continued Medical Work up, Ship broker   Patient Goals and CMS Choice Patient states their goals for this hospitalization and ongoing recovery are:: agreeable to SNF CMS Medicare.gov Compare Post Acute Care list provided to:: Patient Choice offered to / list presented to : Patient  Expected Discharge Plan and Services Expected Discharge Plan: [place]       Living arrangements for the past 2 months: Single Family Home                     Prior Living Arrangements/Services Living arrangements for the past 2 months: Single Family Home Lives with:: Self Patient language and need for interpreter reviewed:: Yes Do you feel safe going back to the place where you live?: Yes      Need for Family Participation in Patient Care: Yes (Comment) Care giver support system in place?: Yes (comment)   Criminal Activity/Legal Involvement Pertinent to Current Situation/Hospitalization: No - Comment as needed  Activities of Daily Living Home Assistive Devices/Equipment: Sling (specify type) ADL Screening (condition at time of admission) Patient's cognitive ability adequate to safely complete daily activities?: Yes Is the patient deaf or have difficulty hearing?: No Does the patient have difficulty seeing, even when wearing glasses/contacts?: No Does the patient have difficulty concentrating, remembering, or making  decisions?: No Patient able to express need for assistance with ADLs?: Yes Does the patient have difficulty dressing or bathing?: Yes Independently performs ADLs?: No Communication: Independent Dressing (OT): Needs assistance Is this a change from baseline?: Change from baseline, expected to last <3days Grooming: Needs assistance Is this a change from baseline?: Change from baseline, expected to last <3 days Feeding: Needs assistance Is this a change from baseline?: Change from baseline, expected to last <3 days Bathing: Needs assistance Is this a change from baseline?: Change from baseline, expected to last <3 days Toileting: Needs assistance Is this a change from baseline?: Change from baseline, expected to last <3 days In/Out Bed: Needs assistance Is this a change from baseline?: Change from baseline, expected to last <3 days Walks in Home: Needs assistance Is this a change from baseline?: Change from baseline, expected to last <3 days Does the patient have difficulty walking or climbing stairs?: Yes Weakness of Legs: Both Weakness of Arms/Hands: Both  Permission Sought/Granted     Emotional Assessment     Affect (typically observed): Accepting Orientation: : Oriented to Self, Oriented to Place, Oriented to  Time, Oriented to Situation Alcohol / Substance Use: Not Applicable Psych Involvement: No (comment)  Admission diagnosis:  Weakness [R53.1] Elevated troponin [R79.89] AKI (acute kidney injury) ([place]) [N17.9] Pneumonia of right lower lobe due to infectious organism [J18.9] Patient Active Problem List   Diagnosis Date Noted   AKI (acute kidney injury) ([place]) 03/14/2022   Acute urinary retention 03/14/2022   Essential hypertension 03/14/2022   Atrial fibrillation ([place]) 03/14/2022   Dyspnea 03/14/2022   S/p reverse  total shoulder arthroplasty 03/07/2022   S/P reverse total shoulder arthroplasty, 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

## 2022-03-16 NOTE — Evaluation (Signed)
Physical Therapy Evaluation Patient Details Name: Geoffrey Patterson MRN: [id number] DOB: 01-10-1951 Today's Date: 03/16/2022  History of Present Illness  Geoffrey Patterson is a 71 y.o. male with medical history significant for gout, hypertension, question atrial fibrillation.  Patient was brought to the ED reports of weakness, unable to get up, poor appetite.  Patient had left reverse shoulder arthroplasty surgery a week ago,- 9/24 by Dr. Mardelle Matte-  For left shoulder fractures that he sustained when he fell while walking 02/17/2022.  Since 2 days after the surgery, he has progressively gotten weak, with poor appetite, and unable to ambulate.  At baseline he ambulates without assistance or assistive devices.  He also reports difficulty breathing with exertion none at rest, no cough.  No chest pain.  Reports poor urine output over the past few days, but he denies any pain with urination, denies straining with urination or any other urinary symptoms or diagnosis before or after the surgery.  No fevers no chills.  His left shoulder has been in a sling since the surgery, he denies increasing pain, no discharge or discoloration from the left shoulder.  He has been taking only Tylenol and tramadol for pain since the surgery.  No NSAID use since surgery.  Patient's nephew [name] is at bedside.   Clinical Impression  Patient demonstrates good return for rolling side to side in bed, labored movement with Min/mod assist for sitting up at bedside with LUE in sling, very unsteady on feet having to lean on RW with RUE when taking steps, limited mostly due to poor standing balance, fall risk and fatigue.  Patient tolerated sitting up in chair after therapy - nurse notified.  Patient will benefit from continued skilled physical therapy in hospital and recommended venue below to increase strength, balance, endurance for safe ADLs and gait.       Recommendations for follow up therapy are one component of a multi-disciplinary  discharge planning process, led by the attending physician.  Recommendations may be updated based on patient status, additional functional criteria and insurance authorization.  Follow Up Recommendations Skilled nursing-short term rehab (<3 hours/day)      Assistance Recommended at Discharge Set up Supervision/Assistance  Patient can return home with the following  A lot of help with walking and/or transfers;Help with stairs or ramp for entrance;A lot of help with bathing/dressing/bathroom;Assistance with cooking/housework    Equipment Recommendations Cane  Recommendations for Other Services       Functional Status Assessment Patient has had a recent decline in their functional status and demonstrates the ability to make significant improvements in function in a reasonable and predictable amount of time.     Precautions / Restrictions Precautions Precautions: Fall Type of Shoulder Precautions: No ROM of shoulder. Cleared for AROM of hand, wrist, elbow, and forearm. Shoulder Interventions: Shoulder sling/immobilizer;At all times;Off for dressing/bathing/exercises Precaution Comments: No ROM of shoulder Required Braces or Orthoses: Sling Restrictions Weight Bearing Restrictions: Yes LUE Weight Bearing: Non weight bearing      Mobility  Bed Mobility Overal bed mobility: Needs Assistance Bed Mobility: Rolling, Supine to Sit Rolling: Min guard   Supine to sit: Min assist, Mod assist     General bed mobility comments: increased time, labored movement    Transfers Overall transfer level: Needs assistance Equipment used: Rolling walker (2 wheels) Transfers: Sit to/from Stand, Bed to chair/wheelchair/BSC Sit to Stand: Min assist   Step pivot transfers: Min assist       General transfer comment: slow labored movement  using right hand to grip RW    Ambulation/Gait Ambulation/Gait assistance: Mod assist Gait Distance (Feet): 6 Feet Assistive device: Rolling walker (2  wheels) Gait Pattern/deviations: Decreased step length - right, Decreased step length - left, Decreased stride length Gait velocity: decreased     General Gait Details: limited to a few slow labored steps having to lean on RW with RUE to maintain standing balance  Stairs            Wheelchair Mobility    Modified Rankin (Stroke Patients Only)       Balance Overall balance assessment: Needs assistance Sitting-balance support: Feet supported, No upper extremity supported Sitting balance-Leahy Scale: Fair Sitting balance - Comments: fair/good seated at EOB   Standing balance support: During functional activity, Single extremity supported Standing balance-Leahy Scale: Poor Standing balance comment: fair/poor using RUE on RW                             Pertinent Vitals/Pain Pain Assessment Pain Assessment: Faces Faces Pain Scale: Hurts little more Pain Location: L shoulder Pain Descriptors / Indicators: Grimacing, Guarding, Discomfort, Sore Pain Intervention(s): Limited activity within patient's tolerance, Monitored during session, Repositioned    Home Living Family/patient expects to be discharged to:: Private residence Living Arrangements: Alone Available Help at Discharge: Family;Available PRN/intermittently Type of Home: House Home Access: Stairs to enter Entrance Stairs-Rails: Right;Left;Can reach both Entrance Stairs-Number of Steps: 3   Home Layout: Able to live on main level with bedroom/bathroom;Laundry or work area in basement;Other (Comment) (Patient states he does not go to basement) Home Equipment: Shower seat;Hand held shower head      Prior Function Prior Level of Function : Independent/Modified Independent             Mobility Comments: Community ambulator without AD, drives prior to left shoulder surgery ADLs Comments: Independent prior to left shoulder surgery, assisted by family since surgery     Hand Dominance   Dominant  Hand: Right    Extremity/Trunk Assessment   Upper Extremity Assessment Upper Extremity Assessment: Generalized weakness LUE Deficits / Details: right shoulder not tested due to restrictions    Lower Extremity Assessment Lower Extremity Assessment: Generalized weakness    Cervical / Trunk Assessment Cervical / Trunk Assessment: Normal  Communication   Communication: No difficulties  Cognition Arousal/Alertness: Awake/alert Behavior During Therapy: WFL for tasks assessed/performed Overall Cognitive Status: Within Functional Limits for tasks assessed                                          General Comments      Exercises     Assessment/Plan    PT Assessment Patient needs continued PT services  PT Problem List Decreased strength;Decreased activity tolerance;Decreased balance;Decreased mobility       PT Treatment Interventions DME instruction;Gait training;Stair training;Functional mobility training;Therapeutic activities;Therapeutic exercise;Patient/family education;Balance training    PT Goals (Current goals can be found in the Care Plan section)  Acute Rehab PT Goals Patient Stated Goal: return home after rehab PT Goal Formulation: With patient Time For Goal Achievement: 03/30/22 Potential to Achieve Goals: Good    Frequency Min 3X/week     Co-evaluation               AM-PAC PT "6 Clicks" Mobility  Outcome Measure Help needed turning from your back to your  side while in a flat bed without using bedrails?: A Little Help needed moving from lying on your back to sitting on the side of a flat bed without using bedrails?: A Lot Help needed moving to and from a bed to a chair (including a wheelchair)?: A Little Help needed standing up from a chair using your arms (e.g., wheelchair or bedside chair)?: A Little Help needed to walk in hospital room?: A Lot Help needed climbing 3-5 steps with a railing? : A Lot 6 Click Score: 15    End of  Session   Activity Tolerance: Patient tolerated treatment well;Patient limited by fatigue Patient left: in chair;with call bell/phone within reach Nurse Communication: Mobility status PT Visit Diagnosis: Unsteadiness on feet (R26.81);Other abnormalities of gait and mobility (R26.89);Muscle weakness (generalized) (M62.81)    Time: 7341-9379 PT Time Calculation (min) (ACUTE ONLY): 31 min   Charges:   PT Evaluation $PT Eval Moderate Complexity: 1 Mod PT Treatments $Therapeutic Activity: 23-37 mins        2:02 PM, 03/16/22 Lonell Grandchild, MPT Physical Therapist with [facility] [phone] office [phone] mobile phone

## 2022-03-16 NOTE — NC FL2 (Signed)
[place] LEVEL OF CARE SCREENING TOOL     IDENTIFICATION  Patient Name: Geoffrey Patterson Birthdate: 1950-06-23 Sex: male Admission Date (Current Location): 03/14/2022  [facility] and [place] Number:  [employer] and Address:  [place] [address], [place]      Provider Number: [id number]  Attending Physician Name and Address:  Antonieta Pert, MD  Relative Name and Phone Number:  Erling Conte (Sister)   [phone]    Current Level of Care: Hospital Recommended Level of Care: [place] Prior Approval Number:    Date Approved/Denied:   PASRR Number:    Discharge Plan: SNF    Current Diagnoses: Patient Active Problem List   Diagnosis Date Noted   AKI (acute kidney injury) ([place]) 03/14/2022   Acute urinary retention 03/14/2022   Essential hypertension 03/14/2022   Atrial fibrillation ([place]) 03/14/2022   Dyspnea 03/14/2022   S/p reverse total shoulder arthroplasty 03/07/2022   S/P reverse total shoulder arthroplasty, 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   Special screening for malignant neoplasms, colon 09/16/2016   Allergic rhinoconjunctivitis 03/06/2015    Orientation RESPIRATION BLADDER Height & Weight     Self, Time, Situation, Place  Normal External catheter Weight: 108.9 kg Height:  6' (182.9 cm)  BEHAVIORAL SYMPTOMS/MOOD NEUROLOGICAL BOWEL NUTRITION STATUS      Continent Diet (See DC summary)  AMBULATORY STATUS COMMUNICATION OF NEEDS Skin   Extensive Assist Verbally Normal                       Personal Care Assistance Level of Assistance  Bathing, Feeding, Dressing Bathing Assistance: Maximum assistance Feeding assistance: Independent Dressing Assistance: Limited assistance     Functional Limitations Info  Sight, Hearing, Speech Sight Info: Adequate Hearing Info: Adequate Speech Info: Adequate    SPECIAL CARE FACTORS FREQUENCY  PT (By licensed PT)     PT Frequency: 5 times a week               Contractures Contractures Info: Not present    Additional Factors Info  Code Status, Allergies Code Status Info: Full Allergies Info: NKDA           Current Medications (03/16/2022):  This is the current hospital active medication list Current Facility-Administered Medications  Medication Dose Route Frequency Provider Last Rate Last Admin   acetaminophen (TYLENOL) tablet 650 mg  650 mg Oral Q6H PRN Emokpae, Ejiroghene E, MD   650 mg at 03/15/22 2202   Or   acetaminophen (TYLENOL) suppository 650 mg  650 mg Rectal Q6H PRN Emokpae, Ejiroghene E, MD       azithromycin (ZITHROMAX) 500 mg in sodium chloride 0.9 % 250 mL IVPB  500 mg Intravenous Q24H Emokpae, Ejiroghene E, MD   Stopped at 03/15/22 2354   cefTRIAXone (ROCEPHIN) 2 g in sodium chloride 0.9 % 100 mL IVPB  2 g Intravenous Q24H Emokpae, Ejiroghene E, MD 200 mL/hr at 03/15/22 1508 2 g at 03/15/22 1508   Chlorhexidine Gluconate Cloth 2 % PADS 6 each  6 each Topical Q0600 Zierle-Ghosh, Asia B, DO   6 each at 03/16/22 0517   diltiazem (CARDIZEM CD) 24 hr capsule 120 mg  120 mg Oral Daily Emokpae, Ejiroghene E, MD   120 mg at 03/16/22 0831   heparin injection 5,000 Units  5,000 Units Subcutaneous Q8H Emokpae, Ejiroghene E, MD   5,000 Units at 03/16/22 0518   hydrALAZINE (APRESOLINE) injection 10 mg  10 mg Intravenous Q4H  PRN Bethena Roys, MD       hydrocortisone (ANUSOL-HC) 2.5 % rectal cream   Rectal BID Kc, Ramesh, MD   Given at 03/16/22 1140   ondansetron (ZOFRAN) tablet 4 mg  4 mg Oral Q6H PRN Emokpae, Ejiroghene E, MD       Or   ondansetron (ZOFRAN) injection 4 mg  4 mg Intravenous Q6H PRN Emokpae, Ejiroghene E, MD       polyethylene glycol (MIRALAX / GLYCOLAX) packet 17 g  17 g Oral Daily PRN Emokpae, Ejiroghene E, MD       tamsulosin (FLOMAX) capsule 0.4 mg  0.4 mg Oral QPC supper Emokpae, Ejiroghene E, MD   0.4 mg at 03/15/22 1850     Discharge Medications: Please see discharge summary for a list of discharge  medications.  Relevant Imaging Results:  Relevant Lab Results:   Additional Information SS# [id number]  Boneta Lucks, RN

## 2022-03-17 DIAGNOSIS — N179 Acute kidney failure, unspecified: Secondary | ICD-10-CM | POA: Diagnosis not present

## 2022-03-17 LAB — BASIC METABOLIC PANEL WITH GFR
Anion gap: 10 (ref 5–15)
BUN: 21 mg/dL (ref 8–23)
CO2: 18 mmol/L — ABNORMAL LOW (ref 22–32)
Calcium: 8.2 mg/dL — ABNORMAL LOW (ref 8.9–10.3)
Chloride: 112 mmol/L — ABNORMAL HIGH (ref 98–111)
Creatinine, Ser: 0.91 mg/dL (ref 0.61–1.24)
GFR, Estimated: >60 mL/min
Glucose, Bld: 108 mg/dL — ABNORMAL HIGH (ref 70–99)
Potassium: 4.3 mmol/L (ref 3.5–5.1)
Sodium: 140 mmol/L (ref 135–145)

## 2022-03-17 LAB — CBC
HCT: 31.8 % — ABNORMAL LOW (ref 39.0–52.0)
Hemoglobin: 10.7 g/dL — ABNORMAL LOW (ref 13.0–17.0)
MCH: 32.2 pg (ref 26.0–34.0)
MCHC: 33.6 g/dL (ref 30.0–36.0)
MCV: 95.8 fL (ref 80.0–100.0)
Platelets: 180 K/uL (ref 150–400)
RBC: 3.32 MIL/uL — ABNORMAL LOW (ref 4.22–5.81)
RDW: 12.2 % (ref 11.5–15.5)
WBC: 9 K/uL (ref 4.0–10.5)
nRBC: 0 % (ref 0.0–0.2)

## 2022-03-17 LAB — SARS CORONAVIRUS 2 BY RT PCR: SARS Coronavirus 2 by RT PCR: POSITIVE — AB

## 2022-03-17 MED ORDER — TAMSULOSIN HCL 0.4 MG PO CAPS: 0.4000 mg | ORAL_CAPSULE | Freq: Every day | ORAL | 0 refills | Status: DC

## 2022-03-17 NOTE — Evaluation (Signed)
Occupational Therapy Evaluation Patient Details Name: Geoffrey Patterson MRN: [id number] DOB: 1950/09/05 Today's Date: 03/17/2022   History of Present Illness Geoffrey Patterson is a 71 y.o. male with medical history significant for gout, hypertension, question atrial fibrillation.  Patient was brought to the ED reports of weakness, unable to get up, poor appetite.  Patient had left reverse shoulder arthroplasty surgery a week ago,- 9/24 by Dr. Mardelle Matte-  For left shoulder fractures that he sustained when he fell while walking 02/17/2022.  Since 2 days after the surgery, he has progressively gotten weak, with poor appetite, and unable to ambulate.  At baseline he ambulates without assistance or assistive devices.  He also reports difficulty breathing with exertion none at rest, no cough.  No chest pain.  Reports poor urine output over the past few days, but he denies any pain with urination, denies straining with urination or any other urinary symptoms or diagnosis before or after the surgery.  No fevers no chills.  His left shoulder has been in a sling since the surgery, he denies increasing pain, no discharge or discoloration from the left shoulder.  He has been taking only Tylenol and tramadol for pain since the surgery.  No NSAID use since surgery.  Patient's nephew [name] is at bedside.   Clinical Impression   Pt agreeable to OT evaluation. Pt sling adjusted at start of session to be more at 90* elbow flexion. Adjusted again when pt seated in chair at end of session. Pt demonstrates Mod I level of assist for bed mobility. Pt required moderate assist for lower body tasks in general due to difficulty maintaining L UE NWB status without assist.  Assisted needed for peri-care today with pt attempting but needing assist to complete well. Min G assist for standing tasks and transfers due to one instance of mild loss of balance when turning to transfer to toilet. Pt able to correct without physical assist. L UE in  sling for duration of session. Pt lives alone and would be a high fall risk if he returned home at this time. Pt will benefit from continued OT in the hospital and recommended venue below to increase strength, balance, and endurance for safe ADL's.        Recommendations for follow up therapy are one component of a multi-disciplinary discharge planning process, led by the attending physician.  Recommendations may be updated based on patient status, additional functional criteria and insurance authorization.   Follow Up Recommendations  Skilled nursing-short term rehab (<3 hours/day)    Assistance Recommended at Discharge Intermittent Supervision/Assistance  Patient can return home with the following A lot of help with walking and/or transfers;A lot of help with bathing/dressing/bathroom;Assistance with cooking/housework;Assist for transportation;Help with stairs or ramp for entrance    Functional Status Assessment  Patient has had a recent decline in their functional status and demonstrates the ability to make significant improvements in function in a reasonable and predictable amount of time.  Equipment Recommendations  None recommended by OT    Recommendations for Other Services       Precautions / Restrictions Precautions Precautions: Fall Type of Shoulder Precautions: No ROM of shoulder. Cleared for AROM of hand, wrist, elbow, and forearm. Shoulder Interventions: Shoulder sling/immobilizer;At all times;Off for dressing/bathing/exercises Precaution Booklet Issued: Yes (comment) Precaution Comments: No ROM of shoulder Required Braces or Orthoses: Sling Restrictions Weight Bearing Restrictions: Yes LUE Weight Bearing: Non weight bearing      Mobility Bed Mobility Overal bed mobility: Modified Independent  Transfers Overall transfer level: Needs assistance Equipment used: None Transfers: Bed to chair/wheelchair/BSC, Sit to/from Stand Sit to Stand:  Min guard     Step pivot transfers: Min guard     General transfer comment: Pt milldy unteady in standing per one instance of alteral loss of balance with pt able to correct without assist.      Balance Overall balance assessment: Needs assistance Sitting-balance support: Feet supported, Bilateral upper extremity supported Sitting balance-Leahy Scale: Good Sitting balance - Comments: good seated EOB   Standing balance support: No upper extremity supported, During functional activity Standing balance-Leahy Scale: Fair Standing balance comment: fair without RW                           ADL either performed or assessed with clinical judgement   ADL Overall ADL's : Needs assistance/impaired Eating/Feeding: Modified independent;Set up;Sitting   Grooming: Supervision/safety;Min guard;Standing;Wash/dry hands Grooming Details (indicate cue type and reason): Completed standing at the sink without AD Upper Body Bathing: Minimal assistance;Sitting   Lower Body Bathing: Minimal assistance;Moderate assistance;Sitting/lateral leans   Upper Body Dressing : Minimal assistance;Sitting   Lower Body Dressing: Moderate assistance;Sitting/lateral leans Lower Body Dressing Details (indicate cue type and reason): Pt able to doff socks but requires assist to don in order to maintain NWB status with L UE. Toilet Transfer: Min guard;Ambulation Toilet Transfer Details (indicate cue type and reason): Ambulated to toilet and back from chair without AD. Min G assist. Toileting- Water quality scientist and Hygiene: Moderate assistance;Sitting/lateral lean Toileting - Clothing Manipulation Details (indicate cue type and reason): Pt able to attempt per care standing and sitting but lacked thoroughness needing this OT to assist while pt stood.     Functional mobility during ADLs: Min guard General ADL Comments: Pt able to ambualte about room without AD with min G assist. Once instance of lateral  loss of balance.     Vision   Vision Assessment?: No apparent visual deficits Additional Comments: Per observation during functional tasks.     Perception     Praxis      Pertinent Vitals/Pain Pain Assessment Pain Assessment: No/denies pain     Hand Dominance Right   Extremity/Trunk Assessment Upper Extremity Assessment Upper Extremity Assessment: LUE deficits/detail;Overall WFL for tasks assessed (R UE WFL) LUE Deficits / Details: L UE in sling. NWB   Lower Extremity Assessment Lower Extremity Assessment: Defer to PT evaluation   Cervical / Trunk Assessment Cervical / Trunk Assessment: Normal   Communication Communication Communication: No difficulties   Cognition Arousal/Alertness: Awake/alert Behavior During Therapy: WFL for tasks assessed/performed Overall Cognitive Status: Within Functional Limits for tasks assessed                                                  Shoulder Instructions Shoulder Instructions Correct positioning of sling/immobilizer:  (Sling adjusted by therapist to be more at 82* for elbow flexion.)    Home Living Family/patient expects to be discharged to:: Private residence Living Arrangements: Alone Available Help at Discharge: Family;Available PRN/intermittently Type of Home: House Home Access: Stairs to enter [employer] of Steps: 3 Entrance Stairs-Rails: Right;Left;Can reach both Home Layout: Able to live on main level with bedroom/bathroom;Laundry or work area in basement;Other (Comment) (Pt reporting he does not go to basement per PT note.)  Bathroom Shower/Tub: Occupational psychologist: Programmer, systems: Yes   Home Equipment: Shower seat;Hand held shower head          Prior Functioning/Environment Prior Level of Function : Independent/Modified Independent             Mobility Comments: Hydrographic surveyor without AD, drives prior to left shoulder surgery ADLs  Comments: Independent prior to left shoulder surgery, assisted by family since surgery (per PT note)        OT Problem List: Decreased range of motion;Decreased activity tolerance;Decreased knowledge of use of DME or AE;Decreased knowledge of precautions;Impaired UE functional use;Impaired balance (sitting and/or standing)      OT Treatment/Interventions: Self-care/ADL training;Therapeutic exercise;Therapeutic activities;Patient/family education;Balance training;DME and/or AE instruction    OT Goals(Current goals can be found in the care plan section) Acute Rehab OT Goals Patient Stated Goal: Open to rehab to get improve strength and safety. OT Goal Formulation: With patient Time For Goal Achievement: 03/31/22 Potential to Achieve Goals: Good  OT Frequency: Min 1X/week                                   End of Session Equipment Utilized During Treatment: Other (comment) (sling) Nurse Communication: Other (comment) (notified pt was in chair)  Activity Tolerance: Patient tolerated treatment well Patient left: in chair;with call bell/phone within reach  OT Visit Diagnosis: Unsteadiness on feet (R26.81);Other abnormalities of gait and mobility (R26.89);Muscle weakness (generalized) (M62.81);Pain                Time: 6962-9528 OT Time Calculation (min): 19 min Charges:  OT General Charges $OT Visit: 1 Visit OT Evaluation $OT Eval Low Complexity: 1 Low  Jermany Rimel OT, MOT  Larey Seat 03/17/2022, 10:10 AM

## 2022-03-17 NOTE — TOC Transition Note (Signed)
Transition of Care [facility]) - CM/SW Discharge Note   Patient Details  Name: Geoffrey Patterson MRN: [id number] Date of Birth: Apr 20, 1951  Transition of [facility]) CM/SW Contact:  Boneta Lucks, RN Phone Number: 03/17/2022, 3:41 PM   Clinical Narrative:   [name] received, [name] at [facility] updated they have a bed and ready, DC summary sent in the hub. They now require a COVID test with the last 24 hours, he was neg on 10/1. His sister is waiting at [facility]. RN/ MD updated.     Final next level of care: Skilled Nursing Facility Barriers to Discharge: Barriers Resolved   Patient Goals and CMS Choice Patient states their goals for this hospitalization and ongoing recovery are:: agreeable to SNF CMS Medicare.gov Compare Post Acute Care list provided to:: Patient Choice offered to / list presented to : Patient  Discharge Placement              Patient chooses bed at:  [facility]) Patient to be transferred to facility by: [facility] staff Name of family member notified: Sister is at [facility] waiting Patient and family notified of of transfer: 03/17/22  Discharge Plan and [place]                Readmission Risk Interventions    03/17/2022    3:40 PM  Readmission Risk Prevention Plan  Post Dischage Appt Complete  Medication Screening Complete  Transportation Screening Complete

## 2022-03-17 NOTE — Care Management Important Message (Signed)
Important Message  Patient Details  Name: Geoffrey Patterson MRN: [id number] Date of Birth: March 25, 1951   Medicare Important Message Given:  N/A - LOS <3 / Initial given by admissions     Tommy Medal 03/17/2022, 3:37 PM

## 2022-03-17 NOTE — Discharge Summary (Signed)
Physician Discharge Summary  Geoffrey Patterson IRW:[id number] DOB: 11-10-1950 DOA: 03/14/2022  PCP: Celene Squibb, MD  Admit date: 03/14/2022 Discharge date: 03/17/2022  Time spent: 35 minutes  Recommendations for Outpatient Follow-up:  Follow up with PCP and Dr Alyson Ingles in 1 weeks-call for appointment Please obtain BMP/CBC in one week Routine catheter care, voiding trial at urology follow-up SNF for short-term rehab Abnormal chest x-ray, needs repeat in 6 to 12 weeks  Discharge Diagnoses:  Principal Problem:   AKI (acute kidney injury) ([place])   Acute urinary retention Suspected bladder mass   Essential hypertension   P. Atrial fibrillation ([place])   Dyspnea   Discharge Condition: Improved  Diet recommendation: Low-sodium  Filed Weights   03/14/22 1239  Weight: 108.9 kg    History of present illness:  71 year old male with history of gout, hypertension,?  A-fib brought to the ED with weakness unable to get up and poor appetite following his left shoulder surgery on 9/24 for left shoulder fracture which he sustained after he fell while walking on 02/17/2022. In the ED vitals stable significant AKI with creatinine 76.1, metabolic acidosis EKG with A-fib, leukocytosis chest x-ray patchy retrocardiac airspace opacity in the left lower lobe atelectasis or pneumonia, Foley catheter was inserted immediately drained 2.9 L of pink-tinged urine   Hospital Course:   AKI  Metabolic acidosis 2/2 AKI Hyperkalemia 2/2 AKI: AKI most likely combination of postobstructive AKI along with prerenal with dehydration: AKI resolved with IV fluids and Foley insertion.  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  No chest pain   A-fib rate controlled on Cardizem not on anticoagulation, does not remember being on [facility]- has hemorrhoids and bleeding issues. Advised op fu with cards and pcp.  Dr.KC  discussed with pt and his siter. Cont Cardizem   Essential hypertension: Continues on medication   Class I Obesity:Patient's Body mass index is 32.55 kg/m. : Will benefit with PCP follow-up, weight loss  healthy lifestyle and outpatient sleep evaluation.   left shoulder surgery on 9/24 for left shoulder fracture which he sustained after he fell while walking on 02/17/2022.  Surgery/dressing placed.  Orthopedic surgeon was aware-they will follow-up as outpatient.    Consults: Orthopedic, urology, nephrology   Discharge Exam: Vitals:   03/16/22 1939 03/17/22 0447  BP: (!) 156/93 (!) 139/93  Pulse: 79 87  Resp: 18 12  Temp: 98.1 F (36.7 C) 98.4 F (36.9 C)  SpO2: 98% 97%   General: Pt is alert, awake, not in acute distress, cognitive deficits Cardiovascular: RRR, S1/S2 +, no rubs, no gallops Respiratory: CTA bilaterally, no wheezing, no rhonchi Abdominal: Soft, NT, ND, bowel sounds + Extremities: no edema, no cyanosis  Discharge Instructions    Allergies as of 03/17/2022   No Known Allergies  Medication List     STOP taking these medications    telmisartan 80 MG tablet Commonly known as: MICARDIS       TAKE these medications    allopurinol 300 MG tablet Commonly known as: ZYLOPRIM Take 300 mg by mouth daily.   Cardizem CD 120 MG 24 hr  capsule Generic drug: diltiazem Take 120 mg by mouth daily.   fexofenadine 180 MG tablet Commonly known as: ALLEGRA Take 180 mg by mouth daily.   ondansetron 4 MG tablet Commonly known as: Zofran Take 1 tablet (4 mg total) by mouth every 8 (eight) hours as needed for nausea or vomiting.   rosuvastatin 5 MG tablet Commonly known as: CRESTOR Take 5 mg by mouth daily.   sennosides-docusate sodium 8.6-50 MG tablet Commonly known as: SENOKOT-S Take 2 tablets by mouth daily.   tamsulosin 0.4 MG Caps capsule Commonly known as: FLOMAX Take 1 capsule (0.4 mg total) by mouth daily after supper.   traMADol 50 MG tablet Commonly known as: Ultram Take 1 tablet (50 mg total) by mouth every 4 (four) hours.   valACYclovir 1000 MG tablet Commonly known as: VALTREX Take 2,000 mg by mouth 2 (two) times daily as needed (fever blisters).       No Known Allergies  Contact information for follow-up providers     Celene Squibb, MD Follow up in 1 week(s).   Specialty: Internal Medicine Contact information: [place] [facility] [address] [phone]         Alyson Ingles Candee Furbish, MD Follow up in 1 week(s).   Specialty: Urology Contact information: [place] [phone]              Contact information for after-discharge care     [facility] .   Service: Skilled Nursing Contact information: [address] [place] [phone]                      The results of significant diagnostics from this hospitalization (including imaging, microbiology, ancillary and laboratory) are listed below for reference.    Significant Diagnostic Studies: DG Chest Port 1 View  Result Date: 03/16/2022 CLINICAL DATA:  Shortness of breath. EXAM: PORTABLE CHEST 1 VIEW COMPARISON:  Chest x-ray dated March 14, 2022 FINDINGS: The heart size and mediastinal  contours are within normal limits. Linear nodular opacity of the left lower lung, not seen on prior exam. Lungs otherwise clear. Prior reverse left total shoulder arthroplasty. IMPRESSION: Linear nodular opacity of the left lower lung, possibly new or not visualized due to differences in angulation. Differential considerations include pulmonary nodule or new focus of atelectasis. Recommend attention on follow-up, if nodular opacity does not resolve, CT would be indicated for further evaluation. Electronically Signed   By: Yetta Glassman M.D.   On: 03/16/2022 13:50   US Renal  Result Date: 03/15/2022 CLINICAL DATA:  Acute kidney injury. EXAM: RENAL / URINARY TRACT ULTRASOUND COMPLETE COMPARISON:  None Available. FINDINGS: Right Kidney: Renal measurements: 12.6 x 7.6 x 6.1 cm = volume: 305 mL. Echogenicity within normal limits. No mass or hydronephrosis visualized. Left Kidney: Renal measurements: 13.1 x 7.9 x 6.6 cm = volume: 358 mL. Echogenicity within normal limits. No mass or hydronephrosis visualized. Bladder: There appears to be a solid mass in the urinary bladder measuring 6.2 x 5.5 x 2.9 cm. Other: None. IMPRESSION: Kidneys are  unremarkable. However, there appears to be a solid mass in the urinary bladder measuring 6.2 x 5.5 x 2.9 cm. Further evaluation with CT scan or cystoscopy is recommended. These results will be called to the ordering clinician or representative by the Radiologist Assistant, and communication documented in the PACS or zVision Dashboard. Electronically Signed   By: Marijo Conception M.D.   On: 03/15/2022 13:17   DG Chest Port 1 View  Result Date: 03/14/2022 CLINICAL DATA:  Fatigue. EXAM: PORTABLE CHEST 1 VIEW COMPARISON:  None Available. FINDINGS: Mildly enlarged cardiac silhouette. Patchy retrocardiac airspace opacity in the left lower lobe with probable small left pleural effusion. Right lung is clear. No pneumothorax. Left total shoulder arthroplasty. No acute osseous  abnormality. IMPRESSION: Patchy retrocardiac airspace opacity in the left lower lobe with probable small left pleural effusion, which could reflect atelectasis versus pneumonia in the appropriate clinical context. Electronically Signed   By: Ileana Roup M.D.   On: 03/14/2022 14:22   DG Shoulder Left Port  Result Date: 03/07/2022 CLINICAL DATA:  Status post reverse shoulder arthroplasty. EXAM: LEFT SHOULDER COMPARISON:  February 18, 2022 FINDINGS: Post total left shoulder arthroplasty with normal alignment of the orthopedic hardware. Redemonstrated is left humeral neck comminuted fracture. IMPRESSION: 1. Post total left shoulder arthroplasty without evidence of immediate complications. 2. Left humeral neck comminuted fracture. Electronically Signed   By: Fidela Salisbury M.D.   On: 03/07/2022 14:27   CT SHOULDER LEFT WO CONTRAST  Result Date: 02/26/2022 CLINICAL DATA:  Fall on 02/15/2022.  Left proximal humeral fracture. EXAM: CT OF THE UPPER LEFT EXTREMITY WITHOUT CONTRAST TECHNIQUE: Multidetector CT imaging of the upper left extremity was performed according to the standard protocol. RADIATION DOSE REDUCTION: This exam was performed according to the departmental dose-optimization program which includes automated exposure control, adjustment of the mA and/or kV according to patient size and/or use of iterative reconstruction technique. COMPARISON:  Radiograph dated February 18, 2022 FINDINGS: Bones/Joint/Cartilage There is a comminuted impaction fracture of the left proximal humerus involving the femoral neck, head, greater and lesser tuberosities. There is approximately 2 cm superior displacement of the humeral neck/shaft. There is approximately 1.5 cm posterior displacement of the humeral head. The fracture extends into the articular surface of the humeral head. No appreciable fracture of the scapula. No appreciable rib fracture. Mild arthritic changes of the acromioclavicular joint and glenohumeral  joints. Ligaments Suboptimally assessed by CT. Muscles and Tendons Soft tissue swelling and edema about the comminuted fracture. Muscles and tendons appear intact. Soft tissues No significant hematoma or fluid collection. IMPRESSION: 1. Comminuted impaction fracture of the left proximal humerus involving femoral neck, head, greater and lesser tuberosities with 2 cm superior displacement of the humeral neck as well as 1.5 cm posterior displacement of the humeral head. The findings likely represent 3 part fracture (Neer classification). 2. Mild arthritic changes of the acromioclavicular and glenohumeral joints. 3.  Soft tissue swelling and edema about the comminuted fracture. Electronically Signed   By: Keane Police D.O.   On: 02/26/2022 17:58   DG Shoulder Left  Result Date: 02/18/2022 CLINICAL DATA:  LEFT shoulder pain, fell yesterday EXAM: LEFT SHOULDER - 2+ VIEW COMPARISON:  None FINDINGS: Osseous demineralization. AC joint alignment normal. Inferior acromial spur formation. Comminuted fracture involving the surgical neck and head of LEFT humerus, minimally displaced. No dislocation. Visualized ribs intact. IMPRESSION: Comminuted minimally displaced fracture involving the head and surgical neck of LEFT humerus. Electronically Signed   By: Crist Infante.D.  On: 02/18/2022 12:39    Microbiology: Recent Results (from the past 240 hour(s))  Resp Panel by RT-PCR (Flu A&B, Covid) Anterior Nasal Swab     Status: None   Collection Time: 03/14/22  1:39 PM   Specimen: Anterior Nasal Swab  Result Value Ref Range Status   SARS Coronavirus 2 by RT PCR NEGATIVE NEGATIVE Final    Comment: (NOTE) SARS-CoV-2 target nucleic acids are NOT DETECTED.  The SARS-CoV-2 RNA is generally detectable in upper respiratory specimens during the acute phase of infection. The lowest concentration of SARS-CoV-2 viral copies this assay can detect is 138 copies/mL. A negative result does not preclude SARS-Cov-2 infection and  should not be used as the sole basis for treatment or other patient management decisions. A negative result may occur with  improper specimen collection/handling, submission of specimen other than nasopharyngeal swab, presence of viral mutation(s) within the areas targeted by this assay, and inadequate number of viral copies(<138 copies/mL). A negative result must be combined with clinical observations, patient history, and epidemiological information. The expected result is Negative.  Fact Sheet for Patients:  EntrepreneurPulse.com.au  Fact Sheet for Healthcare Providers:  IncredibleEmployment.be  This test is no t yet approved or cleared by the [place] FDA and  has been authorized for detection and/or diagnosis of SARS-CoV-2 by FDA under an Emergency Use Authorization (EUA). This EUA will remain  in effect (meaning this test can be used) for the duration of the COVID-19 declaration under Section 564(b)(1) of the Act, 21 U.S.C.section 360bbb-3(b)(1), unless the authorization is terminated  or revoked sooner.       Influenza A by PCR NEGATIVE NEGATIVE Final   Influenza B by PCR NEGATIVE NEGATIVE Final    Comment: (NOTE) The Xpert Xpress SARS-CoV-2/FLU/RSV plus assay is intended as an aid in the diagnosis of influenza from Nasopharyngeal swab specimens and should not be used as a sole basis for treatment. Nasal washings and aspirates are unacceptable for Xpert Xpress SARS-CoV-2/FLU/RSV testing.  Fact Sheet for Patients: EntrepreneurPulse.com.au  Fact Sheet for Healthcare Providers: IncredibleEmployment.be  This test is not yet approved or cleared by the [place] FDA and has been authorized for detection and/or diagnosis of SARS-CoV-2 by FDA under an Emergency Use Authorization (EUA). This EUA will remain in effect (meaning this test can be used) for the duration of the COVID-19 declaration  under Section 564(b)(1) of the Act, 21 U.S.C. section 360bbb-3(b)(1), unless the authorization is terminated or revoked.  Performed at [facility], [address], [place], [place] [address]   Blood culture (routine x 2)     Status: None (Preliminary result)   Collection Time: 03/14/22  2:39 PM   Specimen: BLOOD  Result Value Ref Range Status   Specimen Description BLOOD RIGHT ANTECUBITAL  Final   Special Requests   Final    BOTTLES DRAWN AEROBIC AND ANAEROBIC Blood Culture adequate volume   Culture   Final    NO GROWTH 3 DAYS Performed at [facility], [address], [place], [place] [address]    Report Status PENDING  Incomplete  Blood culture (routine x 2)     Status: None (Preliminary result)   Collection Time: 03/14/22  2:39 PM   Specimen: BLOOD  Result Value Ref Range Status   Specimen Description BLOOD BLOOD RIGHT HAND  Final   Special Requests   Final    BOTTLES DRAWN AEROBIC AND ANAEROBIC Blood Culture adequate volume   Culture   Final    NO GROWTH 3  DAYS Performed at [facility], [address], [place], [place] [address]    Report Status PENDING  Incomplete  MRSA Next Gen by PCR, Nasal     Status: None   Collection Time: 03/14/22  4:07 PM   Specimen: Nasal Mucosa; Nasal Swab  Result Value Ref Range Status   MRSA by PCR Next Gen NOT DETECTED NOT DETECTED Final    Comment: (NOTE) The GeneXpert MRSA Assay (FDA approved for NASAL specimens only), is one component of a comprehensive MRSA colonization surveillance program. It is not intended to diagnose MRSA infection nor to guide or monitor treatment for MRSA infections. Test performance is not FDA approved in patients less than 38 years old. Performed at [facility], [address], [place], [place] [address]      Labs: Basic Metabolic Panel: Recent Labs  Lab 03/14/22 1330 03/15/22 0715 03/16/22 0431 03/17/22 0422  NA 131* 142 143 140  K 5.9* 4.5 4.6 4.3  CL 99 121* 117* 112*  CO2 13* 15* 18* 18*   GLUCOSE 116* 95 96 108*  BUN 160* 75* 38* 21  CREATININE 12.49* 3.37* 1.24 0.91  CALCIUM 8.0* 7.4* 8.1* 8.2*   Liver Function Tests: Recent Labs  Lab 03/14/22 1330  AST 42*  ALT 20  ALKPHOS 154*  BILITOT 1.0  PROT 5.9*  ALBUMIN 2.7*   No results for input(s): "LIPASE", "AMYLASE" in the last 168 hours. No results for input(s): "AMMONIA" in the last 168 hours. CBC: Recent Labs  Lab 03/14/22 1330 03/15/22 0714 03/16/22 0431 03/17/22 0422  WBC 14.1* 8.6 8.4 9.0  NEUTROABS 12.1*  --   --   --   HGB 12.2* 10.5* 10.6* 10.7*  HCT 34.2* 30.7* 31.7* 31.8*  MCV 93.2 93.3 97.5 95.8  PLT 165 137* 163 180   Cardiac Enzymes: Recent Labs  Lab 03/14/22 1801  CKTOTAL 49   BNP: BNP (last 3 results) Recent Labs    03/14/22 1330  BNP 79.0    ProBNP (last 3 results) No results for input(s): "PROBNP" in the last 8760 hours.  CBG: No results for input(s): "GLUCAP" in the last 168 hours.     Signed:  Domenic Polite MD.  [facility] 03/17/2022, 10:45 AM

## 2022-03-17 NOTE — Progress Notes (Signed)
Physical Therapy Treatment Patient Details Name: Geoffrey Patterson MRN: [id number] DOB: 1951-04-18 Today's Date: 03/17/2022   History of Present Illness Geoffrey Patterson is a 71 y.o. male with medical history significant for gout, hypertension, question atrial fibrillation.  Patient was brought to the ED reports of weakness, unable to get up, poor appetite.  Patient had left reverse shoulder arthroplasty surgery a week ago,- 9/24 by Dr. Mardelle Matte-  For left shoulder fractures that he sustained when he fell while walking 02/17/2022.  Since 2 days after the surgery, he has progressively gotten weak, with poor appetite, and unable to ambulate.  At baseline he ambulates without assistance or assistive devices.  He also reports difficulty breathing with exertion none at rest, no cough.  No chest pain.  Reports poor urine output over the past few days, but he denies any pain with urination, denies straining with urination or any other urinary symptoms or diagnosis before or after the surgery.  No fevers no chills.  His left shoulder has been in a sling since the surgery, he denies increasing pain, no discharge or discoloration from the left shoulder.  He has been taking only Tylenol and tramadol for pain since the surgery.  No NSAID use since surgery.  Patient's nephew Geoffrey Patterson is at bedside.    PT Comments    Patient presents up in chair (assisted by OT) and agreeable for therapy.  Patient demonstrates fair/good return for completing BLE ROM/strengthening exercises with occasional verbal cueing, increased endurance/distance for gait training with slightly unsteady labored cadence with fair carryover for using quad-cane with mostly step-to pattern without loss of balance and limited due to fatigue.  Patient requested to go back to bed after sitting up in chair since early AM.  Patient will benefit from continued skilled physical therapy in hospital and recommended venue below to increase strength, balance, endurance  for safe ADLs and gait.     Recommendations for follow up therapy are one component of a multi-disciplinary discharge planning process, led by the attending physician.  Recommendations may be updated based on patient status, additional functional criteria and insurance authorization.  Follow Up Recommendations  Skilled nursing-short term rehab (<3 hours/day) Can patient physically be transported by private vehicle: Yes   Assistance Recommended at Discharge Set up Supervision/Assistance  Patient can return home with the following A lot of help with walking and/or transfers;Help with stairs or ramp for entrance;A lot of help with bathing/dressing/bathroom;Assistance with Emergency planning/management officer    Recommendations for Other Services       Precautions / Restrictions Precautions Precautions: Fall Type of Shoulder Precautions: No ROM of shoulder. Cleared for AROM of hand, wrist, elbow, and forearm. Shoulder Interventions: Shoulder sling/immobilizer;At all times;Off for dressing/bathing/exercises Precaution Booklet Issued: Yes (comment) Precaution Comments: No ROM of shoulder Required Braces or Orthoses: Sling Restrictions Weight Bearing Restrictions: Yes LUE Weight Bearing: Non weight bearing     Mobility  Bed Mobility Overal bed mobility: Modified Independent Bed Mobility: Sit to Supine           General bed mobility comments: good return for getting back into bed    Transfers Overall transfer level: Needs assistance Equipment used: Quad cane Transfers: Sit to/from Stand, Bed to chair/wheelchair/BSC Sit to Stand: Min guard   Step pivot transfers: Min guard       General transfer comment: slow labored unsteady movement    Ambulation/Gait Ambulation/Gait assistance: Min guard, Min assist Gait Distance (Feet): 35 Feet Assistive device: [employer]  cane Gait Pattern/deviations: Decreased step length - right, Decreased step length - left,  Decreased stride length, Staggering left, Staggering right, Step-to pattern Gait velocity: decreased     General Gait Details: slow labored unsteaday cadence with occasional staggering left/right with mostly step-to pattern using quad cane and requires increased time for making turns   Marine scientist Rankin (Stroke Patients Only)       Balance Overall balance assessment: Needs assistance Sitting-balance support: Feet supported, No upper extremity supported Sitting balance-Leahy Scale: Good Sitting balance - Comments: seated at EOB   Standing balance support: During functional activity, Single extremity supported Standing balance-Leahy Scale: Fair Standing balance comment: using Quad-cane                            Cognition Arousal/Alertness: Awake/alert Behavior During Therapy: WFL for tasks assessed/performed Overall Cognitive Status: Within Functional Limits for tasks assessed                                          Exercises General Exercises - Lower Extremity Long Arc Quad: Seated, AROM, Strengthening, Both, 10 reps Hip Flexion/Marching: Seated, AROM, Strengthening, Both, 10 reps Toe Raises: Seated, AROM, Strengthening, Both, 10 reps Heel Raises: Seated, AROM, Strengthening, Both, 10 reps    General Comments        Pertinent Vitals/Pain Pain Assessment Pain Assessment: 0-10 Pain Score: 2  Pain Location: generalized pain all over Pain Descriptors / Indicators: Sore Pain Intervention(s): Limited activity within patient's tolerance, Monitored during session, Repositioned    Home Living                          Prior Function            PT Goals (current goals can now be found in the care plan section) Acute Rehab PT Goals Patient Stated Goal: return home after rehab PT Goal Formulation: With patient Time For Goal Achievement: 03/30/22 Potential to Achieve Goals:  Good Progress towards PT goals: Progressing toward goals    Frequency    Min 3X/week      PT Plan Current plan remains appropriate    Co-evaluation              AM-PAC PT "6 Clicks" Mobility   Outcome Measure  Help needed turning from your back to your side while in a flat bed without using bedrails?: None Help needed moving from lying on your back to sitting on the side of a flat bed without using bedrails?: A Little Help needed moving to and from a bed to a chair (including a wheelchair)?: A Little Help needed standing up from a chair using your arms (e.g., wheelchair or bedside chair)?: A Little Help needed to walk in hospital room?: A Lot Help needed climbing 3-5 steps with a railing? : A Lot 6 Click Score: 17    End of Session Equipment Utilized During Treatment: Gait belt Activity Tolerance: Patient tolerated treatment well;Patient limited by fatigue Patient left: in bed;with call bell/phone within reach Nurse Communication: Mobility status PT Visit Diagnosis: Unsteadiness on feet (R26.81);Other abnormalities of gait and mobility (R26.89);Muscle weakness (generalized) (M62.81)     Time: 3875-6433 PT Time Calculation (min) (ACUTE ONLY): 24 min  Charges:  $Gait Training: 8-22 mins $Therapeutic Exercise: 8-22 mins                     3:28 PM, 03/17/22 Lonell Grandchild, MPT Physical Therapist with [facility] [phone] office [phone] mobile phone

## 2022-03-17 NOTE — Plan of Care (Signed)
  Problem: Acute Rehab OT Goals (only OT should resolve) Goal: Pt. Will Perform Grooming Flowsheets (Taken 03/17/2022 1013) Pt Will Perform Grooming:  with modified independence  standing Goal: Pt. Will Perform Lower Body Bathing Flowsheets (Taken 03/17/2022 1013) Pt Will Perform Lower Body Bathing:  with modified independence  sitting/lateral leans  with adaptive equipment Goal: Pt. Will Perform Upper Body Dressing Flowsheets (Taken 03/17/2022 1013) Pt Will Perform Upper Body Dressing:  with modified independence  sitting Goal: Pt. Will Perform Lower Body Dressing Flowsheets (Taken 03/17/2022 1013) Pt Will Perform Lower Body Dressing:  with modified independence  sitting/lateral leans  with adaptive equipment Goal: Pt. Will Transfer To Toilet Flowsheets (Taken 03/17/2022 1013) Pt Will Transfer to Toilet:  with modified independence  ambulating  Independently Goal: Pt. Will Perform Toileting-Clothing Manipulation Flowsheets (Taken 03/17/2022 1013) Pt Will Perform Toileting - Clothing Manipulation and hygiene:  with modified independence  sitting/lateral leans  sit to/from stand  [employer] OT, MOT

## 2022-03-17 NOTE — Evaluation (Signed)
Clinical/Bedside Swallow Evaluation Patient Details  Name: Geoffrey Patterson MRN: [id number] Date of Birth: 05-04-1951  Today's Date: 03/17/2022 Time: SLP Start Time (ACUTE ONLY): 2423 SLP Stop Time (ACUTE ONLY): 1401 SLP Time Calculation (min) (ACUTE ONLY): 26 min  Past Medical History:  Past Medical History:  Diagnosis Date   Dysrhythmia    a-fib history   Gout    Hypertension    Past Surgical History:  Past Surgical History:  Procedure Laterality Date   COLONOSCOPY     COLONOSCOPY N/A 12/30/2016   Procedure: COLONOSCOPY;  Surgeon: Rogene Houston, MD;  [facility];  Service: Endoscopy;  Laterality: N/A;  1030   POLYPECTOMY  12/30/2016   Procedure: POLYPECTOMY;  Surgeon: Rogene Houston, MD;  [facility];  Service: Endoscopy;;  colon    REVERSE SHOULDER ARTHROPLASTY Left 03/07/2022   Procedure: REVERSE SHOULDER ARTHROPLASTY;  Surgeon: Marchia Bond, MD;  Location: [facility];  Service: Orthopedics;  Laterality: Left;   TONSILLECTOMY     HPI:  Geoffrey Patterson is a 71 y.o. male with medical history significant for gout, hypertension, question atrial fibrillation.  Patient was brought to the ED reports of weakness, unable to get up, poor appetite.  Patient had left shoulder surgery a week ago,- 9/24 by Dr. Mardelle Matte-  For left shoulder fractures that he sustained when he fell while walking 02/17/2022.  Since 2 days after the surgery, he has progressively gotten weak, with poor appetite, and unable to ambulate.  At baseline he ambulates without assistance or assistive devices. Chest x-ray shows-patchy retrocardiac airspace opacity in the left lower lobe atelectasis or pneumonia.   Assessment / Plan / Recommendation  Clinical Impression  Clinical swallowing evaluation completed while Pt was sitting upright in the chair. Pt was cooperative and pleasant; note baseline hoarse vocal quality and a stutter. Pt reports he has stuttered for the past "several years" and is  unable to tell me exactly when it started or anything that may have caused it. Pt reports his ears currently feel "stopped up" and his throat is slightly sore. Despite the above details, Pt consumed thin liquids, regular and mechanical soft textures without overt s/sx of oropharyngeal dysphagia. Recommend continue with a regular diet and thin liquids; recommend meds to be administered whole with liquids. Recommend consider ENT consult in order to visualize vocal cords (secondary to hoarse/gravely quality) and to assess ear discomfort. Pt reports he has never been evaluated or treated by an SLP for stuttering; Pt would likely benefit from ST evaluation and treatment as indicated for speech. There are no further needs for dysphagia and ST will sign off at this time; please order SLE if indcated in acute or can be addressed at next venue. Thank you for this referral, SLP Visit Diagnosis: Dysphagia, unspecified (R13.10)    Aspiration Risk       Diet Recommendation Regular;Thin liquid   Liquid Administration via: Straw Medication Administration: Whole meds with liquid Supervision: Patient able to self feed Compensations: Slow rate;Small sips/bites Postural Changes: Seated upright at 90 degrees    Other  Recommendations Recommended Consults: Consider ENT evaluation Oral Care Recommendations: Oral care BID    Recommendations for follow up therapy are one component of a multi-disciplinary discharge planning process, led by the attending physician.  Recommendations may be updated based on patient status, additional functional criteria and insurance authorization.    Swallow Study   General HPI: Geoffrey Patterson is a 71 y.o. male with medical history significant  for gout, hypertension, question atrial fibrillation.  Patient was brought to the ED reports of weakness, unable to get up, poor appetite.  Patient had left shoulder surgery a week ago,- 9/24 by Dr. Mardelle Matte-  For left shoulder fractures that he  sustained when he fell while walking 02/17/2022.  Since 2 days after the surgery, he has progressively gotten weak, with poor appetite, and unable to ambulate.  At baseline he ambulates without assistance or assistive devices. Type of Study: Bedside Swallow Evaluation Previous Swallow Assessment: none in chart Diet Prior to this Study: Regular;Thin liquids Temperature Spikes Noted: No Respiratory Status: Room air History of Recent Intubation: No Behavior/Cognition: Alert;Cooperative;Pleasant mood Oral Cavity Assessment: Within Functional Limits Oral Care Completed by SLP: Recent completion by staff Oral Cavity - Dentition: Adequate natural dentition Vision: Functional for self-feeding Self-Feeding Abilities: Able to feed self Patient Positioning: Upright in chair Baseline Vocal Quality: Normal Volitional Cough:  (hoarse/gravely quality) Volitional Swallow: Able to elicit    Oral/Motor/Sensory Function     Ice Chips Ice chips: Within functional limits   Thin Liquid Thin Liquid: Within functional limits    Nectar Thick Nectar Thick Liquid: Not tested   Honey Thick Honey Thick Liquid: Not tested   Puree Puree: Within functional limits   Solid     Solid: Within functional limits     Dusty Raczkowski H. Roddie Mc, CCC-SLP Speech Language Pathologist  Wende Bushy 03/17/2022,2:06 PM

## 2022-03-18 DIAGNOSIS — N179 Acute kidney failure, unspecified: Secondary | ICD-10-CM | POA: Diagnosis not present

## 2022-03-18 LAB — BASIC METABOLIC PANEL
Anion gap: 8 (ref 5–15)
BUN: 17 mg/dL (ref 8–23)
CO2: 20 mmol/L — ABNORMAL LOW (ref 22–32)
Calcium: 8.2 mg/dL — ABNORMAL LOW (ref 8.9–10.3)
Chloride: 111 mmol/L (ref 98–111)
Creatinine, Ser: 0.8 mg/dL (ref 0.61–1.24)
GFR, Estimated: >60 mL/min (ref >60)
Glucose, Bld: 98 mg/dL (ref 70–99)
Potassium: 3.8 mmol/L (ref 3.5–5.1)
Sodium: 139 mmol/L (ref 135–145)

## 2022-03-18 LAB — CBC
HCT: 31.8 % — ABNORMAL LOW (ref 39.0–52.0)
Hemoglobin: 10.7 g/dL — ABNORMAL LOW (ref 13.0–17.0)
MCH: 32.6 pg (ref 26.0–34.0)
MCHC: 33.6 g/dL (ref 30.0–36.0)
MCV: 97 fL (ref 80.0–100.0)
Platelets: 178 10*3/uL (ref 150–400)
RBC: 3.28 MIL/uL — ABNORMAL LOW (ref 4.22–5.81)
RDW: 12.2 % (ref 11.5–15.5)
WBC: 9.3 10*3/uL (ref 4.0–10.5)
nRBC: 0 % (ref 0.0–0.2)

## 2022-03-18 MED ORDER — MOLNUPIRAVIR EUA 200MG CAPSULE
4.0000 | ORAL_CAPSULE | Freq: Two times a day (BID) | ORAL | Status: DC
  Administered 2022-03-18 – 2022-03-22 (×9): 800 mg via ORAL
  Filled 2022-03-18: qty 4

## 2022-03-18 NOTE — Progress Notes (Signed)
PROGRESS NOTE    Geoffrey Patterson  GYJ:[id number] DOB: 30-Sep-1950 DOA: 03/14/2022 PCP: Celene Squibb, MD  71 year old male with history of gout, hypertension,?  A-fib brought to the ED with weakness unable to get up and poor appetite following his left shoulder surgery on 9/24 for left shoulder fracture which he sustained after he fell while walking on 02/17/2022. In the ED vitals stable significant AKI with creatinine 26.3, metabolic acidosis EKG with A-fib, leukocytosis chest x-ray patchy retrocardiac airspace opacity in the left lower lobe atelectasis or pneumonia, Foley catheter was inserted immediately drained 2.9 L of pink-tinged urine. -AKI resolved with Foley, seen by urology, there was concern for possible bladder mass, urology recommended discharge with Foley catheter and outpatient follow-up for voiding trial and cystoscopy. -Patient was discharged to SNF on 10/4 however COVID for discharge resulted positive, patient remained asymptomatic, discharge postponed per SNF until he completes 5 days of isolation  Subjective: Feels okay, denies any complaints, denies any cough congestion or shortness of breath, ate all his breakfast, reports his stuttering is chronic for decades  Assessment and Plan:  AKI  Metabolic acidosis 2/2 AKI Hyperkalemia 2/2 AKI: -Secondary to postobstructive urinary retention, BPH -AKI resolved with IV fluids and Foley insertion.  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  No chest pain   A-fib rate controlled on Cardizem not on anticoagulation, does not remember being on [facility]- has hemorrhoids and bleeding issues. Advised op fu with cards and pcp.  Dr.KC  discussed with pt and his siter. Cont Cardizem    Essential hypertension: Continues on medication   Class I Obesity:Patient's Body mass index is 32.55 kg/m. : Will benefit with PCP follow-up, weight loss  healthy lifestyle and outpatient sleep evaluation.   left shoulder surgery on 9/24 for left shoulder fracture which he sustained after he fell while walking on 02/17/2022.  Surgery/dressing placed.  Orthopedic surgeon was aware-they will follow-up as outpatient.    Consults: Orthopedic, urology, nephrology   DVT prophylaxis: Heparin subcutaneous Code Status: Full code Family Communication: Discussed patient detail, no family at bedside Disposition Plan: SNF pending completion of COVID isolation  Procedures:   Antimicrobials:    Objective: Vitals:   03/17/22 0447 03/17/22 1727 03/17/22 1953 03/18/22 0324  BP: (!) 139/93 (!) 147/84 (!) 147/89 139/84  Pulse: 87 86 72 88  Resp: '12 20 19 20  '$ Temp: 98.4 F (36.9 C) 98.6 F (37 C) 98.5 F (36.9 C) (!) 97.2 F (36.2 C)  TempSrc:  Oral    SpO2: 97% 98% 97% 98%  Weight:      Height:        Intake/Output Summary (Last 24 hours) at 03/18/2022 1137 Last data filed at 03/18/2022 1039 Gross per 24 hour  Intake 480 ml  Output 4700 ml  Net -4220 ml   Filed Weights   03/14/22 1239  Weight: 108.9  kg    Examination:  General exam: Appears calm and comfortable, AAOx3, stuttering speech Respiratory system: Clear to auscultation Cardiovascular system: S1 & S2 heard, RRR.  Abd: nondistended, soft and nontender.Normal bowel sounds heard. GU: Foley catheter Extremities: no edema Skin: No rashes Psychiatry:  Mood &  affect appropriate.     Data Reviewed:   CBC: Recent Labs  Lab 03/14/22 1330 03/15/22 0714 03/16/22 0431 03/17/22 0422 03/18/22 0503  WBC 14.1* 8.6 8.4 9.0 9.3  NEUTROABS 12.1*  --   --   --   --   HGB 12.2* 10.5* 10.6* 10.7* 10.7*  HCT 34.2* 30.7* 31.7* 31.8* 31.8*  MCV 93.2 93.3 97.5 95.8 97.0  PLT 165 137* 163 180 381   Basic Metabolic Panel: Recent Labs  Lab 03/14/22 1330 03/15/22 0715 03/16/22 0431 03/17/22 0422 03/18/22 0503  NA 131* 142 143 140 139  K 5.9* 4.5 4.6 4.3 3.8  CL 99 121* 117* 112* 111  CO2 13* 15* 18* 18* 20*  GLUCOSE 116* 95 96 108* 98  BUN 160* 75* 38* 21 17  CREATININE 12.49* 3.37* 1.24 0.91 0.80  CALCIUM 8.0* 7.4* 8.1* 8.2* 8.2*   GFR: Estimated Creatinine Clearance: 104.8 mL/min (by C-G formula based on SCr of 0.8 mg/dL). Liver Function Tests: Recent Labs  Lab 03/14/22 1330  AST 42*  ALT 20  ALKPHOS 154*  BILITOT 1.0  PROT 5.9*  ALBUMIN 2.7*   No results for input(s): "LIPASE", "AMYLASE" in the last 168 hours. No results for input(s): "AMMONIA" in the last 168 hours. Coagulation Profile: No results for input(s): "INR", "PROTIME" in the last 168 hours. Cardiac Enzymes: Recent Labs  Lab 03/14/22 1801  CKTOTAL 49   BNP (last 3 results) No results for input(s): "PROBNP" in the last 8760 hours. HbA1C: No results for input(s): "HGBA1C" in the last 72 hours. CBG: No results for input(s): "GLUCAP" in the last 168 hours. Lipid Profile: No results for input(s): "CHOL", "HDL", "LDLCALC", "TRIG", "CHOLHDL", "LDLDIRECT" in the last 72 hours. Thyroid Function Tests: No results for input(s): "TSH", "T4TOTAL", "FREET4", "T3FREE", "THYROIDAB" in the last 72 hours. Anemia Panel: No results for input(s): "VITAMINB12", "FOLATE", "FERRITIN", "TIBC", "IRON", "RETICCTPCT" in the last 72 hours. Urine analysis:    Component Value Date/Time   COLORURINE AMBER (A) 03/14/2022 1309   APPEARANCEUR HAZY (A) 03/14/2022 1309   LABSPEC 1.010  03/14/2022 1309   PHURINE 6.0 03/14/2022 1309   GLUCOSEU NEGATIVE 03/14/2022 1309   HGBUR LARGE (A) 03/14/2022 1309   BILIRUBINUR NEGATIVE 03/14/2022 1309   KETONESUR NEGATIVE 03/14/2022 1309   PROTEINUR 100 (A) 03/14/2022 1309   NITRITE NEGATIVE 03/14/2022 1309   LEUKOCYTESUR LARGE (A) 03/14/2022 1309   Sepsis Labs: '@LABRCNTIP'$ (procalcitonin:4,lacticidven:4)  ) Recent Results (from the past 240 hour(s))  Resp Panel by RT-PCR (Flu A&B, Covid) Anterior Nasal Swab     Status: None   Collection Time: 03/14/22  1:39 PM   Specimen: Anterior Nasal Swab  Result Value Ref Range Status   SARS Coronavirus 2 by RT PCR NEGATIVE NEGATIVE Final    Comment: (NOTE) SARS-CoV-2 target nucleic acids are NOT DETECTED.  The SARS-CoV-2 RNA is generally detectable in upper respiratory specimens during the acute phase of infection. The lowest concentration of SARS-CoV-2 viral copies this assay can detect is 138 copies/mL. A negative result does not preclude SARS-Cov-2 infection and should not be used as the sole basis for treatment or other patient management decisions. A negative result may occur with  improper specimen collection/handling, submission  of specimen other than nasopharyngeal swab, presence of viral mutation(s) within the areas targeted by this assay, and inadequate number of viral copies(<138 copies/mL). A negative result must be combined with clinical observations, patient history, and epidemiological information. The expected result is Negative.  Fact Sheet for Patients:  EntrepreneurPulse.com.au  Fact Sheet for Healthcare Providers:  IncredibleEmployment.be  This test is no t yet approved or cleared by the [place] FDA and  has been authorized for detection and/or diagnosis of SARS-CoV-2 by FDA under an Emergency Use Authorization (EUA). This EUA will remain  in effect (meaning this test can be used) for the duration of the COVID-19  declaration under Section 564(b)(1) of the Act, 21 U.S.C.section 360bbb-3(b)(1), unless the authorization is terminated  or revoked sooner.       Influenza A by PCR NEGATIVE NEGATIVE Final   Influenza B by PCR NEGATIVE NEGATIVE Final    Comment: (NOTE) The Xpert Xpress SARS-CoV-2/FLU/RSV plus assay is intended as an aid in the diagnosis of influenza from Nasopharyngeal swab specimens and should not be used as a sole basis for treatment. Nasal washings and aspirates are unacceptable for Xpert Xpress SARS-CoV-2/FLU/RSV testing.  Fact Sheet for Patients: EntrepreneurPulse.com.au  Fact Sheet for Healthcare Providers: IncredibleEmployment.be  This test is not yet approved or cleared by the [place] FDA and has been authorized for detection and/or diagnosis of SARS-CoV-2 by FDA under an Emergency Use Authorization (EUA). This EUA will remain in effect (meaning this test can be used) for the duration of the COVID-19 declaration under Section 564(b)(1) of the Act, 21 U.S.C. section 360bbb-3(b)(1), unless the authorization is terminated or revoked.  Performed at [facility], [address] [place], [place] [address]   Blood culture (routine x 2)     Status: None (Preliminary result)   Collection Time: 03/14/22  2:39 PM   Specimen: BLOOD  Result Value Ref Range Status   Specimen Description BLOOD RIGHT ANTECUBITAL  Final   Special Requests   Final    BOTTLES DRAWN AEROBIC AND ANAEROBIC Blood Culture adequate volume   Culture   Final    NO GROWTH 4 DAYS Performed at [facility], [address] [place], [place] [address]    Report Status PENDING  Incomplete  Blood culture (routine x 2)     Status: None (Preliminary result)   Collection Time: 03/14/22  2:39 PM   Specimen: BLOOD  Result Value Ref Range Status   Specimen Description BLOOD BLOOD RIGHT HAND  Final   Special Requests   Final    BOTTLES DRAWN AEROBIC AND ANAEROBIC Blood Culture  adequate volume   Culture   Final    NO GROWTH 4 DAYS Performed at [facility], [address], [place], [place] [address]    Report Status PENDING  Incomplete  MRSA Next Gen by PCR, Nasal     Status: None   Collection Time: 03/14/22  4:07 PM   Specimen: Nasal Mucosa; Nasal Swab  Result Value Ref Range Status   MRSA by PCR Next Gen NOT DETECTED NOT DETECTED Final    Comment: (NOTE) The GeneXpert MRSA Assay (FDA approved for NASAL specimens only), is one component of a comprehensive MRSA colonization surveillance program. It is not intended to diagnose MRSA infection nor to guide or monitor treatment for MRSA infections. Test performance is not FDA approved in patients less than 17 years old. Performed at [facility], [address] [place], [place] [address]   SARS Coronavirus 2 by RT PCR (  hospital order, performed in [facility] hospital lab) *cepheid single result test* Anterior Nasal Swab     Status: Abnormal   Collection Time: 03/17/22  3:46 PM   Specimen: Anterior Nasal Swab  Result Value Ref Range Status   SARS Coronavirus 2 by RT PCR POSITIVE (A) NEGATIVE Final    Comment: (NOTE) SARS-CoV-2 target nucleic acids are DETECTED  SARS-CoV-2 RNA is generally detectable in upper respiratory specimens  during the acute phase of infection.  Positive results are indicative  of the presence of the identified virus, but do not rule out bacterial infection or co-infection with other pathogens not detected by the test.  Clinical correlation with patient history and  other diagnostic information is necessary to determine patient infection status.  The expected result is negative.  Fact Sheet for Patients:   https://www.patel.info/   Fact Sheet for Healthcare Providers:   https://hall.com/    This test is not yet approved or cleared by the [place] FDA and  has been authorized for detection and/or diagnosis of SARS-CoV-2 by FDA under  an Emergency Use Authorization (EUA).  This EUA will remain in effect (meaning this test can be used) for the duration of  the COVID-19 declaration under Section 564(b)(1)  of the Act, 21 U.S.C. section 360-bbb-3(b)(1), unless the authorization is terminated or revoked sooner.   Performed at [facility], [address], [place], [place] [address]      Radiology Studies: No results found.   Scheduled Meds:  Chlorhexidine Gluconate Cloth  6 each Topical Q0600   diltiazem  120 mg Oral Daily   heparin  5,000 Units Subcutaneous Q8H   hydrocortisone   Rectal BID   molnupiravir EUA  4 capsule Oral BID   tamsulosin  0.4 mg Oral QPC supper   Continuous Infusions:   LOS: 4 days    Time spent: 41mn    PDomenic Polite MD [facility]   03/18/2022, 11:37 AM

## 2022-03-18 NOTE — Progress Notes (Signed)
Called by family member [name] to let nursing staff know plan for surgical dressing. I discussed with [place], we will plan to change his surgical mepilex dressing to a new mepilex today.  Merlene Pulling, PA-C

## 2022-03-18 NOTE — TOC Progression Note (Signed)
Transition of Care [facility]) - Progression Note    Patient Details  Name: Geoffrey Patterson MRN: [id number] Date of Birth: [birth year]  Transition of Care [facility]) CM/SW Contact  Boneta Lucks, RN Phone Number: 03/18/2022, 9:42 AM  Clinical Narrative:     [facility] requested COVID test. Patient tested positive. Per [name] he needs to quarantine for 5 days. They will take him on Monday. Insurance authorization expires 10/10.   Expected Discharge Plan: Skilled Nursing Facility Barriers to Discharge: Barriers Resolved  Expected Discharge Plan and Services Expected Discharge Plan: [place] arrangements for the past 2 months: [place]

## 2022-03-19 DIAGNOSIS — N179 Acute kidney failure, unspecified: Secondary | ICD-10-CM | POA: Diagnosis not present

## 2022-03-19 LAB — CBC
HCT: 32 % — ABNORMAL LOW (ref 39.0–52.0)
Hemoglobin: 11 g/dL — ABNORMAL LOW (ref 13.0–17.0)
MCH: 32.6 pg (ref 26.0–34.0)
MCHC: 34.4 g/dL (ref 30.0–36.0)
MCV: 95 fL (ref 80.0–100.0)
Platelets: 180 10*3/uL (ref 150–400)
RBC: 3.37 MIL/uL — ABNORMAL LOW (ref 4.22–5.81)
RDW: 12.6 % (ref 11.5–15.5)
WBC: 9.7 10*3/uL (ref 4.0–10.5)
nRBC: 0 % (ref 0.0–0.2)

## 2022-03-19 LAB — CULTURE, BLOOD (ROUTINE X 2)
Culture: NO GROWTH
Culture: NO GROWTH
Special Requests: ADEQUATE
Special Requests: ADEQUATE

## 2022-03-19 NOTE — Progress Notes (Signed)
PROGRESS NOTE  Geoffrey Patterson QQP:[id number] DOB: 1951/01/23 DOA: 03/14/2022 PCP: Celene Squibb, MD   LOS: 5 days   Brief Narrative / Interim history: 71 year old male with history of gout, hypertension,?  A-fib brought to the ED with weakness unable to get up and poor appetite following his left shoulder surgery on 9/24 for left shoulder fracture which he sustained after he fell while walking on 02/17/2022. In the ED vitals stable significant AKI with creatinine 71.2, metabolic acidosis EKG with A-fib, leukocytosis chest x-ray patchy retrocardiac airspace opacity in the left lower lobe atelectasis or pneumonia, Foley catheter was inserted immediately drained 2.9 L of pink-tinged urine.  Urology consulted, there is concern for bladder mass, recommending discharge with Foley catheter and outpatient follow-up for voiding trial and cystoscopy.  He was slated for discharge to SNF on 10/4 however COVID resulted positive, delaying his discharge until Monday.  Subjective / 24h Interval events: Feels well, no complaints.  Assesement and Plan: Principal Problem:   AKI (acute kidney injury) ([employer]) Active Problems:   Acute urinary retention   Essential hypertension   Atrial fibrillation (HCC)   Dyspnea   Principal problem AKI  Metabolic acidosis 2/2 AKI Hyperkalemia 2/2 AKI-Secondary to postobstructive urinary retention, BPH. AKI resolved with IV fluids and Foley insertion. ARB discontinued, off IV fluids, kidney function has normalized -He was seen by urology, recommending Foley catheter at discharge.  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  Asymptomatic.  He was started on molnupiravir for total of 5 days    Mildly elevated troponin -flat suspect  in the setting of severe AKI/demand ischemia.  No chest pain   A-fib - rate controlled on Cardizem not on anticoagulation, does not remember being on [facility]- has hemorrhoids and bleeding issues. Advised op fu with cards and pcp.    Essential hypertension-Continues on medication   Class I Obesity-Patient's Body mass index is 32.55 kg/m. He would benefit from weight loss   Left shoulder surgery on 9/24 -for left shoulder fracture which he sustained after he fell while walking on 02/17/2022.  Surgery/dressing placed.  Orthopedic surgeon was aware-they will follow-up as outpatient.    Scheduled Meds:  Chlorhexidine Gluconate Cloth  6 each Topical Q0600   diltiazem  120 mg Oral Daily   heparin  5,000 Units Subcutaneous Q8H   hydrocortisone   Rectal BID   molnupiravir EUA  4 capsule Oral BID   tamsulosin  0.4 mg Oral QPC supper   Continuous Infusions: PRN Meds:.acetaminophen **OR** acetaminophen, hydrALAZINE, ondansetron **OR** ondansetron (ZOFRAN) IV, polyethylene glycol  Current Outpatient Medications  Medication Instructions   allopurinol (ZYLOPRIM) 300 mg, Oral, Daily   diltiazem (CARDIZEM CD) 120 mg, Oral, Daily   fexofenadine (ALLEGRA) 180 mg, Oral, Daily   ondansetron (ZOFRAN) 4 mg, Oral, Every 8 hours PRN   rosuvastatin (CRESTOR) 5 mg, Oral, Daily   sennosides-docusate sodium (SENOKOT-S) 8.6-50 MG tablet 2 tablets, Oral, Daily   tamsulosin (FLOMAX) 0.4 mg, Oral, Daily after supper   telmisartan (MICARDIS) 80 mg, Oral, Daily   traMADol (ULTRAM) 50 mg, Oral, Every 4 hours   valACYclovir (VALTREX) 2,000 mg, Oral, 2 times daily PRN    Diet Orders (From admission, onward)     Start  Ordered   03/14/22 1713  Diet Heart Room service appropriate? Yes; Fluid consistency: Thin  Diet effective now       Question Answer Comment  Room service appropriate? Yes   Fluid consistency: Thin      03/14/22 1712            DVT prophylaxis: heparin injection 5,000 Units Start: 03/14/22  2200   Lab Results  Component Value Date   PLT 180 03/19/2022      Code Status: Full Code  Family Communication: no family at bedside  Status is: Inpatient Remains inpatient appropriate because: Awaiting COVID isolation.   Level of care: Med-Surg  Consultants:  Urology   Objective: Vitals:   03/17/22 1953 03/18/22 0324 03/18/22 1434 03/19/22 0543  BP: (!) 147/89 139/84 (!) 148/83 (!) 135/96  Pulse: 72 88 80 84  Resp: '19 20 18 19  '$ Temp: 98.5 F (36.9 C) (!) 97.2 F (36.2 C) 97.7 F (36.5 C) 97.9 F (36.6 C)  TempSrc:   Oral   SpO2: 97% 98% 99% 97%  Weight:      Height:        Intake/Output Summary (Last 24 hours) at 03/19/2022 1121 Last data filed at 03/19/2022 1042 Gross per 24 hour  Intake 720 ml  Output 2950 ml  Net -2230 ml   Wt Readings from Last 3 Encounters:  03/14/22 108.9 kg  03/07/22 108.9 kg  01/25/17 114.9 kg    Examination:  Constitutional: NAD Eyes: no scleral icterus ENMT: Mucous membranes are moist.  Neck: normal, supple Respiratory: clear to auscultation bilaterally, no wheezing, no crackles. Normal respiratory effort. No accessory muscle use.  Cardiovascular: Regular rate and rhythm, no murmurs / rubs / gallops. No LE edema.  Abdomen: non distended, no tenderness. Bowel sounds positive.  Musculoskeletal: no clubbing / cyanosis.  Skin: no rashes Neurologic: non focal   Data Reviewed: I have independently reviewed following labs and imaging studies   CBC Recent Labs  Lab 03/14/22 1330 03/15/22 0714 03/16/22 0431 03/17/22 0422 03/18/22 0503 03/19/22 0520  WBC 14.1* 8.6 8.4 9.0 9.3 9.7  HGB 12.2* 10.5* 10.6* 10.7* 10.7* 11.0*  HCT 34.2* 30.7* 31.7* 31.8* 31.8* 32.0*  PLT 165 137* 163 180 178 180  MCV 93.2 93.3 97.5 95.8 97.0 95.0  MCH 33.2 31.9 32.6 32.2 32.6 32.6  MCHC 35.7 34.2 33.4 33.6 33.6 34.4  RDW 12.4 12.3 12.4 12.2 12.2 12.6  LYMPHSABS 0.6*  --   --   --   --   --   MONOABS 1.0  --   --   --   --   --   EOSABS  0.2  --   --   --   --   --   BASOSABS 0.0  --   --   --   --   --     Recent Labs  Lab 03/14/22 1330 03/14/22 1439 03/14/22 1801 03/15/22 0715 03/16/22 0431 03/17/22 0422 03/18/22 0503  NA 131*  --   --  142 143 140 139  K 5.9*  --   --  4.5 4.6 4.3 3.8  CL 99  --   --  121* 117* 112* 111  CO2 13*  --   --  15* 18* 18* 20*  GLUCOSE 116*  --   --  95 96 108* 98  BUN 160*  --   --  75* 38* 21 17  CREATININE 12.49*  --   --  3.37* 1.24 0.91  0.80  CALCIUM 8.0*  --   --  7.4* 8.1* 8.2* 8.2*  AST 42*  --   --   --   --   --   --   ALT 20  --   --   --   --   --   --   ALKPHOS 154*  --   --   --   --   --   --   BILITOT 1.0  --   --   --   --   --   --   ALBUMIN 2.7*  --   --   --   --   --   --   PROCALCITON  --   --  0.36  --   --   --   --   LATICACIDVEN  --  1.1 1.6  --   --   --   --   BNP 79.0  --   --   --   --   --   --     ------------------------------------------------------------------------------------------------------------------ No results for input(s): "CHOL", "HDL", "LDLCALC", "TRIG", "CHOLHDL", "LDLDIRECT" in the last 72 hours.  No results found for: "HGBA1C" ------------------------------------------------------------------------------------------------------------------ No results for input(s): "TSH", "T4TOTAL", "T3FREE", "THYROIDAB" in the last 72 hours.  Invalid input(s): "FREET3"  Cardiac Enzymes No results for input(s): "CKMB", "TROPONINI", "MYOGLOBIN" in the last 168 hours.  Invalid input(s): "CK" ------------------------------------------------------------------------------------------------------------------    Component Value Date/Time   BNP 79.0 03/14/2022 1330    CBG: No results for input(s): "GLUCAP" in the last 168 hours.  Recent Results (from the past 240 hour(s))  Resp Panel by RT-PCR (Flu A&B, Covid) Anterior Nasal Swab     Status: None   Collection Time: 03/14/22  1:39 PM   Specimen: Anterior Nasal Swab  Result Value Ref Range  Status   SARS Coronavirus 2 by RT PCR NEGATIVE NEGATIVE Final    Comment: (NOTE) SARS-CoV-2 target nucleic acids are NOT DETECTED.  The SARS-CoV-2 RNA is generally detectable in upper respiratory specimens during the acute phase of infection. The lowest concentration of SARS-CoV-2 viral copies this assay can detect is 138 copies/mL. A negative result does not preclude SARS-Cov-2 infection and should not be used as the sole basis for treatment or other patient management decisions. A negative result may occur with  improper specimen collection/handling, submission of specimen other than nasopharyngeal swab, presence of viral mutation(s) within the areas targeted by this assay, and inadequate number of viral copies(<138 copies/mL). A negative result must be combined with clinical observations, patient history, and epidemiological information. The expected result is Negative.  Fact Sheet for Patients:  EntrepreneurPulse.com.au  Fact Sheet for Healthcare Providers:  IncredibleEmployment.be  This test is no t yet approved or cleared by the [place] FDA and  has been authorized for detection and/or diagnosis of SARS-CoV-2 by FDA under an Emergency Use Authorization (EUA). This EUA will remain  in effect (meaning this test can be used) for the duration of the COVID-19 declaration under Section 564(b)(1) of the Act, 21 U.S.C.section 360bbb-3(b)(1), unless the authorization is terminated  or revoked sooner.       Influenza A by PCR NEGATIVE NEGATIVE Final   Influenza B by PCR NEGATIVE NEGATIVE Final    Comment: (NOTE) The Xpert Xpress SARS-CoV-2/FLU/RSV plus assay is intended as an aid in the diagnosis of influenza from Nasopharyngeal swab specimens and should not be used as a sole basis for treatment. Nasal washings and aspirates are unacceptable for Xpert  Xpress SARS-CoV-2/FLU/RSV testing.  Fact Sheet for  Patients: EntrepreneurPulse.com.au  Fact Sheet for Healthcare Providers: IncredibleEmployment.be  This test is not yet approved or cleared by the [place] FDA and has been authorized for detection and/or diagnosis of SARS-CoV-2 by FDA under an Emergency Use Authorization (EUA). This EUA will remain in effect (meaning this test can be used) for the duration of the COVID-19 declaration under Section 564(b)(1) of the Act, 21 U.S.C. section 360bbb-3(b)(1), unless the authorization is terminated or revoked.  Performed at [facility], [address], [place], [place] [address]   Blood culture (routine x 2)     Status: None   Collection Time: 03/14/22  2:39 PM   Specimen: BLOOD  Result Value Ref Range Status   Specimen Description BLOOD RIGHT ANTECUBITAL  Final   Special Requests   Final    BOTTLES DRAWN AEROBIC AND ANAEROBIC Blood Culture adequate volume   Culture   Final    NO GROWTH 5 DAYS Performed at [facility], [address], [place], [place] [address]    Report Status 03/19/2022 FINAL  Final  Blood culture (routine x 2)     Status: None   Collection Time: 03/14/22  2:39 PM   Specimen: BLOOD  Result Value Ref Range Status   Specimen Description BLOOD BLOOD RIGHT HAND  Final   Special Requests   Final    BOTTLES DRAWN AEROBIC AND ANAEROBIC Blood Culture adequate volume   Culture   Final    NO GROWTH 5 DAYS Performed at [facility], [address] [place], [place] [address]    Report Status 03/19/2022 FINAL  Final  MRSA Next Gen by PCR, Nasal     Status: None   Collection Time: 03/14/22  4:07 PM   Specimen: Nasal Mucosa; Nasal Swab  Result Value Ref Range Status   MRSA by PCR Next Gen NOT DETECTED NOT DETECTED Final    Comment: (NOTE) The GeneXpert MRSA Assay (FDA approved for NASAL specimens only), is one component of a comprehensive MRSA colonization surveillance program. It is not intended to diagnose MRSA infection nor to  guide or monitor treatment for MRSA infections. Test performance is not FDA approved in patients less than 74 years old. Performed at [facility], [address], [place],  [address]   SARS Coronavirus 2 by RT PCR (hospital order, performed in [facility] hospital lab) *cepheid single result test* Anterior Nasal Swab     Status: Abnormal   Collection Time: 03/17/22  3:46 PM   Specimen: Anterior Nasal Swab  Result Value Ref Range Status   SARS Coronavirus 2 by RT PCR POSITIVE (A) NEGATIVE Final    Comment: (NOTE) SARS-CoV-2 target nucleic acids are DETECTED  SARS-CoV-2 RNA is generally detectable in upper respiratory specimens  during the acute phase of infection.  Positive results are indicative  of the presence of the identified virus, but do not rule out bacterial infection or co-infection with other pathogens not detected by the test.  Clinical correlation with patient history and  other diagnostic information is necessary to determine patient infection status.  The expected result is negative.  Fact Sheet for Patients:   https://www.patel.info/   Fact Sheet for Healthcare Providers:   https://hall.com/    This test is not yet approved or cleared by the [place] FDA and  has been authorized for detection and/or diagnosis of SARS-CoV-2 by FDA under an Emergency Use Authorization (EUA).  This EUA will remain in effect (meaning this test  can be used) for the duration of  the COVID-19 declaration under Section 564(b)(1)  of the Act, 21 U.S.C. section 360-bbb-3(b)(1), unless the authorization is terminated or revoked sooner.   Performed at [facility], [address], [place], [place] [address]      Radiology Studies: No results found.   Marzetta Board, MD, PhD [facility]  Between 7 am - 7 pm I am available, please contact me via Amion (for emergencies) or Securechat (non urgent messages)  Between 7 pm - 7 am  I am not available, please contact night coverage MD/APP via Amion

## 2022-03-20 DIAGNOSIS — R7989 Other specified abnormal findings of blood chemistry: Secondary | ICD-10-CM | POA: Diagnosis not present

## 2022-03-20 DIAGNOSIS — R338 Other retention of urine: Secondary | ICD-10-CM | POA: Diagnosis not present

## 2022-03-20 DIAGNOSIS — I4891 Unspecified atrial fibrillation: Secondary | ICD-10-CM | POA: Diagnosis not present

## 2022-03-20 DIAGNOSIS — N179 Acute kidney failure, unspecified: Secondary | ICD-10-CM | POA: Diagnosis not present

## 2022-03-20 LAB — CBC
HCT: 35.2 % — ABNORMAL LOW (ref 39.0–52.0)
Hemoglobin: 12 g/dL — ABNORMAL LOW (ref 13.0–17.0)
MCH: 32.5 pg (ref 26.0–34.0)
MCHC: 34.1 g/dL (ref 30.0–36.0)
MCV: 95.4 fL (ref 80.0–100.0)
Platelets: 140 10*3/uL — ABNORMAL LOW (ref 150–400)
RBC: 3.69 MIL/uL — ABNORMAL LOW (ref 4.22–5.81)
RDW: 12.7 % (ref 11.5–15.5)
WBC: 10 10*3/uL (ref 4.0–10.5)
nRBC: 0 % (ref 0.0–0.2)

## 2022-03-20 NOTE — Progress Notes (Signed)
PROGRESS NOTE  Geoffrey Patterson FHL:[id number] DOB: 05/30/1951 DOA: 03/14/2022 PCP: Celene Squibb, MD  Brief History:  71 year old male with history of gout, hypertension,?  A-fib brought to the ED with weakness unable to get up and poor appetite following his left shoulder surgery on 9/24 for left shoulder fracture which he sustained after he fell while walking on 02/17/2022. In the ED vitals stable significant AKI with creatinine 37.3, metabolic acidosis EKG with A-fib, leukocytosis chest x-ray patchy retrocardiac airspace opacity in the left lower lobe atelectasis or pneumonia, Foley catheter was inserted immediately drained 2.9 L of pink-tinged urine given IV vancomycin and cefepime nephrology was discussed and admitted after giving a dose of bicarb. Patient admitted on IV fluid hydration  Urology consulted, there is concern for bladder mass, recommending discharge with Foley catheter and outpatient follow-up for voiding trial and cystoscopy.  He was slated for discharge to SNF on 10/4 however COVID resulted positive, delaying his discharge until Monday.  Assessment/Plan:   Principal Problem:   AKI (acute kidney injury) ([place]) Active Problems:   Acute urinary retention   Essential hypertension   Atrial fibrillation (HCC)   Dyspnea  Assessment and Plan:   AKI  Metabolic acidosis 2/2 AKI Hyperkalemia 2/2 AKI-Secondary to postobstructive urinary retention, BPH.  -AKI resolved with IV fluids and Foley insertion. ARB discontinued, off IV fluids, kidney function has normalized -He was seen by urology, recommending Foley catheter at discharge.  He was started on Flomax, voiding trial as an outpatient in 1 to 2 weeks Renal ultrasound--negative for hydronephrosis but showed urinary bladder mass   Acute urinary retention -Foley catheter inserted in the ED as discussed above -Patient will maintain Foley catheter at the time of discharge -Follow-up in the office with Dr. 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   Asymptomatic.  He was started on molnupiravir for total of 5 days    Mildly elevated troponin  -flat suspect in the setting of severe AKI/demand ischemia.   -No chest pain   A-fib-type unspecified - rate controlled on Cardizem not on anticoagulation,  - previously on AC 2-3 years ago, but he stopped due to his concerns for bleeding and cose - currently does not want to go back on [facility] but agreeable to ASA Advised op fu with cards and pcp.    Essential hypertension- -continue Cardizem CD   Class I Obesity -Patient's Body mass index is 32.55 kg/m.  -lifestyle modification   Left shoulder surgery on 9/24 -for left shoulder fracture which he sustained after he fell while walking on 02/17/2022.   S/p left reverse total shoulder arthroplasty Surgery/dressing placed.   Orthopedic surgeon was aware-they will follow-up as outpatient.     Family Communication:   no Family at bedside  Consultants:  urology  Code Status:  FULL  DVT Prophylaxis:  [place] Heparin    Procedures: As Listed in Progress Note Above  Antibiotics: None       Subjective:  Patient denies fevers, chills, headache, chest pain, dyspnea, nausea, vomiting, diarrhea, abdominal pain, dysuria, hematuria, hematochezia, and melena.  Objective: Vitals:   03/19/22 0543 03/19/22 1218 03/19/22 2109 03/20/22 0552  BP: (!) 135/96 132/86 (!) 132/90 (!) 141/88  Pulse: 84 76 86 78  Resp: '19 18 18 18  '$ Temp: 97.9 F (36.6 C) 97.7 F (36.5 C) 98 F (36.7 C) 98.2 F (36.8 C)  TempSrc:   Oral Oral  SpO2: 97% 99% 99% 98%  Weight:      Height:        Intake/Output Summary (Last 24 hours) at 03/20/2022 1159 Last data filed at 03/20/2022 0900 Gross per 24 hour  Intake 600 ml   Output 3100 ml  Net -2500 ml   Weight change:  Exam:  General:  Pt is alert, follows commands appropriately, not in acute distress HEENT: No icterus, No thrush, No neck mass, [place]/AT Cardiovascular: RRR, S1/S2, no rubs, no gallops Respiratory: CTA bilaterally, no wheezing, no crackles, no rhonchi Abdomen: Soft/+BS, non tender, non distended, no guarding Extremities: No edema, No lymphangitis, No petechiae, No rashes, no synovitis   Data Reviewed: I have personally reviewed following labs and imaging studies Basic Metabolic Panel: Recent Labs  Lab 03/14/22 1330 03/15/22 0715 03/16/22 0431 03/17/22 0422 03/18/22 0503  NA 131* 142 143 140 139  K 5.9* 4.5 4.6 4.3 3.8  CL 99 121* 117* 112* 111  CO2 13* 15* 18* 18* 20*  GLUCOSE 116* 95 96 108* 98  BUN 160* 75* 38* 21 17  CREATININE 12.49* 3.37* 1.24 0.91 0.80  CALCIUM 8.0* 7.4* 8.1* 8.2* 8.2*   Liver Function Tests: Recent Labs  Lab 03/14/22 1330  AST 42*  ALT 20  ALKPHOS 154*  BILITOT 1.0  PROT 5.9*  ALBUMIN 2.7*   No results for input(s): "LIPASE", "AMYLASE" in the last 168 hours. No results for input(s): "AMMONIA" in the last 168 hours. Coagulation Profile: No results for input(s): "INR", "PROTIME" in the last 168 hours. CBC: Recent Labs  Lab 03/14/22 1330 03/15/22 0714 03/16/22 0431 03/17/22 0422 03/18/22 0503 03/19/22 0520 03/20/22 0621  WBC 14.1*   < > 8.4 9.0 9.3 9.7 10.0  NEUTROABS 12.1*  --   --   --   --   --   --   HGB 12.2*   < > 10.6* 10.7* 10.7* 11.0* 12.0*  HCT 34.2*   < > 31.7* 31.8* 31.8* 32.0* 35.2*  MCV 93.2   < > 97.5 95.8 97.0 95.0 95.4  PLT 165   < > 163 180 178 180 140*   < > = values in this interval not displayed.   Cardiac Enzymes: Recent Labs  Lab 03/14/22 1801  CKTOTAL 49   BNP: Invalid input(s): "POCBNP" CBG: No results for input(s): "GLUCAP" in the last 168 hours. HbA1C: No results for input(s): "HGBA1C" in the last 72 hours. Urine analysis:    Component Value  Date/Time   COLORURINE AMBER (A) 03/14/2022 1309   APPEARANCEUR HAZY (A) 03/14/2022 1309   LABSPEC 1.010 03/14/2022 1309   PHURINE 6.0 03/14/2022 1309   GLUCOSEU NEGATIVE 03/14/2022 1309   HGBUR LARGE (A) 03/14/2022 1309   BILIRUBINUR NEGATIVE 03/14/2022 1309   KETONESUR NEGATIVE 03/14/2022 1309   PROTEINUR 100 (A) 03/14/2022 1309   NITRITE NEGATIVE 03/14/2022 1309   LEUKOCYTESUR LARGE (A) 03/14/2022 1309   Sepsis Labs: '@LABRCNTIP'$ (procalcitonin:4,lacticidven:4) ) Recent Results (from the past 240 hour(s))  Resp Panel by RT-PCR (Flu A&B, Covid) Anterior Nasal Swab     Status: None   Collection Time: 03/14/22  1:39 PM   Specimen: Anterior Nasal Swab  Result Value Ref Range Status   SARS Coronavirus 2 by RT PCR NEGATIVE NEGATIVE Final    Comment: (NOTE) SARS-CoV-2 target nucleic acids are NOT DETECTED.  The SARS-CoV-2 RNA is generally detectable in upper respiratory specimens during the acute phase of infection. The lowest concentration of SARS-CoV-2 viral copies this assay can detect is 138 copies/mL. A negative result does not preclude SARS-Cov-2 infection and should not be used as the sole basis for treatment or other patient management decisions. A negative result may occur with  improper specimen collection/handling, submission of specimen other than nasopharyngeal swab, presence of viral mutation(s) within the areas targeted by this assay, and inadequate number of viral copies(<138 copies/mL). A negative result must be combined with clinical observations, patient history, and epidemiological information. The expected result is Negative.  Fact Sheet for Patients:  EntrepreneurPulse.com.au  Fact Sheet for Healthcare Providers:  IncredibleEmployment.be  This test is no t yet approved or cleared by the [place] FDA and  has been authorized for detection and/or diagnosis of SARS-CoV-2 by FDA under an Emergency Use Authorization  (EUA). This EUA will remain  in effect (meaning this test can be used) for the duration of the COVID-19 declaration under Section 564(b)(1) of the Act, 21 U.S.C.section 360bbb-3(b)(1), unless the authorization is terminated  or revoked sooner.       Influenza A by PCR NEGATIVE NEGATIVE Final   Influenza B by PCR NEGATIVE NEGATIVE Final    Comment: (NOTE) The Xpert Xpress SARS-CoV-2/FLU/RSV plus assay is intended as an aid in the diagnosis of influenza from Nasopharyngeal swab specimens and should not be used as a sole basis for treatment. Nasal washings and aspirates are unacceptable for Xpert Xpress SARS-CoV-2/FLU/RSV testing.  Fact Sheet for Patients: EntrepreneurPulse.com.au  Fact Sheet for Healthcare Providers: IncredibleEmployment.be  This test is not yet approved or cleared by the [place] FDA and has been authorized for detection and/or diagnosis of SARS-CoV-2 by FDA under an Emergency Use Authorization (EUA). This EUA will remain in effect (meaning this test can be used) for the duration of the COVID-19 declaration under Section 564(b)(1) of the Act, 21 U.S.C. section 360bbb-3(b)(1), unless the authorization is terminated or revoked.  Performed at [facility], [address] [place], [place] [address]   Blood culture (routine x 2)     Status: None   Collection Time: 03/14/22  2:39 PM   Specimen: BLOOD  Result Value Ref Range Status   Specimen Description BLOOD RIGHT ANTECUBITAL  Final   Special Requests   Final    BOTTLES DRAWN AEROBIC AND ANAEROBIC Blood Culture adequate volume   Culture   Final    NO GROWTH 5 DAYS Performed at [facility], [address], [place], [place] [address]    Report Status 03/19/2022 FINAL  Final  Blood culture (routine x 2)     Status: None   Collection Time: 03/14/22  2:39 PM   Specimen: BLOOD  Result Value Ref Range Status   Specimen Description BLOOD BLOOD RIGHT HAND  Final   Special  Requests   Final    BOTTLES DRAWN AEROBIC AND ANAEROBIC Blood Culture adequate volume   Culture   Final    NO GROWTH 5 DAYS Performed at [facility], [address] [place], [place] [address]    Report Status 03/19/2022 FINAL  Final  MRSA Next Gen by PCR, Nasal     Status: None   Collection Time: 03/14/22  4:07 PM   Specimen: Nasal Mucosa; Nasal Swab  Result Value Ref Range Status   MRSA by PCR Next Gen NOT DETECTED NOT DETECTED Final    Comment: (NOTE) The GeneXpert MRSA Assay (FDA approved  for NASAL specimens only), is one component of a comprehensive MRSA colonization surveillance program. It is not intended to diagnose MRSA infection nor to guide or monitor treatment for MRSA infections. Test performance is not FDA approved in patients less than 26 years old. Performed at [facility], [address] [place], [place] [address]   SARS Coronavirus 2 by RT PCR (hospital order, performed in [facility] hospital lab) *cepheid single result test* Anterior Nasal Swab     Status: Abnormal   Collection Time: 03/17/22  3:46 PM   Specimen: Anterior Nasal Swab  Result Value Ref Range Status   SARS Coronavirus 2 by RT PCR POSITIVE (A) NEGATIVE Final    Comment: (NOTE) SARS-CoV-2 target nucleic acids are DETECTED  SARS-CoV-2 RNA is generally detectable in upper respiratory specimens  during the acute phase of infection.  Positive results are indicative  of the presence of the identified virus, but do not rule out bacterial infection or co-infection with other pathogens not detected by the test.  Clinical correlation with patient history and  other diagnostic information is necessary to determine patient infection status.  The expected result is negative.  Fact Sheet for Patients:   https://www.patel.info/   Fact Sheet for Healthcare Providers:   https://hall.com/    This test is not yet approved or cleared by the [place] FDA and  has  been authorized for detection and/or diagnosis of SARS-CoV-2 by FDA under an Emergency Use Authorization (EUA).  This EUA will remain in effect (meaning this test can be used) for the duration of  the COVID-19 declaration under Section 564(b)(1)  of the Act, 21 U.S.C. section 360-bbb-3(b)(1), unless the authorization is terminated or revoked sooner.   Performed at [facility], [address] [place], [place] [address]      Scheduled Meds:  Chlorhexidine Gluconate Cloth  6 each Topical Q0600   diltiazem  120 mg Oral Daily   heparin  5,000 Units Subcutaneous Q8H   hydrocortisone   Rectal BID   molnupiravir EUA  4 capsule Oral BID   tamsulosin  0.4 mg Oral QPC supper   Continuous Infusions:  Procedures/Studies: DG Chest Port 1 View  Result Date: 03/16/2022 CLINICAL DATA:  Shortness of breath. EXAM: PORTABLE CHEST 1 VIEW COMPARISON:  Chest x-ray dated March 14, 2022 FINDINGS: The heart size and mediastinal contours are within normal limits. Linear nodular opacity of the left lower lung, not seen on prior exam. Lungs otherwise clear. Prior reverse left total shoulder arthroplasty. IMPRESSION: Linear nodular opacity of the left lower lung, possibly new or not visualized due to differences in angulation. Differential considerations include pulmonary nodule or new focus of atelectasis. Recommend attention on follow-up, if nodular opacity does not resolve, CT would be indicated for further evaluation. Electronically Signed   By: Yetta Glassman M.D.   On: 03/16/2022 13:50   US Renal  Result Date: 03/15/2022 CLINICAL DATA:  Acute kidney injury. EXAM: RENAL / URINARY TRACT ULTRASOUND COMPLETE COMPARISON:  None Available. FINDINGS: Right Kidney: Renal measurements: 12.6 x 7.6 x 6.1 cm = volume: 305 mL. Echogenicity within normal limits. No mass or hydronephrosis visualized. Left Kidney: Renal measurements: 13.1 x 7.9 x 6.6 cm = volume: 358 mL. Echogenicity within normal limits. No mass or  hydronephrosis visualized. Bladder: There appears to be a solid mass in the urinary bladder measuring 6.2 x 5.5 x 2.9 cm. Other: None. IMPRESSION: Kidneys are unremarkable. However, there appears to be a solid mass in the urinary bladder measuring  6.2 x 5.5 x 2.9 cm. Further evaluation with CT scan or cystoscopy is recommended. These results will be called to the ordering clinician or representative by the Radiologist Assistant, and communication documented in the PACS or zVision Dashboard. Electronically Signed   By: Marijo Conception M.D.   On: 03/15/2022 13:17   DG Chest Port 1 View  Result Date: 03/14/2022 CLINICAL DATA:  Fatigue. EXAM: PORTABLE CHEST 1 VIEW COMPARISON:  None Available. FINDINGS: Mildly enlarged cardiac silhouette. Patchy retrocardiac airspace opacity in the left lower lobe with probable small left pleural effusion. Right lung is clear. No pneumothorax. Left total shoulder arthroplasty. No acute osseous abnormality. IMPRESSION: Patchy retrocardiac airspace opacity in the left lower lobe with probable small left pleural effusion, which could reflect atelectasis versus pneumonia in the appropriate clinical context. Electronically Signed   By: Ileana Roup M.D.   On: 03/14/2022 14:22   DG Shoulder Left Port  Result Date: 03/07/2022 CLINICAL DATA:  Status post reverse shoulder arthroplasty. EXAM: LEFT SHOULDER COMPARISON:  February 18, 2022 FINDINGS: Post total left shoulder arthroplasty with normal alignment of the orthopedic hardware. Redemonstrated is left humeral neck comminuted fracture. IMPRESSION: 1. Post total left shoulder arthroplasty without evidence of immediate complications. 2. Left humeral neck comminuted fracture. Electronically Signed   By: Fidela Salisbury M.D.   On: 03/07/2022 14:27   CT SHOULDER LEFT WO CONTRAST  Result Date: 02/26/2022 CLINICAL DATA:  Fall on 02/15/2022.  Left proximal humeral fracture. EXAM: CT OF THE UPPER LEFT EXTREMITY WITHOUT CONTRAST  TECHNIQUE: Multidetector CT imaging of the upper left extremity was performed according to the standard protocol. RADIATION DOSE REDUCTION: This exam was performed according to the departmental dose-optimization program which includes automated exposure control, adjustment of the mA and/or kV according to patient size and/or use of iterative reconstruction technique. COMPARISON:  Radiograph dated February 18, 2022 FINDINGS: Bones/Joint/Cartilage There is a comminuted impaction fracture of the left proximal humerus involving the femoral neck, head, greater and lesser tuberosities. There is approximately 2 cm superior displacement of the humeral neck/shaft. There is approximately 1.5 cm posterior displacement of the humeral head. The fracture extends into the articular surface of the humeral head. No appreciable fracture of the scapula. No appreciable rib fracture. Mild arthritic changes of the acromioclavicular joint and glenohumeral joints. Ligaments Suboptimally assessed by CT. Muscles and Tendons Soft tissue swelling and edema about the comminuted fracture. Muscles and tendons appear intact. Soft tissues No significant hematoma or fluid collection. IMPRESSION: 1. Comminuted impaction fracture of the left proximal humerus involving femoral neck, head, greater and lesser tuberosities with 2 cm superior displacement of the humeral neck as well as 1.5 cm posterior displacement of the humeral head. The findings likely represent 3 part fracture (Neer classification). 2. Mild arthritic changes of the acromioclavicular and glenohumeral joints. 3.  Soft tissue swelling and edema about the comminuted fracture. Electronically Signed   By: Keane Police D.O.   On: 02/26/2022 17:58   DG Shoulder Left  Result Date: 02/18/2022 CLINICAL DATA:  LEFT shoulder pain, fell yesterday EXAM: LEFT SHOULDER - 2+ VIEW COMPARISON:  None FINDINGS: Osseous demineralization. AC joint alignment normal. Inferior acromial spur formation.  Comminuted fracture involving the surgical neck and head of LEFT humerus, minimally displaced. No dislocation. Visualized ribs intact. IMPRESSION: Comminuted minimally displaced fracture involving the head and surgical neck of LEFT humerus. Electronically Signed   By: Lavonia Dana M.D.   On: 02/18/2022 12:39    Orson Eva, DO  [facility]  If 7PM-7AM, please contact night-coverage [email or web address] Password [email or web address] 03/20/2022, 11:59 AM   LOS: 6 days

## 2022-03-21 DIAGNOSIS — R7989 Other specified abnormal findings of blood chemistry: Secondary | ICD-10-CM | POA: Diagnosis not present

## 2022-03-21 DIAGNOSIS — I4891 Unspecified atrial fibrillation: Secondary | ICD-10-CM | POA: Diagnosis not present

## 2022-03-21 DIAGNOSIS — J189 Pneumonia, unspecified organism: Secondary | ICD-10-CM | POA: Diagnosis not present

## 2022-03-21 DIAGNOSIS — N179 Acute kidney failure, unspecified: Secondary | ICD-10-CM | POA: Diagnosis not present

## 2022-03-21 LAB — GLUCOSE, CAPILLARY: Glucose-Capillary: 101 mg/dL — ABNORMAL HIGH (ref 70–99)

## 2022-03-21 NOTE — Discharge Summary (Signed)
Physician Discharge Summary   Patient: Geoffrey Patterson MRN: [id number] DOB: [birth year]  Admit date:     03/14/2022  Discharge date: 03/22/2022  Discharge Physician: Shanon Brow Carlisa Eble   PCP: Celene Squibb, MD   Recommendations at discharge:   Please follow up with primary care provider within 1-2 weeks  Please repeat BMP, LFTs and CBC in one week    Hospital Course: 71 year old male with history of gout, hypertension,?  A-fib brought to the ED with weakness unable to get up and poor appetite following his left shoulder surgery on 9/24 for left shoulder fracture which he sustained after he fell while walking on 02/17/2022. In the ED vitals stable significant AKI with creatinine 07.3, metabolic acidosis EKG with A-fib, leukocytosis chest x-ray patchy retrocardiac airspace opacity in the left lower lobe atelectasis or pneumonia, Foley catheter was inserted immediately drained 2.9 L of pink-tinged urine given IV vancomycin and cefepime nephrology was discussed and admitted after giving a dose of bicarb. Patient admitted on IV fluid hydration  Urology consulted, there is concern for bladder mass, recommending discharge with Foley catheter and outpatient follow-up for voiding trial and cystoscopy.  He was slated for discharge to SNF on 10/4 however COVID resulted positive, delaying his discharge until Monday.  Assessment and Plan: AKI  Metabolic acidosis 2/2 AKI Hyperkalemia 2/2 AKI-Secondary to postobstructive urinary retention, BPH.  -AKI resolved with IV fluids and Foley insertion. ARB discontinued, off IV fluids, kidney function has normalized -He was seen by urology, recommending Foley catheter at discharge.  He was started on Flomax, voiding trial as an outpatient in 1 to 2 weeks Renal ultrasound--negative for hydronephrosis but showed urinary bladder mass -serum creatinine 0.88 at time of d/c   Acute urinary retention -Foley catheter inserted in the ED as discussed above -Patient will maintain  Foley catheter at the time of discharge until he follows up with Dr. Alyson Ingles -Follow-up in the office with Dr. 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   Asymptomatic.  He was started on molnupiravir for total of 5 days -last dose of molnupiravir 03/22/22 aat 2200    Mildly elevated troponin  -flat suspect in the setting of severe AKI/demand ischemia.   -No chest pain   A-fib-type unspecified - rate controlled on Cardizem not on anticoagulation,  - previously on AC 2-3 years ago, but he stopped due to his concerns for bleeding and cose - currently does not want to go back on [facility] but agreeable to ASA Advised op fu with cards and pcp.    Essential hypertension- -continue Cardizem CD   Class I Obesity -Patient's Body mass index is 32.55 kg/m.  -lifestyle modification   Left shoulder surgery on 9/24 -for left shoulder fracture which he sustained after he fell while walking on 02/17/2022.   S/p left reverse total shoulder arthroplasty Surgery/dressing placed.   Orthopedic surgeon was aware-they will follow-up as outpatient.   Transaminasemia -due to COVID -asymptomatic -repeat LFTs one week after e/c -tolerating diet        Pain control - [place] Controlled Substance Reporting System database was reviewed. and patient was instructed, not to drive, operate heavy machinery, perform activities at heights, swimming or participation in water activities or provide baby-sitting services while on Pain, Sleep and Anxiety Medications; until their  outpatient Physician has advised to do so again. Also recommended to not to take more than prescribed Pain, Sleep and Anxiety Medications.  Consultants: urology Procedures performed: none  Disposition: Skilled nursing  facility Diet recommendation:  Cardiac diet DISCHARGE MEDICATION: Allergies as of 03/21/2022   No Known Allergies      Medication List     STOP taking these medications    telmisartan 80 MG tablet Commonly known as: MICARDIS       TAKE these medications    allopurinol 300 MG tablet Commonly known as: ZYLOPRIM Take 300 mg by mouth daily.   Cardizem CD 120 MG 24 hr capsule Generic drug: diltiazem Take 120 mg by mouth daily.   fexofenadine 180 MG tablet Commonly known as: ALLEGRA Take 180 mg by mouth daily.   ondansetron 4 MG tablet Commonly known as: Zofran Take 1 tablet (4 mg total) by mouth every 8 (eight) hours as needed for nausea or vomiting.   rosuvastatin 5 MG tablet Commonly known as: CRESTOR Take 5 mg by mouth daily.   sennosides-docusate sodium 8.6-50 MG tablet Commonly known as: SENOKOT-S Take 2 tablets by mouth daily.   tamsulosin 0.4 MG Caps capsule Commonly known as: FLOMAX Take 1 capsule (0.4 mg total) by mouth daily after supper.   traMADol 50 MG tablet Commonly known as: Ultram Take 1 tablet (50 mg total) by mouth every 4 (four) hours.   valACYclovir 1000 MG tablet Commonly known as: VALTREX Take 2,000 mg by mouth 2 (two) times daily as needed (fever blisters).        Contact information for follow-up providers     Celene Squibb, MD Follow up in 1 week(s).   Specialty: Internal Medicine Contact information: [facility] [address] [phone]         Alyson Ingles Candee Furbish, MD Follow up in 1 week(s).   Specialty: Urology Contact information: [place] [address] [phone]              Contact information for after-discharge care     [facility] .   Service: Skilled Nursing Contact information: [address] [place] [phone]                    Discharge Exam: Danley Danker  Weights   03/14/22 1239  Weight: 108.9 kg   HEENT:  [place]/AT, No thrush, no icterus CV:  RRR, no rub, no S3, no S4 Lung:  CTA, no wheeze, no rhonchi Abd:  soft/+BS, NT Ext:  No edema, no lymphangitis, no synovitis, no rash   Condition at discharge: stable  The results of significant diagnostics from this hospitalization (including imaging, microbiology, ancillary and laboratory) are listed below for reference.   Imaging Studies: DG Chest Port 1 View  Result Date: 03/16/2022 CLINICAL DATA:  Shortness of breath. EXAM: PORTABLE CHEST 1 VIEW COMPARISON:  Chest x-ray dated March 14, 2022 FINDINGS: The heart size and mediastinal contours are within normal limits. Linear nodular opacity of the left lower lung, not seen on prior exam. Lungs otherwise clear. Prior reverse left total shoulder arthroplasty. IMPRESSION: Linear nodular opacity of the left lower lung, possibly new or not visualized due to differences in angulation. Differential considerations include pulmonary nodule or new focus of atelectasis. Recommend attention on follow-up, if nodular opacity does not resolve, CT would be indicated for further evaluation. Electronically Signed   By:  Yetta Glassman M.D.   On: 03/16/2022 13:50   US Renal  Result Date: 03/15/2022 CLINICAL DATA:  Acute kidney injury. EXAM: RENAL / URINARY TRACT ULTRASOUND COMPLETE COMPARISON:  None Available. FINDINGS: Right Kidney: Renal measurements: 12.6 x 7.6 x 6.1 cm = volume: 305 mL. Echogenicity within normal limits. No mass or hydronephrosis visualized. Left Kidney: Renal measurements: 13.1 x 7.9 x 6.6 cm = volume: 358 mL. Echogenicity within normal limits. No mass or hydronephrosis visualized. Bladder: There appears to be a solid mass in the urinary bladder measuring 6.2 x 5.5 x 2.9 cm. Other: None. IMPRESSION: Kidneys are unremarkable. However, there appears to be a solid mass in the urinary bladder measuring 6.2 x 5.5 x 2.9 cm. Further evaluation with CT scan or  cystoscopy is recommended. These results will be called to the ordering clinician or representative by the Radiologist Assistant, and communication documented in the PACS or zVision Dashboard. Electronically Signed   By: Marijo Conception M.D.   On: 03/15/2022 13:17   DG Chest Port 1 View  Result Date: 03/14/2022 CLINICAL DATA:  Fatigue. EXAM: PORTABLE CHEST 1 VIEW COMPARISON:  None Available. FINDINGS: Mildly enlarged cardiac silhouette. Patchy retrocardiac airspace opacity in the left lower lobe with probable small left pleural effusion. Right lung is clear. No pneumothorax. Left total shoulder arthroplasty. No acute osseous abnormality. IMPRESSION: Patchy retrocardiac airspace opacity in the left lower lobe with probable small left pleural effusion, which could reflect atelectasis versus pneumonia in the appropriate clinical context. Electronically Signed   By: Ileana Roup M.D.   On: 03/14/2022 14:22   DG Shoulder Left Port  Result Date: 03/07/2022 CLINICAL DATA:  Status post reverse shoulder arthroplasty. EXAM: LEFT SHOULDER COMPARISON:  February 18, 2022 FINDINGS: Post total left shoulder arthroplasty with normal alignment of the orthopedic hardware. Redemonstrated is left humeral neck comminuted fracture. IMPRESSION: 1. Post total left shoulder arthroplasty without evidence of immediate complications. 2. Left humeral neck comminuted fracture. Electronically Signed   By: Fidela Salisbury M.D.   On: 03/07/2022 14:27   CT SHOULDER LEFT WO CONTRAST  Result Date: 02/26/2022 CLINICAL DATA:  Fall on 02/15/2022.  Left proximal humeral fracture. EXAM: CT OF THE UPPER LEFT EXTREMITY WITHOUT CONTRAST TECHNIQUE: Multidetector CT imaging of the upper left extremity was performed according to the standard protocol. RADIATION DOSE REDUCTION: This exam was performed according to the departmental dose-optimization program which includes automated exposure control, adjustment of the mA and/or kV according to  patient size and/or use of iterative reconstruction technique. COMPARISON:  Radiograph dated February 18, 2022 FINDINGS: Bones/Joint/Cartilage There is a comminuted impaction fracture of the left proximal humerus involving the femoral neck, head, greater and lesser tuberosities. There is approximately 2 cm superior displacement of the humeral neck/shaft. There is approximately 1.5 cm posterior displacement of the humeral head. The fracture extends into the articular surface of the humeral head. No appreciable fracture of the scapula. No appreciable rib fracture. Mild arthritic changes of the acromioclavicular joint and glenohumeral joints. Ligaments Suboptimally assessed by CT. Muscles and Tendons Soft tissue swelling and edema about the comminuted fracture. Muscles and tendons appear intact. Soft tissues No significant hematoma or fluid collection. IMPRESSION: 1. Comminuted impaction fracture of the left proximal humerus involving femoral neck, head, greater and lesser tuberosities with 2 cm superior displacement of the humeral neck as well as 1.5 cm posterior displacement of the humeral head. The findings likely represent 3 part fracture (Neer classification). 2. Mild arthritic changes of the acromioclavicular and  glenohumeral joints. 3.  Soft tissue swelling and edema about the comminuted fracture. Electronically Signed   By: Keane Police D.O.   On: 02/26/2022 17:58    Microbiology: Results for orders placed or performed during the hospital encounter of 03/14/22  Resp Panel by RT-PCR (Flu A&B, Covid) Anterior Nasal Swab     Status: None   Collection Time: 03/14/22  1:39 PM   Specimen: Anterior Nasal Swab  Result Value Ref Range Status   SARS Coronavirus 2 by RT PCR NEGATIVE NEGATIVE Final    Comment: (NOTE) SARS-CoV-2 target nucleic acids are NOT DETECTED.  The SARS-CoV-2 RNA is generally detectable in upper respiratory specimens during the acute phase of infection. The lowest concentration of  SARS-CoV-2 viral copies this assay can detect is 138 copies/mL. A negative result does not preclude SARS-Cov-2 infection and should not be used as the sole basis for treatment or other patient management decisions. A negative result may occur with  improper specimen collection/handling, submission of specimen other than nasopharyngeal swab, presence of viral mutation(s) within the areas targeted by this assay, and inadequate number of viral copies(<138 copies/mL). A negative result must be combined with clinical observations, patient history, and epidemiological information. The expected result is Negative.  Fact Sheet for Patients:  EntrepreneurPulse.com.au  Fact Sheet for Healthcare Providers:  IncredibleEmployment.be  This test is no t yet approved or cleared by the [place] FDA and  has been authorized for detection and/or diagnosis of SARS-CoV-2 by FDA under an Emergency Use Authorization (EUA). This EUA will remain  in effect (meaning this test can be used) for the duration of the COVID-19 declaration under Section 564(b)(1) of the Act, 21 U.S.C.section 360bbb-3(b)(1), unless the authorization is terminated  or revoked sooner.       Influenza A by PCR NEGATIVE NEGATIVE Final   Influenza B by PCR NEGATIVE NEGATIVE Final    Comment: (NOTE) The Xpert Xpress SARS-CoV-2/FLU/RSV plus assay is intended as an aid in the diagnosis of influenza from Nasopharyngeal swab specimens and should not be used as a sole basis for treatment. Nasal washings and aspirates are unacceptable for Xpert Xpress SARS-CoV-2/FLU/RSV testing.  Fact Sheet for Patients: EntrepreneurPulse.com.au  Fact Sheet for Healthcare Providers: IncredibleEmployment.be  This test is not yet approved or cleared by the [place] FDA and has been authorized for detection and/or diagnosis of SARS-CoV-2 by FDA under an Emergency Use  Authorization (EUA). This EUA will remain in effect (meaning this test can be used) for the duration of the COVID-19 declaration under Section 564(b)(1) of the Act, 21 U.S.C. section 360bbb-3(b)(1), unless the authorization is terminated or revoked.  Performed at [facility], [address], [place], [place] [address]   Blood culture (routine x 2)     Status: None   Collection Time: 03/14/22  2:39 PM   Specimen: BLOOD  Result Value Ref Range Status   Specimen Description BLOOD RIGHT ANTECUBITAL  Final   Special Requests   Final    BOTTLES DRAWN AEROBIC AND ANAEROBIC Blood Culture adequate volume   Culture   Final    NO GROWTH 5 DAYS Performed at [facility], [address] [place], [place] [address]    Report Status 03/19/2022 FINAL  Final  Blood culture (routine x 2)     Status: None   Collection Time: 03/14/22  2:39 PM   Specimen: BLOOD  Result Value Ref Range Status   Specimen Description BLOOD BLOOD RIGHT HAND  Final   Special Requests  Final    BOTTLES DRAWN AEROBIC AND ANAEROBIC Blood Culture adequate volume   Culture   Final    NO GROWTH 5 DAYS Performed at [facility], [address] [place], [place] [address]    Report Status 03/19/2022 FINAL  Final  MRSA Next Gen by PCR, Nasal     Status: None   Collection Time: 03/14/22  4:07 PM   Specimen: Nasal Mucosa; Nasal Swab  Result Value Ref Range Status   MRSA by PCR Next Gen NOT DETECTED NOT DETECTED Final    Comment: (NOTE) The GeneXpert MRSA Assay (FDA approved for NASAL specimens only), is one component of a comprehensive MRSA colonization surveillance program. It is not intended to diagnose MRSA infection nor to guide or monitor treatment for MRSA infections. Test performance is not FDA approved in patients less than 72 years old. Performed at [facility], [address], [place], [place] [address]   SARS Coronavirus 2 by RT PCR (hospital order, performed in [facility] hospital lab) *cepheid single result  test* Anterior Nasal Swab     Status: Abnormal   Collection Time: 03/17/22  3:46 PM   Specimen: Anterior Nasal Swab  Result Value Ref Range Status   SARS Coronavirus 2 by RT PCR POSITIVE (A) NEGATIVE Final    Comment: (NOTE) SARS-CoV-2 target nucleic acids are DETECTED  SARS-CoV-2 RNA is generally detectable in upper respiratory specimens  during the acute phase of infection.  Positive results are indicative  of the presence of the identified virus, but do not rule out bacterial infection or co-infection with other pathogens not detected by the test.  Clinical correlation with patient history and  other diagnostic information is necessary to determine patient infection status.  The expected result is negative.  Fact Sheet for Patients:   https://www.patel.info/   Fact Sheet for Healthcare Providers:   https://hall.com/    This test is not yet approved or cleared by the [place] FDA and  has been authorized for detection and/or diagnosis of SARS-CoV-2 by FDA under an Emergency Use Authorization (EUA).  This EUA will remain in effect (meaning this test can be used) for the duration of  the COVID-19 declaration under Section 564(b)(1)  of the Act, 21 U.S.C. section 360-bbb-3(b)(1), unless the authorization is terminated or revoked sooner.   Performed at [facility], [address] [place], [place] [address]     Labs: CBC: Recent Labs  Lab 03/16/22 0431 03/17/22 0422 03/18/22 0503 03/19/22 0520 03/20/22 0621  WBC 8.4 9.0 9.3 9.7 10.0  HGB 10.6* 10.7* 10.7* 11.0* 12.0*  HCT 31.7* 31.8* 31.8* 32.0* 35.2*  MCV 97.5 95.8 97.0 95.0 95.4  PLT 163 180 178 180 144*   Basic Metabolic Panel: Recent Labs  Lab 03/15/22 0715 03/16/22 0431 03/17/22 0422 03/18/22 0503  NA 142 143 140 139  K 4.5 4.6 4.3 3.8  CL 121* 117* 112* 111  CO2 15* 18* 18* 20*  GLUCOSE 95 96 108* 98  BUN 75* 38* 21 17  CREATININE 3.37* 1.24 0.91 0.80   CALCIUM 7.4* 8.1* 8.2* 8.2*   Liver Function Tests: No results for input(s): "AST", "ALT", "ALKPHOS", "BILITOT", "PROT", "ALBUMIN" in the last 168 hours. CBG: No results for input(s): "GLUCAP" in the last 168 hours.  Discharge time spent: greater than 30 minutes.  Signed: Orson Eva, MD [facility] 03/21/2022

## 2022-03-21 NOTE — Progress Notes (Signed)
PROGRESS NOTE  Geoffrey Patterson JJH:[id number] DOB: 07-28-1950 DOA: 03/14/2022 PCP: Celene Squibb, MD  Brief History:  71 year old male with history of gout, hypertension,?  A-fib brought to the ED with weakness unable to get up and poor appetite following his left shoulder surgery on 9/24 for left shoulder fracture which he sustained after he fell while walking on 02/17/2022. In the ED vitals stable significant AKI with creatinine 81.8, metabolic acidosis EKG with A-fib, leukocytosis chest x-ray patchy retrocardiac airspace opacity in the left lower lobe atelectasis or pneumonia, Foley catheter was inserted immediately drained 2.9 L of pink-tinged urine given IV vancomycin and cefepime nephrology was discussed and admitted after giving a dose of bicarb. Patient admitted on IV fluid hydration     Assessment and Plan: AKI  Metabolic acidosis 2/2 AKI Hyperkalemia 2/2 AKI-Secondary to postobstructive urinary retention, BPH.  -AKI resolved with IV fluids and Foley insertion. ARB discontinued, off IV fluids, kidney function has normalized -He was seen by urology, recommending Foley catheter at discharge.  He was started on Flomax, voiding trial as an outpatient in 1 to 2 weeks Renal ultrasound--negative for hydronephrosis but showed urinary bladder mass   Acute urinary retention -Foley catheter inserted in the ED as discussed above -Patient will maintain Foley catheter at the time of discharge -Follow-up in the office with Dr. 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   Asymptomatic.  He was started on molnupiravir for total of 5 days    Mildly elevated troponin  -flat suspect in the setting of severe AKI/demand ischemia.   -No chest pain   A-fib-type unspecified - rate  controlled on Cardizem not on anticoagulation,  - previously on AC 2-3 years ago, but he stopped due to his concerns for bleeding and cost - currently does not want to go back on [facility] but agreeable to ASA Advised op fu with cards and pcp.    Essential hypertension- -continue Cardizem CD   Class I Obesity -Patient's Body mass index is 32.55 kg/m.  -lifestyle modification   Left shoulder surgery on 9/24 -for left shoulder fracture which he sustained after he fell while walking on 02/17/2022.   S/p left reverse total shoulder arthroplasty Surgery/dressing placed.   Orthopedic surgeon was aware-they will follow-up as outpatient.        Family Communication:   no Family at bedside   Consultants:  urology   Code Status:  FULL   DVT Prophylaxis:  [place] Heparin      Procedures: As Listed in Progress Note Above   Antibiotics: None             Subjective: Patient denies fevers, chills, headache, chest pain, dyspnea, nausea, vomiting, diarrhea, abdominal pain, dysuria, hematuria, hematochezia, and melena.   Objective: Vitals:   03/20/22 1320 03/20/22 2104 03/21/22 0459 03/21/22 1252  BP: 130/77 128/79 139/83 120/74  Pulse: 87 71 78 94  Resp: '17 17 17 17  '$ Temp: 98.9 F (37.2 C) 98 F (36.7 C) 98 F (36.7 C) 98.3 F (36.8 C)  TempSrc:  Oral Oral Oral  SpO2: 100% 100% 99% 100%  Weight:      Height:        Intake/Output Summary (Last 24 hours) at 03/21/2022 1614 Last  data filed at 03/21/2022 1300 Gross per 24 hour  Intake 840 ml  Output 2450 ml  Net -1610 ml   Weight change:  Exam:  General:  Pt is alert, follows commands appropriately, not in acute distress HEENT: No icterus, No thrush, No neck mass, [place]/AT Cardiovascular: RRR, S1/S2, no rubs, no gallops Respiratory: CTA bilaterally, no wheezing, no crackles, no rhonchi Abdomen: Soft/+BS, non tender, non distended, no guarding Extremities: No edema, No lymphangitis, No petechiae, No rashes, no  synovitis   Data Reviewed: I have personally reviewed following labs and imaging studies Basic Metabolic Panel: Recent Labs  Lab 03/15/22 0715 03/16/22 0431 03/17/22 0422 03/18/22 0503  NA 142 143 140 139  K 4.5 4.6 4.3 3.8  CL 121* 117* 112* 111  CO2 15* 18* 18* 20*  GLUCOSE 95 96 108* 98  BUN 75* 38* 21 17  CREATININE 3.37* 1.24 0.91 0.80  CALCIUM 7.4* 8.1* 8.2* 8.2*   Liver Function Tests: No results for input(s): "AST", "ALT", "ALKPHOS", "BILITOT", "PROT", "ALBUMIN" in the last 168 hours. No results for input(s): "LIPASE", "AMYLASE" in the last 168 hours. No results for input(s): "AMMONIA" in the last 168 hours. Coagulation Profile: No results for input(s): "INR", "PROTIME" in the last 168 hours. CBC: Recent Labs  Lab 03/16/22 0431 03/17/22 0422 03/18/22 0503 03/19/22 0520 03/20/22 0621  WBC 8.4 9.0 9.3 9.7 10.0  HGB 10.6* 10.7* 10.7* 11.0* 12.0*  HCT 31.7* 31.8* 31.8* 32.0* 35.2*  MCV 97.5 95.8 97.0 95.0 95.4  PLT 163 180 178 180 140*   Cardiac Enzymes: Recent Labs  Lab 03/14/22 1801  CKTOTAL 49   BNP: Invalid input(s): "POCBNP" CBG: No results for input(s): "GLUCAP" in the last 168 hours. HbA1C: No results for input(s): "HGBA1C" in the last 72 hours. Urine analysis:    Component Value Date/Time   COLORURINE AMBER (A) 03/14/2022 1309   APPEARANCEUR HAZY (A) 03/14/2022 1309   LABSPEC 1.010 03/14/2022 1309   PHURINE 6.0 03/14/2022 1309   GLUCOSEU NEGATIVE 03/14/2022 1309   HGBUR LARGE (A) 03/14/2022 1309   BILIRUBINUR NEGATIVE 03/14/2022 1309   KETONESUR NEGATIVE 03/14/2022 1309   PROTEINUR 100 (A) 03/14/2022 1309   NITRITE NEGATIVE 03/14/2022 1309   LEUKOCYTESUR LARGE (A) 03/14/2022 1309   Sepsis Labs: '@LABRCNTIP'$ (procalcitonin:4,lacticidven:4) ) Recent Results (from the past 240 hour(s))  Resp Panel by RT-PCR (Flu A&B, Covid) Anterior Nasal Swab     Status: None   Collection Time: 03/14/22  1:39 PM   Specimen: Anterior Nasal Swab  Result  Value Ref Range Status   SARS Coronavirus 2 by RT PCR NEGATIVE NEGATIVE Final    Comment: (NOTE) SARS-CoV-2 target nucleic acids are NOT DETECTED.  The SARS-CoV-2 RNA is generally detectable in upper respiratory specimens during the acute phase of infection. The lowest concentration of SARS-CoV-2 viral copies this assay can detect is 138 copies/mL. A negative result does not preclude SARS-Cov-2 infection and should not be used as the sole basis for treatment or other patient management decisions. A negative result may occur with  improper specimen collection/handling, submission of specimen other than nasopharyngeal swab, presence of viral mutation(s) within the areas targeted by this assay, and inadequate number of viral copies(<138 copies/mL). A negative result must be combined with clinical observations, patient history, and epidemiological information. The expected result is Negative.  Fact Sheet for Patients:  EntrepreneurPulse.com.au  Fact Sheet for Healthcare Providers:  IncredibleEmployment.be  This test is no t yet approved or cleared by the [place] and  has been authorized for detection and/or diagnosis of SARS-CoV-2 by FDA under an Emergency Use Authorization (EUA). This EUA will remain  in effect (meaning this test can be used) for the duration of the COVID-19 declaration under Section 564(b)(1) of the Act, 21 U.S.C.section 360bbb-3(b)(1), unless the authorization is terminated  or revoked sooner.       Influenza A by PCR NEGATIVE NEGATIVE Final   Influenza B by PCR NEGATIVE NEGATIVE Final    Comment: (NOTE) The Xpert Xpress SARS-CoV-2/FLU/RSV plus assay is intended as an aid in the diagnosis of influenza from Nasopharyngeal swab specimens and should not be used as a sole basis for treatment. Nasal washings and aspirates are unacceptable for Xpert Xpress SARS-CoV-2/FLU/RSV testing.  Fact Sheet for  Patients: EntrepreneurPulse.com.au  Fact Sheet for Healthcare Providers: IncredibleEmployment.be  This test is not yet approved or cleared by the [place] FDA and has been authorized for detection and/or diagnosis of SARS-CoV-2 by FDA under an Emergency Use Authorization (EUA). This EUA will remain in effect (meaning this test can be used) for the duration of the COVID-19 declaration under Section 564(b)(1) of the Act, 21 U.S.C. section 360bbb-3(b)(1), unless the authorization is terminated or revoked.  Performed at [facility], [address], [place], [place] [address]   Blood culture (routine x 2)     Status: None   Collection Time: 03/14/22  2:39 PM   Specimen: BLOOD  Result Value Ref Range Status   Specimen Description BLOOD RIGHT ANTECUBITAL  Final   Special Requests   Final    BOTTLES DRAWN AEROBIC AND ANAEROBIC Blood Culture adequate volume   Culture   Final    NO GROWTH 5 DAYS Performed at [facility], [address], [place], [place] [address]    Report Status 03/19/2022 FINAL  Final  Blood culture (routine x 2)     Status: None   Collection Time: 03/14/22  2:39 PM   Specimen: BLOOD  Result Value Ref Range Status   Specimen Description BLOOD BLOOD RIGHT HAND  Final   Special Requests   Final    BOTTLES DRAWN AEROBIC AND ANAEROBIC Blood Culture adequate volume   Culture   Final    NO GROWTH 5 DAYS Performed at [facility], [address], [place], [place] [address]    Report Status 03/19/2022 FINAL  Final  MRSA Next Gen by PCR, Nasal     Status: None   Collection Time: 03/14/22  4:07 PM   Specimen: Nasal Mucosa; Nasal Swab  Result Value Ref Range Status   MRSA by PCR Next Gen NOT DETECTED NOT DETECTED Final    Comment: (NOTE) The GeneXpert MRSA Assay (FDA approved for NASAL specimens only), is one component of a comprehensive MRSA colonization surveillance program. It is not intended to diagnose MRSA infection nor to  guide or monitor treatment for MRSA infections. Test performance is not FDA approved in patients less than 48 years old. Performed at [facility], [address] [place], [place] [address]   SARS Coronavirus 2 by RT PCR (hospital order, performed in [facility] hospital lab) *cepheid single result test* Anterior Nasal Swab     Status: Abnormal   Collection Time: 03/17/22  3:46 PM   Specimen: Anterior Nasal Swab  Result Value Ref Range Status   SARS Coronavirus 2 by RT PCR POSITIVE (A) NEGATIVE Final    Comment: (NOTE) SARS-CoV-2 target nucleic acids are DETECTED  SARS-CoV-2 RNA is generally detectable in upper respiratory specimens  during the  acute phase of infection.  Positive results are indicative  of the presence of the identified virus, but do not rule out bacterial infection or co-infection with other pathogens not detected by the test.  Clinical correlation with patient history and  other diagnostic information is necessary to determine patient infection status.  The expected result is negative.  Fact Sheet for Patients:   https://www.patel.info/   Fact Sheet for Healthcare Providers:   https://hall.com/    This test is not yet approved or cleared by the [place] FDA and  has been authorized for detection and/or diagnosis of SARS-CoV-2 by FDA under an Emergency Use Authorization (EUA).  This EUA will remain in effect (meaning this test can be used) for the duration of  the COVID-19 declaration under Section 564(b)(1)  of the Act, 21 U.S.C. section 360-bbb-3(b)(1), unless the authorization is terminated or revoked sooner.   Performed at [facility], [address], [place], [place] [address]      Scheduled Meds:  Chlorhexidine Gluconate Cloth  6 each Topical Q0600   diltiazem  120 mg Oral Daily   heparin  5,000 Units Subcutaneous Q8H   hydrocortisone   Rectal BID   molnupiravir EUA  4 capsule Oral BID   tamsulosin   0.4 mg Oral QPC supper   Continuous Infusions:  Procedures/Studies: DG Chest Port 1 View  Result Date: 03/16/2022 CLINICAL DATA:  Shortness of breath. EXAM: PORTABLE CHEST 1 VIEW COMPARISON:  Chest x-ray dated March 14, 2022 FINDINGS: The heart size and mediastinal contours are within normal limits. Linear nodular opacity of the left lower lung, not seen on prior exam. Lungs otherwise clear. Prior reverse left total shoulder arthroplasty. IMPRESSION: Linear nodular opacity of the left lower lung, possibly new or not visualized due to differences in angulation. Differential considerations include pulmonary nodule or new focus of atelectasis. Recommend attention on follow-up, if nodular opacity does not resolve, CT would be indicated for further evaluation. Electronically Signed   By: Yetta Glassman M.D.   On: 03/16/2022 13:50   US Renal  Result Date: 03/15/2022 CLINICAL DATA:  Acute kidney injury. EXAM: RENAL / URINARY TRACT ULTRASOUND COMPLETE COMPARISON:  None Available. FINDINGS: Right Kidney: Renal measurements: 12.6 x 7.6 x 6.1 cm = volume: 305 mL. Echogenicity within normal limits. No mass or hydronephrosis visualized. Left Kidney: Renal measurements: 13.1 x 7.9 x 6.6 cm = volume: 358 mL. Echogenicity within normal limits. No mass or hydronephrosis visualized. Bladder: There appears to be a solid mass in the urinary bladder measuring 6.2 x 5.5 x 2.9 cm. Other: None. IMPRESSION: Kidneys are unremarkable. However, there appears to be a solid mass in the urinary bladder measuring 6.2 x 5.5 x 2.9 cm. Further evaluation with CT scan or cystoscopy is recommended. These results will be called to the ordering clinician or representative by the Radiologist Assistant, and communication documented in the PACS or zVision Dashboard. Electronically Signed   By: Marijo Conception M.D.   On: 03/15/2022 13:17   DG Chest Port 1 View  Result Date: 03/14/2022 CLINICAL DATA:  Fatigue. EXAM: PORTABLE CHEST 1 VIEW  COMPARISON:  None Available. FINDINGS: Mildly enlarged cardiac silhouette. Patchy retrocardiac airspace opacity in the left lower lobe with probable small left pleural effusion. Right lung is clear. No pneumothorax. Left total shoulder arthroplasty. No acute osseous abnormality. IMPRESSION: Patchy retrocardiac airspace opacity in the left lower lobe with probable small left pleural effusion, which could reflect atelectasis versus pneumonia in the appropriate clinical context. Electronically  Signed   By: Ileana Roup M.D.   On: 03/14/2022 14:22   DG Shoulder Left Port  Result Date: 03/07/2022 CLINICAL DATA:  Status post reverse shoulder arthroplasty. EXAM: LEFT SHOULDER COMPARISON:  February 18, 2022 FINDINGS: Post total left shoulder arthroplasty with normal alignment of the orthopedic hardware. Redemonstrated is left humeral neck comminuted fracture. IMPRESSION: 1. Post total left shoulder arthroplasty without evidence of immediate complications. 2. Left humeral neck comminuted fracture. Electronically Signed   By: Fidela Salisbury M.D.   On: 03/07/2022 14:27   CT SHOULDER LEFT WO CONTRAST  Result Date: 02/26/2022 CLINICAL DATA:  Fall on 02/15/2022.  Left proximal humeral fracture. EXAM: CT OF THE UPPER LEFT EXTREMITY WITHOUT CONTRAST TECHNIQUE: Multidetector CT imaging of the upper left extremity was performed according to the standard protocol. RADIATION DOSE REDUCTION: This exam was performed according to the departmental dose-optimization program which includes automated exposure control, adjustment of the mA and/or kV according to patient size and/or use of iterative reconstruction technique. COMPARISON:  Radiograph dated February 18, 2022 FINDINGS: Bones/Joint/Cartilage There is a comminuted impaction fracture of the left proximal humerus involving the femoral neck, head, greater and lesser tuberosities. There is approximately 2 cm superior displacement of the humeral neck/shaft. There is  approximately 1.5 cm posterior displacement of the humeral head. The fracture extends into the articular surface of the humeral head. No appreciable fracture of the scapula. No appreciable rib fracture. Mild arthritic changes of the acromioclavicular joint and glenohumeral joints. Ligaments Suboptimally assessed by CT. Muscles and Tendons Soft tissue swelling and edema about the comminuted fracture. Muscles and tendons appear intact. Soft tissues No significant hematoma or fluid collection. IMPRESSION: 1. Comminuted impaction fracture of the left proximal humerus involving femoral neck, head, greater and lesser tuberosities with 2 cm superior displacement of the humeral neck as well as 1.5 cm posterior displacement of the humeral head. The findings likely represent 3 part fracture (Neer classification). 2. Mild arthritic changes of the acromioclavicular and glenohumeral joints. 3.  Soft tissue swelling and edema about the comminuted fracture. Electronically Signed   By: Keane Police D.O.   On: 02/26/2022 17:58    Orson Eva, DO  [facility]  If 7PM-7AM, please contact night-coverage [email or web address] Password [email or web address] 03/21/2022, 4:14 PM   LOS: 7 days

## 2022-03-22 ENCOUNTER — Other Ambulatory Visit: Payer: Self-pay | Admitting: Adult Health

## 2022-03-22 ENCOUNTER — Inpatient Hospital Stay (HOSPITAL_COMMUNITY): Payer: Medicare HMO

## 2022-03-22 DIAGNOSIS — J189 Pneumonia, unspecified organism: Secondary | ICD-10-CM | POA: Diagnosis not present

## 2022-03-22 DIAGNOSIS — R768 Other specified abnormal immunological findings in serum: Secondary | ICD-10-CM | POA: Diagnosis not present

## 2022-03-22 DIAGNOSIS — F5101 Primary insomnia: Secondary | ICD-10-CM | POA: Diagnosis not present

## 2022-03-22 DIAGNOSIS — R338 Other retention of urine: Secondary | ICD-10-CM | POA: Diagnosis not present

## 2022-03-22 DIAGNOSIS — R4782 Fluency disorder in conditions classified elsewhere: Secondary | ICD-10-CM | POA: Diagnosis not present

## 2022-03-22 DIAGNOSIS — I4821 Permanent atrial fibrillation: Secondary | ICD-10-CM | POA: Diagnosis not present

## 2022-03-22 DIAGNOSIS — R262 Difficulty in walking, not elsewhere classified: Secondary | ICD-10-CM | POA: Diagnosis not present

## 2022-03-22 DIAGNOSIS — R498 Other voice and resonance disorders: Secondary | ICD-10-CM | POA: Diagnosis not present

## 2022-03-22 DIAGNOSIS — D72829 Elevated white blood cell count, unspecified: Secondary | ICD-10-CM | POA: Diagnosis not present

## 2022-03-22 DIAGNOSIS — Z4789 Encounter for other orthopedic aftercare: Secondary | ICD-10-CM | POA: Diagnosis not present

## 2022-03-22 DIAGNOSIS — Z96612 Presence of left artificial shoulder joint: Secondary | ICD-10-CM | POA: Diagnosis not present

## 2022-03-22 DIAGNOSIS — R0609 Other forms of dyspnea: Secondary | ICD-10-CM | POA: Diagnosis not present

## 2022-03-22 DIAGNOSIS — E785 Hyperlipidemia, unspecified: Secondary | ICD-10-CM | POA: Diagnosis not present

## 2022-03-22 DIAGNOSIS — Z8781 Personal history of (healed) traumatic fracture: Secondary | ICD-10-CM | POA: Diagnosis not present

## 2022-03-22 DIAGNOSIS — I4891 Unspecified atrial fibrillation: Secondary | ICD-10-CM | POA: Diagnosis not present

## 2022-03-22 DIAGNOSIS — E669 Obesity, unspecified: Secondary | ICD-10-CM | POA: Diagnosis not present

## 2022-03-22 DIAGNOSIS — K5909 Other constipation: Secondary | ICD-10-CM | POA: Diagnosis not present

## 2022-03-22 DIAGNOSIS — M1000 Idiopathic gout, unspecified site: Secondary | ICD-10-CM | POA: Diagnosis not present

## 2022-03-22 DIAGNOSIS — N401 Enlarged prostate with lower urinary tract symptoms: Secondary | ICD-10-CM | POA: Diagnosis not present

## 2022-03-22 DIAGNOSIS — N179 Acute kidney failure, unspecified: Secondary | ICD-10-CM | POA: Diagnosis not present

## 2022-03-22 DIAGNOSIS — Z978 Presence of other specified devices: Secondary | ICD-10-CM | POA: Diagnosis not present

## 2022-03-22 DIAGNOSIS — E782 Mixed hyperlipidemia: Secondary | ICD-10-CM | POA: Diagnosis not present

## 2022-03-22 DIAGNOSIS — R339 Retention of urine, unspecified: Secondary | ICD-10-CM | POA: Diagnosis not present

## 2022-03-22 DIAGNOSIS — S42352A Displaced comminuted fracture of shaft of humerus, left arm, initial encounter for closed fracture: Secondary | ICD-10-CM | POA: Diagnosis not present

## 2022-03-22 DIAGNOSIS — Z96642 Presence of left artificial hip joint: Secondary | ICD-10-CM | POA: Diagnosis not present

## 2022-03-22 DIAGNOSIS — Z23 Encounter for immunization: Secondary | ICD-10-CM | POA: Diagnosis not present

## 2022-03-22 DIAGNOSIS — S42252A Displaced fracture of greater tuberosity of left humerus, initial encounter for closed fracture: Secondary | ICD-10-CM | POA: Diagnosis not present

## 2022-03-22 DIAGNOSIS — J309 Allergic rhinitis, unspecified: Secondary | ICD-10-CM | POA: Diagnosis not present

## 2022-03-22 DIAGNOSIS — Z9181 History of falling: Secondary | ICD-10-CM | POA: Diagnosis not present

## 2022-03-22 DIAGNOSIS — R7989 Other specified abnormal findings of blood chemistry: Secondary | ICD-10-CM | POA: Diagnosis not present

## 2022-03-22 DIAGNOSIS — J3089 Other allergic rhinitis: Secondary | ICD-10-CM | POA: Diagnosis not present

## 2022-03-22 DIAGNOSIS — E43 Unspecified severe protein-calorie malnutrition: Secondary | ICD-10-CM | POA: Diagnosis not present

## 2022-03-22 DIAGNOSIS — Z8616 Personal history of COVID-19: Secondary | ICD-10-CM | POA: Diagnosis not present

## 2022-03-22 DIAGNOSIS — M6281 Muscle weakness (generalized): Secondary | ICD-10-CM | POA: Diagnosis not present

## 2022-03-22 DIAGNOSIS — Z471 Aftercare following joint replacement surgery: Secondary | ICD-10-CM | POA: Diagnosis not present

## 2022-03-22 DIAGNOSIS — R8281 Pyuria: Secondary | ICD-10-CM | POA: Diagnosis not present

## 2022-03-22 DIAGNOSIS — S37009D Unspecified injury of unspecified kidney, subsequent encounter: Secondary | ICD-10-CM | POA: Diagnosis not present

## 2022-03-22 DIAGNOSIS — R69 Illness, unspecified: Secondary | ICD-10-CM | POA: Diagnosis not present

## 2022-03-22 DIAGNOSIS — I10 Essential (primary) hypertension: Secondary | ICD-10-CM | POA: Diagnosis not present

## 2022-03-22 DIAGNOSIS — N329 Bladder disorder, unspecified: Secondary | ICD-10-CM | POA: Diagnosis not present

## 2022-03-22 DIAGNOSIS — M1A00X0 Idiopathic chronic gout, unspecified site, without tophus (tophi): Secondary | ICD-10-CM | POA: Diagnosis not present

## 2022-03-22 LAB — COMPREHENSIVE METABOLIC PANEL
ALT: 55 U/L — ABNORMAL HIGH (ref 0–44)
AST: 41 U/L (ref 15–41)
Albumin: 2.6 g/dL — ABNORMAL LOW (ref 3.5–5.0)
Alkaline Phosphatase: 127 U/L — ABNORMAL HIGH (ref 38–126)
Anion gap: 6 (ref 5–15)
BUN: 17 mg/dL (ref 8–23)
CO2: 23 mmol/L (ref 22–32)
Calcium: 8.6 mg/dL — ABNORMAL LOW (ref 8.9–10.3)
Chloride: 111 mmol/L (ref 98–111)
Creatinine, Ser: 0.88 mg/dL (ref 0.61–1.24)
GFR, Estimated: >60 mL/min (ref >60)
Glucose, Bld: 103 mg/dL — ABNORMAL HIGH (ref 70–99)
Potassium: 3.9 mmol/L (ref 3.5–5.1)
Sodium: 140 mmol/L (ref 135–145)
Total Bilirubin: 0.9 mg/dL (ref 0.3–1.2)
Total Protein: 5.2 g/dL — ABNORMAL LOW (ref 6.5–8.1)

## 2022-03-22 LAB — CBC
HCT: 32.4 % — ABNORMAL LOW (ref 39.0–52.0)
Hemoglobin: 11 g/dL — ABNORMAL LOW (ref 13.0–17.0)
MCH: 32.3 pg (ref 26.0–34.0)
MCHC: 34 g/dL (ref 30.0–36.0)
MCV: 95 fL (ref 80.0–100.0)
Platelets: 184 10*3/uL (ref 150–400)
RBC: 3.41 MIL/uL — ABNORMAL LOW (ref 4.22–5.81)
RDW: 12.9 % (ref 11.5–15.5)
WBC: 9.3 10*3/uL (ref 4.0–10.5)
nRBC: 0 % (ref 0.0–0.2)

## 2022-03-22 MED ORDER — TRAMADOL HCL 50 MG PO TABS: 50.0000 mg | ORAL_TABLET | ORAL | 0 refills | Status: AC | PRN

## 2022-03-22 MED ORDER — ASPIRIN 81 MG PO TBEC: 81.0000 mg | DELAYED_RELEASE_TABLET | Freq: Every day | ORAL | 12 refills | Status: DC

## 2022-03-22 NOTE — Progress Notes (Signed)
Report given to the [facility] center

## 2022-03-22 NOTE — Care Management Important Message (Signed)
Important Message  Patient Details  Name: Geoffrey Patterson MRN: [id number] Date of Birth: Aug 25, 1950   Medicare Important Message Given:  Yes (spoke with by phone at [phone], no additional copy needed)     Tommy Medal 03/22/2022, 10:39 AM

## 2022-03-22 NOTE — TOC Transition Note (Signed)
Transition of Care [facility]) - CM/SW Discharge Note   Patient Details  Name: Geoffrey Patterson MRN: [id number] Date of Birth: 04/25/1951  [facility] of Care [facility] CM/SW Contact:  Boneta Lucks, RN Phone Number: 03/22/2022, 10:17 AM   Clinical Narrative:   [facility] has a bed and ready for patient to admit today. RN to call report. His sister is at [facility] with [name], bringing belongings. Dc summary sent in the hub.    Final next level of care: Skilled Nursing Facility Barriers to Discharge: Barriers Resolved   Patient Goals and CMS Choice Patient states their goals for this hospitalization and ongoing recovery are:: agreeable to SNF CMS Medicare.gov Compare Post Acute Care list provided to:: Patient Choice offered to / list presented to : Patient  Discharge Placement              Patient chooses bed at:  [facility]) Patient to be transferred to facility by: [facility] staff Name of family member notified: Sister is at [facility] with his belongings Patient and family notified of of transfer: 03/22/22  Discharge Plan and Services      Readmission Risk Interventions    03/17/2022    3:40 PM  Readmission Risk Prevention Plan  Post Dischage Appt Complete  Medication Screening Complete  Transportation Screening Complete

## 2022-03-23 ENCOUNTER — Encounter: Payer: Self-pay | Admitting: Adult Health

## 2022-03-23 ENCOUNTER — Non-Acute Institutional Stay (SKILLED_NURSING_FACILITY): Payer: Medicare HMO | Admitting: Adult Health

## 2022-03-23 DIAGNOSIS — E785 Hyperlipidemia, unspecified: Secondary | ICD-10-CM | POA: Insufficient documentation

## 2022-03-23 DIAGNOSIS — J3089 Other allergic rhinitis: Secondary | ICD-10-CM | POA: Diagnosis not present

## 2022-03-23 DIAGNOSIS — N179 Acute kidney failure, unspecified: Secondary | ICD-10-CM | POA: Diagnosis not present

## 2022-03-23 DIAGNOSIS — E43 Unspecified severe protein-calorie malnutrition: Secondary | ICD-10-CM

## 2022-03-23 DIAGNOSIS — R338 Other retention of urine: Secondary | ICD-10-CM

## 2022-03-23 DIAGNOSIS — M1A00X0 Idiopathic chronic gout, unspecified site, without tophus (tophi): Secondary | ICD-10-CM | POA: Diagnosis not present

## 2022-03-23 DIAGNOSIS — Z8781 Personal history of (healed) traumatic fracture: Secondary | ICD-10-CM | POA: Diagnosis not present

## 2022-03-23 DIAGNOSIS — M1A9XX0 Chronic gout, unspecified, without tophus (tophi): Secondary | ICD-10-CM | POA: Insufficient documentation

## 2022-03-23 DIAGNOSIS — I10 Essential (primary) hypertension: Secondary | ICD-10-CM

## 2022-03-23 DIAGNOSIS — I4821 Permanent atrial fibrillation: Secondary | ICD-10-CM

## 2022-03-23 DIAGNOSIS — Z96612 Presence of left artificial shoulder joint: Secondary | ICD-10-CM

## 2022-03-23 DIAGNOSIS — K5909 Other constipation: Secondary | ICD-10-CM

## 2022-03-23 NOTE — Progress Notes (Signed)
Location:  [place] Room Number: 133-P Place of Service:  SNF (31)   CODE STATUS: Full Code  No Known Allergies  Chief Complaint  Patient presents with   Hospitalization Follow-up    HPI:  He is a 70 year old man who has been hospitalized from 03-14-22 through 03-22-22. His medical history includes: gout; hypertension; hyperlipidemia. He had a fall in September suffering a left shoulder fracture. He had a left shoulder arthoplasty on 03-07-22. He was discharged to home. Since that time he has had increased weakness and poor po intake. He presented to the ED due to his decline in his status.  AKI/ metabolic acidosis due to renal failure / hyperkalemia  secondary to postobstructive urine retention. He did receive IVF and had a foley insertion. He was seen by urology who recommended to be discharged with foley and to follow up one week after discharge for a voiding trial and bladder mass. His renal ultrasound found a solid bladder mass. His renal function has returned to normal  He did have incidental COVID positive. He was started on molnupiravir for 5 days.      He is here for short term rehab with his goal to return back home. He denies any pain. His appetite is improving. There are no reports of fevers present. He will continue to be followed for his chronic illnesses including:   . 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:    Past Medical History:  Diagnosis Date   Dysrhythmia    a-fib history   Gout    Hypertension     Past Surgical History:  Procedure Laterality Date   COLONOSCOPY     COLONOSCOPY N/A 12/30/2016   Procedure: COLONOSCOPY;  Surgeon: Rogene Houston, MD;  [facility];  Service: Endoscopy;  Laterality: N/A;  1030   POLYPECTOMY  12/30/2016   Procedure: POLYPECTOMY;  Surgeon: Rogene Houston, MD;   [facility];  Service: Endoscopy;;  colon    REVERSE SHOULDER ARTHROPLASTY Left 03/07/2022   Procedure: REVERSE SHOULDER ARTHROPLASTY;  Surgeon: Marchia Bond, MD;  Location: [place];  Service: Orthopedics;  Laterality: Left;   TONSILLECTOMY      Social History   Socioeconomic History   Marital status: Legally Separated    Spouse name: Not on file   Number of children: Not on file   Years of education: Not on file   Highest education level: Not on file  Occupational History   Not on file  Tobacco Use   Smoking status: Former    Types: Cigarettes   Smokeless tobacco: Never  Vaping Use   Vaping Use: Never used  Substance and Sexual Activity   Alcohol use: No   Drug use: No   Sexual activity: Never  Other Topics Concern   Not on file  Social History Narrative   Not on file   Social Determinants of Health   Financial Resource Strain: Not on file  Food Insecurity: No Food Insecurity (03/16/2022)   Hunger Vital Sign    Worried About Running Out of Food in the Last Year: Never true    Ran Out of Food in the Last Year: Never true  Transportation Needs: No Transportation Needs (03/16/2022)   PRAPARE - Hydrologist (Medical): No    Lack of Transportation (Non-Medical): No  Physical Activity: Not on  file  Stress: Not on file  Social Connections: Not on file  Intimate Partner Violence: Not At Risk (03/16/2022)   Humiliation, Afraid, Rape, and Kick questionnaire    Fear of Current or Ex-Partner: No    Emotionally Abused: No    Physically Abused: No    Sexually Abused: No   Family History  Problem Relation Age of Onset   Hypertension Mother    Hypertension Father    Diabetic kidney disease Father       VITAL SIGNS BP 116/79   Pulse 84   Temp (!) 97.5 F (36.4 C)   Resp 20   Ht 6' (1.829 m)   Wt 229 lb (103.9 kg)   SpO2 100%   BMI 31.06 kg/m   Outpatient Encounter Medications as of 03/23/2022  Medication Sig    allopurinol (ZYLOPRIM) 300 MG tablet Take 300 mg by mouth daily.   diltiazem (CARDIZEM CD) 120 MG 24 hr capsule Take 120 mg by mouth daily.   fexofenadine (ALLEGRA) 180 MG tablet Take 180 mg by mouth daily.   rosuvastatin (CRESTOR) 5 MG tablet Take 5 mg by mouth daily.   sennosides-docusate sodium (SENOKOT-S) 8.6-50 MG tablet Take 2 tablets by mouth daily.   tamsulosin (FLOMAX) 0.4 MG CAPS capsule Take 1 capsule (0.4 mg total) by mouth daily after supper.   traMADol (ULTRAM) 50 MG tablet Take 1 tablet (50 mg total) by mouth every 4 (four) hours as needed for up to 4 days.   valACYclovir (VALTREX) 1000 MG tablet Take 2,000 mg by mouth 2 (two) times daily as needed (fever blisters).   aspirin EC (ASPIRIN 81) 81 MG tablet Take 1 tablet (81 mg total) by mouth daily. Swallow whole. (Patient not taking: Reported on 03/23/2022)   ondansetron (ZOFRAN) 4 MG tablet Take 1 tablet (4 mg total) by mouth every 8 (eight) hours as needed for nausea or vomiting. (Patient not taking: Reported on 03/23/2022)   No facility-administered encounter medications on file as of 03/23/2022.     SIGNIFICANT DIAGNOSTIC EXAMS  TODAY  03-14-22: chest x-ray: Patchy retrocardiac airspace opacity in the left lower lobe with probable small left pleural effusion, which could reflect atelectasis versus pneumonia in the appropriate clinical context.  03-15-22: renal ultrasound: Kidneys are unremarkable. However, there appears to be a solid mass in the urinary bladder measuring 6.2 x 5.5 x 2.9 cm. Further evaluation with CT scan or cystoscopy is recommended.   LABS REVIEWED TODAY;   03-14-22: wbc 14.1; hgb 12.2; ht 34.2; mcv 93.2 plt 165; glucose 116; bun 160; creat 12.49; k+ 5.9; na++ 131; ca 8.0; gfr 4; protein 5.9; albumin 2.7; blood culture: no growth 03-16-22: wbc 8.4; hgb 10.6; hct 31.7; mcv 97.5 plt 164; glucose 108; bun 21; creat 0.91; k+ 4.3; na++ 140; ca 8.2 gfr >60 03-22-22: wbc 9.3; hgb 11.0; hct 32.4; mcv 95.0 plt 184;  glucose 103; bun 17; creat 0.88; k+ 3.9; na++ 14; ca 8.6; gfr >60; protein 5.2; albumin 2.6 alt 55; alk phos 127   Review of Systems  Constitutional:  Negative for malaise/fatigue.  Respiratory:  Negative for cough and shortness of breath.   Cardiovascular:  Negative for chest pain, palpitations and leg swelling.  Gastrointestinal:  Negative for abdominal pain, constipation and heartburn.  Musculoskeletal:  Negative for back pain, joint pain and myalgias.  Skin: Negative.   Neurological:  Negative for dizziness.  Psychiatric/Behavioral:  The patient is not nervous/anxious.    Physical Exam Constitutional:  General: He is not in acute distress.    Appearance: He is well-developed. He is obese. He is not diaphoretic.  Neck:     Thyroid: No thyromegaly.  Cardiovascular:     Rate and Rhythm: Normal rate and regular rhythm.     Pulses: Normal pulses.     Heart sounds: Normal heart sounds.  Pulmonary:     Effort: Pulmonary effort is normal. No respiratory distress.     Breath sounds: Normal breath sounds.  Abdominal:     General: Bowel sounds are normal. There is no distension.     Palpations: Abdomen is soft.     Tenderness: There is no abdominal tenderness.  Genitourinary:    Comments: Foley scrotal edema; has had long term from a hernia  Musculoskeletal:        General: Normal range of motion.     Cervical back: Neck supple.     Comments: 03-07-22: left shoulder arthoplasty from fracture   Lymphadenopathy:     Cervical: No cervical adenopathy.  Skin:    General: Skin is warm and dry.  Neurological:     Mental Status: He is alert and oriented to person, place, and time.  Psychiatric:        Mood and Affect: Mood normal.      ASSESSMENT/ PLAN:  TODAY  AKI (acute kidney injury) upon admission creat 12.49; did receive IVF creat is now at baseline 0.88  2. Acute urinary retention: did in/out cath with over 2 liter return; now with a foley will need to follow up with  urology in the next week; also for bladder mass. Is on flomax 0.4 mg daily   3. Permanent atrial fibrillation: heart rate is stable cardizem cd 120 mg daily for rate control; is on asa 81 mg daily   4. Essential hypertension: b/p 116/79 will continue cardizem cd 120 mg daily   5. History of left shoulder fracture/s/p shoulder arthroplasty: has ultram 50 mg every 4 hours as needed for 4 more days  6. 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 Non-seasonal allergic rhinitis unspecified trigger: will continue allegra 180 mg daily   8. Chronic constipation: will continue senna s 2 tabs daily   9. Severe protein calorie malnutrition: protein 5.2 albumin 2.6 will begin prostat 30 mL with meals  10. Hyperlipidemia LDL goal <100: will continue crestor 5 mg daily     Ok Edwards NP [facility]   call [phone]

## 2022-03-24 ENCOUNTER — Non-Acute Institutional Stay (SKILLED_NURSING_FACILITY): Payer: Medicare HMO | Admitting: Internal Medicine

## 2022-03-24 ENCOUNTER — Encounter: Payer: Self-pay | Admitting: Internal Medicine

## 2022-03-24 DIAGNOSIS — N179 Acute kidney failure, unspecified: Secondary | ICD-10-CM | POA: Diagnosis not present

## 2022-03-24 DIAGNOSIS — R338 Other retention of urine: Secondary | ICD-10-CM

## 2022-03-24 DIAGNOSIS — R768 Other specified abnormal immunological findings in serum: Secondary | ICD-10-CM

## 2022-03-24 DIAGNOSIS — I10 Essential (primary) hypertension: Secondary | ICD-10-CM

## 2022-03-24 DIAGNOSIS — R7689 Other specified abnormal immunological findings in serum: Secondary | ICD-10-CM | POA: Insufficient documentation

## 2022-03-24 NOTE — Patient Instructions (Signed)
See assessment and plan under each diagnosis in the problem list and acutely for this visit 

## 2022-03-24 NOTE — Assessment & Plan Note (Addendum)
Urology outpatient follow-up with Dr. Alyson Ingles for voiding trial.

## 2022-03-24 NOTE — Assessment & Plan Note (Signed)
Severe AKI reversed with final creatinine of 0.88 with GFR greater than 60.  Med list reviewed; no change indicated.

## 2022-03-24 NOTE — Assessment & Plan Note (Signed)
Patient afebrile with normal O2 sats on room air.

## 2022-03-24 NOTE — Assessment & Plan Note (Signed)
BP controlled; no change in antihypertensive medications  

## 2022-03-24 NOTE — Progress Notes (Signed)
NURSING HOME LOCATION:  [facility] ROOM NUMBER:  [id number] P  CODE STATUS:  Full Code  PCP:  Celene Squibb MD  This is a comprehensive admission note to this SNFperformed on this date less than 30 days from date of admission. Included are preadmission medical/surgical history; reconciled medication list; family history; social history and comprehensive review of systems.  Corrections and additions to the records were documented. Comprehensive physical exam was also performed. Additionally a clinical summary was entered for each active diagnosis pertinent to this admission in the Problem List to enhance continuity of care.  HPI: He was hospitalized 10/1 - 03/22/2022, presenting to the ED with weakness and anorexia in the context of left shoulder surgery on 9/24 for fracture. Severe AKI was present with a creatinine of 12.49, BUN 160, and GFR of 4.  Telmisartan was held.  Leukocytosis with white count of 14,100 and left shift were present. Chest x-ray suggested patchy retrocardiac airspace opacity in the left lower lobe suggesting atelectasis or pneumonia.  Lactic acid level and procalcitonin were normal.  SARS coronavirus 2 by RT-PCR was negative 10/1.  EKG revealed atrial fibrillation.  Foley cath was inserted resulting in the drainage of 2.9 L of pink-tinged urine.  He was placed on empiric IV vancomycin and cefepime.  Urology consulted; there was a question of a possible bladder mass.  With IV fluid hydration there was progressive improvement in renal function.  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   He also presented with hyponatremia with a sodium of 131 which was corrected.  Potassium was 5.9 but also normalized.  There was mild progression of normochromic, normocytic anemia with H/H dropping from 12.2/34.2 down to 11/32.4.  Protein/caloric malnutrition was present with albumin of 2.6 and total protein of 5.2. PT/OT recommended SNF  placement for rehab.  As a prelude to placement the SARS RT-PCR was repeated 10/4 and was positive.  This was felt to be an incidental finding as he had no COVID-related symptoms.  He did receive 5 days of molnupiravir and airborne isolation was continued. Urology recommended discharge to the SNF with Foley in place with subsequent OP voiding trial.  Past medical and surgical history includes history of PAF, gout, essential hypertension, dyslipidemia, history of colon polyps, Surgeries and procedures include colonoscopy with polypectomy and reverse shoulder arthroplasty 03/07/2022.Marland Kitchen  Social history: Nondrinker; former smoker.  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  Family history: Reviewed   Review of systems: He states that he is doing "very well.".  When asked why had been in the hospital his response was "my kidney."  He states that he has been vaccinated x3 for COVID.  His only active complaint is that his voice is "weak." He states that he sustained a fracture when he became dizzy and fell.  He denies recurrent falls or any neurologic or cardiac prodrome prior to that fall.  Prior to the incident he stated he was going to the gym every other day and walking 1.5 miles per day.  Constitutional: No fever, significant weight change, fatigue  Eyes: No redness, discharge, pain, vision change ENT/mouth: No nasal congestion, purulent discharge, earache, change in hearing, sore throat  Cardiovascular: No chest pain, palpitations, paroxysmal nocturnal dyspnea, claudication, edema  Respiratory: No cough, sputum production, hemoptysis, DOE, significant snoring, apnea Gastrointestinal: No heartburn, dysphagia, abdominal pain, nausea /vomiting, rectal bleeding, melena, change in bowels Genitourinary: No dysuria,  hematuria, pyuria, incontinence, nocturia Musculoskeletal: No joint stiffness, joint swelling, weakness Dermatologic: No rash, pruritus, change in appearance of  skin Neurologic: No headache, syncope, seizures, numbness, tingling Psychiatric: No significant anxiety, depression, insomnia, anorexia Endocrine: No change in hair/skin/nails, excessive thirst, excessive hunger, excessive urination  Hematologic/lymphatic: No significant bruising, lymphadenopathy, abnormal bleeding Allergy/immunology: No itchy/watery eyes, significant sneezing, urticaria, angioedema  Physical exam:  Pertinent or positive findings: Initially he was asleep and exhibiting hypopnea without snoring or frank apnea.  Upon awakening facies were somewhat blank.  Hair is disheveled.  Ptosis is present on the left.  Arcus senilis is noted.  He has a mustache.  His dental care is immaculate.  He did exhibit a halting somewhat stammering speech pattern which was almost indiscernible at times.  Heart sounds are distant and rhythm is clinically irregular.  Breath sounds are decreased.  Foley catheter is present.  He has 1+ edema at the sock line.  Left upper extremity is in a sling.  He has slight DIP contracture changes of the left index finger.  He has isolated ecchymoses over the right antecubital area.  General appearance: Adequately nourished; no acute distress, increased work of breathing is present.   Lymphatic: No lymphadenopathy about the head, neck, axilla. Eyes: No conjunctival inflammation or lid edema is present. There is no scleral icterus. Ears:  External ear exam shows no significant lesions or deformities.   Nose:  External nasal examination shows no deformity or inflammation. Nasal mucosa are pink and moist without lesions, exudates Oral exam: Lips and gums are healthy appearing.There is no oropharyngeal erythema or exudate. Neck:  No thyromegaly, masses, tenderness noted.    Heart:  No gallop, murmur, click, rub.  Lungs: without wheezes, rhonchi, rales, rubs. Abdomen: Bowel sounds are normal.  Abdomen is soft and nontender with no organomegaly, hernias, masses. GU: Deferred   Extremities:  No cyanosis, clubbing Neurologic exam: Balance, Rhomberg, finger to nose testing could not be completed due to clinical state Skin: Warm & dry w/o tenting. No significant lesions or rash.  See clinical summary under each active problem in the Problem List with associated updated therapeutic plan

## 2022-03-29 ENCOUNTER — Ambulatory Visit: Payer: Medicare HMO | Admitting: Physician Assistant

## 2022-03-29 ENCOUNTER — Non-Acute Institutional Stay (SKILLED_NURSING_FACILITY): Payer: Medicare HMO | Admitting: Adult Health

## 2022-03-29 ENCOUNTER — Encounter: Payer: Self-pay | Admitting: Adult Health

## 2022-03-29 DIAGNOSIS — M1A00X0 Idiopathic chronic gout, unspecified site, without tophus (tophi): Secondary | ICD-10-CM

## 2022-03-29 DIAGNOSIS — F5101 Primary insomnia: Secondary | ICD-10-CM

## 2022-03-29 DIAGNOSIS — R69 Illness, unspecified: Secondary | ICD-10-CM | POA: Diagnosis not present

## 2022-03-29 NOTE — Progress Notes (Signed)
Location:  [place] Room Number: 133 Place of Service:  SNF (31)   CODE STATUS: full   No Known Allergies  Chief Complaint  Patient presents with   Acute Visit    Patient concerns     HPI:  He is concerned that he is not getting his gout medication. I did reassure him that he is taking his medications as prescribed and allopurinol is in his regimen. Staff report that he is expressing feelings of depression with anxiety. He denies any of those feelings. He is able to participate in therapy and has a good appetite. He denies any gouty pain. He is complaining of insomnia.   Past Medical History:  Diagnosis Date   Dysrhythmia    a-fib history   Gout    Hypertension     Past Surgical History:  Procedure Laterality Date   COLONOSCOPY     COLONOSCOPY N/A 12/30/2016   Procedure: COLONOSCOPY;  Surgeon: Rogene Houston, MD;  [facility];  Service: Endoscopy;  Laterality: N/A;  1030   POLYPECTOMY  12/30/2016   Procedure: POLYPECTOMY;  Surgeon: Rogene Houston, MD;  [facility];  Service: Endoscopy;;  colon    REVERSE SHOULDER ARTHROPLASTY Left 03/07/2022   Procedure: REVERSE SHOULDER ARTHROPLASTY;  Surgeon: Marchia Bond, MD;  Location: [place];  Service: Orthopedics;  Laterality: Left;   TONSILLECTOMY      Social History   Socioeconomic History   Marital status: Legally Separated    Spouse name: Not on file   Number of children: Not on file   Years of education: Not on file   Highest education level: Not on file  Occupational History   Not on file  Tobacco Use   Smoking status: Former    Types: Cigarettes   Smokeless tobacco: Never  Vaping Use   Vaping Use: Never used  Substance and Sexual Activity   Alcohol use: No   Drug use: No   Sexual activity: Never  Other Topics Concern   Not on file  Social History Narrative   Not on file   Social Determinants of Health   Financial Resource Strain: Not on file  Food  Insecurity: No Food Insecurity (03/16/2022)   Hunger Vital Sign    Worried About Running Out of Food in the Last Year: Never true    Ran Out of Food in the Last Year: Never true  Transportation Needs: No Transportation Needs (03/16/2022)   PRAPARE - Hydrologist (Medical): No    Lack of Transportation (Non-Medical): No  Physical Activity: Not on file  Stress: Not on file  Social Connections: Not on file  Intimate Partner Violence: Not At Risk (03/16/2022)   Humiliation, Afraid, Rape, and Kick questionnaire    Fear of Current or Ex-Partner: No    Emotionally Abused: No    Physically Abused: No    Sexually Abused: No   Family History  Problem Relation Age of Onset   Hypertension Mother    Hypertension Father    Diabetic kidney disease Father       VITAL SIGNS BP 131/60   Pulse 73   Temp 98 F (36.7 C)   Resp 18   Ht _0  (1.854 m)   Wt 229 lb 6.4 oz (104.1 kg)   SpO2 95%   BMI 30.27 kg/m   Outpatient Encounter Medications as of 03/29/2022  Medication Sig   allopurinol (ZYLOPRIM) 300 MG tablet  Take 300 mg by mouth daily.   Amino Acids-Protein Hydrolys (FEEDING SUPPLEMENT, PRO-STAT SUGAR FREE 64,) LIQD Take 30 mLs by mouth 3 (three) times daily with meals.   diltiazem (CARDIZEM CD) 120 MG 24 hr capsule Take 120 mg by mouth daily.   fexofenadine (ALLEGRA) 180 MG tablet Take 180 mg by mouth daily.   rosuvastatin (CRESTOR) 5 MG tablet Take 5 mg by mouth daily.   sennosides-docusate sodium (SENOKOT-S) 8.6-50 MG tablet Take 2 tablets by mouth daily.   tamsulosin (FLOMAX) 0.4 MG CAPS capsule Take 1 capsule (0.4 mg total) by mouth daily after supper.   valACYclovir (VALTREX) 1000 MG tablet Take 2,000 mg by mouth 2 (two) times daily as needed (fever blisters).   No facility-administered encounter medications on file as of 03/29/2022.     SIGNIFICANT DIAGNOSTIC EXAMS   PREVIOUS   03-14-22: chest x-ray: Patchy retrocardiac airspace opacity in the  left lower lobe with probable small left pleural effusion, which could reflect atelectasis versus pneumonia in the appropriate clinical context.  03-15-22: renal ultrasound: Kidneys are unremarkable. However, there appears to be a solid mass in the urinary bladder measuring 6.2 x 5.5 x 2.9 cm. Further evaluation with CT scan or cystoscopy is recommended.   NO NEW EXAMS   LABS REVIEWED PREVIOUS    03-14-22: wbc 14.1; hgb 12.2; ht 34.2; mcv 93.2 plt 165; glucose 116; bun 160; creat 12.49; k+ 5.9; na++ 131; ca 8.0; gfr 4; protein 5.9; albumin 2.7; blood culture: no growth 03-16-22: wbc 8.4; hgb 10.6; hct 31.7; mcv 97.5 plt 164; glucose 108; bun 21; creat 0.91; k+ 4.3; na++ 140; ca 8.2 gfr >60 03-22-22: wbc 9.3; hgb 11.0; hct 32.4; mcv 95.0 plt 184; glucose 103; bun 17; creat 0.88; k+ 3.9; na++ 14; ca 8.6; gfr >60; protein 5.2; albumin 2.6 alt 55; alk phos 127   NO NEW LABS   Review of Systems  Constitutional:  Negative for malaise/fatigue.  Respiratory:  Negative for cough and shortness of breath.   Cardiovascular:  Negative for chest pain, palpitations and leg swelling.  Gastrointestinal:  Negative for abdominal pain, constipation and heartburn.  Musculoskeletal:  Negative for back pain, joint pain and myalgias.  Skin: Negative.   Neurological:  Negative for dizziness.  Psychiatric/Behavioral:  Negative for depression. The patient has insomnia. The patient is not nervous/anxious.    Physical Exam Constitutional:      General: He is not in acute distress.    Appearance: He is well-developed. He is not diaphoretic.  Neck:     Thyroid: No thyromegaly.  Cardiovascular:     Rate and Rhythm: Normal rate and regular rhythm.     Pulses: Normal pulses.     Heart sounds: Normal heart sounds.  Pulmonary:     Effort: Pulmonary effort is normal. No respiratory distress.     Breath sounds: Normal breath sounds.  Abdominal:     General: Bowel sounds are normal. There is no distension.      Palpations: Abdomen is soft.     Tenderness: There is no abdominal tenderness.  Genitourinary:    Comments: Foley scrotal edema; has had long term from a hernia  Musculoskeletal:        General: Normal range of motion.     Cervical back: Neck supple.     Right lower leg: No edema.     Left lower leg: No edema.     Comments:  03-07-22: left shoulder arthoplasty from fracture  sling in place  Lymphadenopathy:     Cervical: No cervical adenopathy.  Skin:    General: Skin is warm and dry.  Neurological:     Mental Status: He is alert and oriented to person, place, and time.  Psychiatric:        Mood and Affect: Mood normal.        Thought Content: Thought content normal.     ASSESSMENT/ PLAN:  TODAY  Idiopathic chronic gout unspecified site without tophi Primary insomnia  Will continue allopurinol 300 mg daily  Will begin melatonin 3 mg nightly    Ok Edwards NP [facility]  call [phone]

## 2022-04-02 DIAGNOSIS — G4700 Insomnia, unspecified: Secondary | ICD-10-CM | POA: Insufficient documentation

## 2022-04-05 DIAGNOSIS — Z9181 History of falling: Secondary | ICD-10-CM | POA: Diagnosis not present

## 2022-04-05 DIAGNOSIS — Z8616 Personal history of COVID-19: Secondary | ICD-10-CM | POA: Diagnosis not present

## 2022-04-05 DIAGNOSIS — N179 Acute kidney failure, unspecified: Secondary | ICD-10-CM | POA: Diagnosis not present

## 2022-04-05 DIAGNOSIS — S37009D Unspecified injury of unspecified kidney, subsequent encounter: Secondary | ICD-10-CM | POA: Diagnosis not present

## 2022-04-05 DIAGNOSIS — M6281 Muscle weakness (generalized): Secondary | ICD-10-CM | POA: Diagnosis not present

## 2022-04-05 DIAGNOSIS — R498 Other voice and resonance disorders: Secondary | ICD-10-CM | POA: Diagnosis not present

## 2022-04-05 DIAGNOSIS — Z4789 Encounter for other orthopedic aftercare: Secondary | ICD-10-CM | POA: Diagnosis not present

## 2022-04-05 DIAGNOSIS — R4782 Fluency disorder in conditions classified elsewhere: Secondary | ICD-10-CM | POA: Diagnosis not present

## 2022-04-05 DIAGNOSIS — R262 Difficulty in walking, not elsewhere classified: Secondary | ICD-10-CM | POA: Diagnosis not present

## 2022-04-06 DIAGNOSIS — R262 Difficulty in walking, not elsewhere classified: Secondary | ICD-10-CM | POA: Diagnosis not present

## 2022-04-06 DIAGNOSIS — N179 Acute kidney failure, unspecified: Secondary | ICD-10-CM | POA: Diagnosis not present

## 2022-04-06 DIAGNOSIS — R4782 Fluency disorder in conditions classified elsewhere: Secondary | ICD-10-CM | POA: Diagnosis not present

## 2022-04-06 DIAGNOSIS — S37009D Unspecified injury of unspecified kidney, subsequent encounter: Secondary | ICD-10-CM | POA: Diagnosis not present

## 2022-04-06 DIAGNOSIS — Z9181 History of falling: Secondary | ICD-10-CM | POA: Diagnosis not present

## 2022-04-06 DIAGNOSIS — R498 Other voice and resonance disorders: Secondary | ICD-10-CM | POA: Diagnosis not present

## 2022-04-06 DIAGNOSIS — Z4789 Encounter for other orthopedic aftercare: Secondary | ICD-10-CM | POA: Diagnosis not present

## 2022-04-06 DIAGNOSIS — Z8616 Personal history of COVID-19: Secondary | ICD-10-CM | POA: Diagnosis not present

## 2022-04-06 DIAGNOSIS — M6281 Muscle weakness (generalized): Secondary | ICD-10-CM | POA: Diagnosis not present

## 2022-04-07 ENCOUNTER — Ambulatory Visit: Payer: Medicare HMO | Admitting: Urology

## 2022-04-07 VITALS — BP 127/84 | HR 101

## 2022-04-07 DIAGNOSIS — R498 Other voice and resonance disorders: Secondary | ICD-10-CM | POA: Diagnosis not present

## 2022-04-07 DIAGNOSIS — Z9181 History of falling: Secondary | ICD-10-CM | POA: Diagnosis not present

## 2022-04-07 DIAGNOSIS — R4782 Fluency disorder in conditions classified elsewhere: Secondary | ICD-10-CM | POA: Diagnosis not present

## 2022-04-07 DIAGNOSIS — N3289 Other specified disorders of bladder: Secondary | ICD-10-CM | POA: Diagnosis not present

## 2022-04-07 DIAGNOSIS — M6281 Muscle weakness (generalized): Secondary | ICD-10-CM | POA: Diagnosis not present

## 2022-04-07 DIAGNOSIS — Z8616 Personal history of COVID-19: Secondary | ICD-10-CM | POA: Diagnosis not present

## 2022-04-07 DIAGNOSIS — Z4789 Encounter for other orthopedic aftercare: Secondary | ICD-10-CM | POA: Diagnosis not present

## 2022-04-07 DIAGNOSIS — N179 Acute kidney failure, unspecified: Secondary | ICD-10-CM | POA: Diagnosis not present

## 2022-04-07 DIAGNOSIS — S37009D Unspecified injury of unspecified kidney, subsequent encounter: Secondary | ICD-10-CM | POA: Diagnosis not present

## 2022-04-07 DIAGNOSIS — R262 Difficulty in walking, not elsewhere classified: Secondary | ICD-10-CM | POA: Diagnosis not present

## 2022-04-07 MED ORDER — TAMSULOSIN HCL 0.4 MG PO CAPS: 0.4000 mg | ORAL_CAPSULE | Freq: Two times a day (BID) | ORAL | 11 refills | Status: DC

## 2022-04-07 MED ORDER — FINASTERIDE 5 MG PO TABS: 5.0000 mg | ORAL_TABLET | Freq: Every day | ORAL | 3 refills | Status: DC

## 2022-04-07 MED ORDER — CIPROFLOXACIN HCL 500 MG PO TABS
500.0000 mg | ORAL_TABLET | Freq: Once | ORAL | Status: AC
  Administered 2022-04-07: 500 mg via ORAL

## 2022-04-07 NOTE — Progress Notes (Signed)
Cath Change/ Replacement  Patient is present today for a catheter change due to urinary retention.  67m of water was removed from the balloon, a 16 coude FR foley cath was removed without difficulty.  Patient was cleaned and prepped in a sterile fashion with betadine and 2% lidocaine jelly was instilled into the urethra. A 16 coude FR foley cath was replaced into the bladder, no complications were noted. Urine return was noted 1077mand urine was yellow in color. The balloon was filled with 1065mf sterile water. A leg bag was attached for drainage.  A night bag was also given to the patient and patient was given instruction on how to change from one bag to another. Patient was given proper instruction on catheter care.    Performed by: Keynan Heffern LPN  Follow up: Per MD note

## 2022-04-07 NOTE — H&P (View-Only) (Signed)
   04/07/22  CC: bladder mass   HPI: Mr Geoffrey Patterson is a 71yo here for cystoscopy for a bladder mass Blood pressure 127/84, pulse (!) 101. NED. A&Ox3.   No respiratory distress   Abd soft, NT, ND Normal phallus with bilateral descended testicles  Cystoscopy Procedure Note  Patient identification was confirmed, informed consent was obtained, and patient was prepped using Betadine solution.  Lidocaine jelly was administered per urethral meatus.     Pre-Procedure: - Inspection reveals a normal caliber ureteral meatus.  Procedure: The flexible cystoscope was introduced without difficulty - No urethral strictures/lesions are present. - Enlarged prostate  - Normal bladder neck - Bilateral ureteral orifices identified - Bladder mucosa  reveals 5cm bladder mass - No bladder stones - No trabeculation     Post-Procedure: - Patient tolerated the procedure well  Assessment/ Plan: Schedule for bladder tumor resection Increased flomax to BID and add finasteride '5mg'$  daily  No follow-ups on file.  [name], MD

## 2022-04-07 NOTE — Progress Notes (Signed)
   04/07/22  CC: bladder mass   HPI: Mr Skowronek is a 71yo here for cystoscopy for a bladder mass Blood pressure 127/84, pulse (!) 101. NED. A&Ox3.   No respiratory distress   Abd soft, NT, ND Normal phallus with bilateral descended testicles  Cystoscopy Procedure Note  Patient identification was confirmed, informed consent was obtained, and patient was prepped using Betadine solution.  Lidocaine jelly was administered per urethral meatus.     Pre-Procedure: - Inspection reveals a normal caliber ureteral meatus.  Procedure: The flexible cystoscope was introduced without difficulty - No urethral strictures/lesions are present. - Enlarged prostate  - Normal bladder neck - Bilateral ureteral orifices identified - Bladder mucosa  reveals 5cm bladder mass - No bladder stones - No trabeculation     Post-Procedure: - Patient tolerated the procedure well  Assessment/ Plan: Schedule for bladder tumor resection Increased flomax to BID and add finasteride '5mg'$  daily  No follow-ups on file.  [name], MD

## 2022-04-08 DIAGNOSIS — N179 Acute kidney failure, unspecified: Secondary | ICD-10-CM | POA: Diagnosis not present

## 2022-04-08 DIAGNOSIS — R498 Other voice and resonance disorders: Secondary | ICD-10-CM | POA: Diagnosis not present

## 2022-04-08 DIAGNOSIS — Z4789 Encounter for other orthopedic aftercare: Secondary | ICD-10-CM | POA: Diagnosis not present

## 2022-04-08 DIAGNOSIS — Z9181 History of falling: Secondary | ICD-10-CM | POA: Diagnosis not present

## 2022-04-08 DIAGNOSIS — Z8616 Personal history of COVID-19: Secondary | ICD-10-CM | POA: Diagnosis not present

## 2022-04-08 DIAGNOSIS — S37009D Unspecified injury of unspecified kidney, subsequent encounter: Secondary | ICD-10-CM | POA: Diagnosis not present

## 2022-04-08 DIAGNOSIS — M6281 Muscle weakness (generalized): Secondary | ICD-10-CM | POA: Diagnosis not present

## 2022-04-08 DIAGNOSIS — R262 Difficulty in walking, not elsewhere classified: Secondary | ICD-10-CM | POA: Diagnosis not present

## 2022-04-08 DIAGNOSIS — R4782 Fluency disorder in conditions classified elsewhere: Secondary | ICD-10-CM | POA: Diagnosis not present

## 2022-04-09 ENCOUNTER — Encounter: Payer: Self-pay | Admitting: Adult Health

## 2022-04-09 ENCOUNTER — Other Ambulatory Visit: Payer: Self-pay | Admitting: Adult Health

## 2022-04-09 ENCOUNTER — Non-Acute Institutional Stay (SKILLED_NURSING_FACILITY): Payer: Medicare HMO | Admitting: Adult Health

## 2022-04-09 DIAGNOSIS — N179 Acute kidney failure, unspecified: Secondary | ICD-10-CM | POA: Diagnosis not present

## 2022-04-09 DIAGNOSIS — R498 Other voice and resonance disorders: Secondary | ICD-10-CM | POA: Diagnosis not present

## 2022-04-09 DIAGNOSIS — I4821 Permanent atrial fibrillation: Secondary | ICD-10-CM | POA: Diagnosis not present

## 2022-04-09 DIAGNOSIS — R262 Difficulty in walking, not elsewhere classified: Secondary | ICD-10-CM | POA: Diagnosis not present

## 2022-04-09 DIAGNOSIS — S37009D Unspecified injury of unspecified kidney, subsequent encounter: Secondary | ICD-10-CM | POA: Diagnosis not present

## 2022-04-09 DIAGNOSIS — Z8616 Personal history of COVID-19: Secondary | ICD-10-CM | POA: Diagnosis not present

## 2022-04-09 DIAGNOSIS — E43 Unspecified severe protein-calorie malnutrition: Secondary | ICD-10-CM | POA: Diagnosis not present

## 2022-04-09 DIAGNOSIS — R4782 Fluency disorder in conditions classified elsewhere: Secondary | ICD-10-CM | POA: Diagnosis not present

## 2022-04-09 DIAGNOSIS — Z9181 History of falling: Secondary | ICD-10-CM | POA: Diagnosis not present

## 2022-04-09 DIAGNOSIS — Z4789 Encounter for other orthopedic aftercare: Secondary | ICD-10-CM | POA: Diagnosis not present

## 2022-04-09 DIAGNOSIS — M6281 Muscle weakness (generalized): Secondary | ICD-10-CM | POA: Diagnosis not present

## 2022-04-09 MED ORDER — ROSUVASTATIN CALCIUM 5 MG PO TABS: 5.0000 mg | ORAL_TABLET | Freq: Every day | ORAL | 0 refills | Status: AC

## 2022-04-09 MED ORDER — VALACYCLOVIR HCL 1 G PO TABS: 2000.0000 mg | ORAL_TABLET | Freq: Two times a day (BID) | ORAL | 0 refills | Status: AC | PRN

## 2022-04-09 MED ORDER — DILTIAZEM HCL ER COATED BEADS 120 MG PO CP24: 120.0000 mg | ORAL_CAPSULE | Freq: Every day | ORAL | 0 refills | Status: DC

## 2022-04-09 MED ORDER — TAMSULOSIN HCL 0.4 MG PO CAPS: 0.4000 mg | ORAL_CAPSULE | Freq: Two times a day (BID) | ORAL | 0 refills | Status: DC

## 2022-04-09 MED ORDER — FINASTERIDE 5 MG PO TABS: 5.0000 mg | ORAL_TABLET | Freq: Every day | ORAL | 0 refills | Status: DC

## 2022-04-09 MED ORDER — ALLOPURINOL 300 MG PO TABS: 300.0000 mg | ORAL_TABLET | Freq: Every day | ORAL | 0 refills | Status: AC

## 2022-04-09 MED ORDER — FEXOFENADINE HCL 180 MG PO TABS: 180.0000 mg | ORAL_TABLET | Freq: Every day | ORAL | 0 refills | Status: AC

## 2022-04-09 NOTE — Progress Notes (Signed)
Location:  [facility] Room Number: 409 Place of Service:  SNF [phone])  Provider: Ok Edwards np   PCP: Celene Squibb, MD Patient Care Team: Celene Squibb, MD as PCP - General (Internal Medicine)  Extended Emergency Contact Information Primary Emergency Contact: [place] Address: [address]          [place], [place] [address] Johnnette Litter of [place] Phone: [phone] Mobile Phone: [phone] Relation: Sister Secondary Emergency Contact: [place] Mobile Phone: [phone] Relation: Grandson  Code Status: full  Goals of care:  Advanced Directive information    03/23/2022   10:12 AM  Advanced Directives  Does Patient Have a Medical Advance Directive? Yes  Type of Paramedic of [place];Living will  Does patient want to make changes to medical advance directive? No - Patient declined  Copy of [place] in Chart? Yes - validated most recent copy scanned in chart (See row information)     No Known Allergies  Chief Complaint  Patient presents with   Discharge Note    HPI:  71 y.o. male  being discharged to home with pt/ot/rn. He will need a bedside commode. He will need his prescriptions written and to follow up with her medical provider. He had been hospitalized for acute metabolic acidosis due to renal failure and hyperkalemia.  He was admitted to this facility for pt/ot. Stand pivots independent; bed mobility independent; car transfer with supervision; stairs (12) moderate; upper and lower body moderate; toilet supervision. He is now ready to complete therapy on a home health basis.     Past Medical History:  Diagnosis Date   Dysrhythmia    a-fib history   Gout    Hypertension     Past Surgical History:  Procedure Laterality Date   COLONOSCOPY     COLONOSCOPY N/A 12/30/2016   Procedure: COLONOSCOPY;  Surgeon: Rogene Houston, MD;  [facility];  Service: Endoscopy;   Laterality: N/A;  1030   POLYPECTOMY  12/30/2016   Procedure: POLYPECTOMY;  Surgeon: Rogene Houston, MD;  Location: [facility];  Service: Endoscopy;;  colon    REVERSE SHOULDER ARTHROPLASTY Left 03/07/2022   Procedure: REVERSE SHOULDER ARTHROPLASTY;  Surgeon: Marchia Bond, MD;  Location: [facility];  Service: Orthopedics;  Laterality: Left;   TONSILLECTOMY        reports that he has quit smoking. His smoking use included cigarettes. He has never used smokeless tobacco. He reports that he does not drink alcohol and does not use drugs. Social History   Socioeconomic History   Marital status: Legally Separated    Spouse name: Not on file   Number of children: Not on file   Years of education: Not on file   Highest education level: Not on file  Occupational History   Not on file  Tobacco Use   Smoking status: Former    Types: Cigarettes   Smokeless tobacco: Never  Vaping Use   Vaping Use: Never used  Substance and Sexual Activity   Alcohol use: No   Drug use: No   Sexual activity: Never  Other Topics Concern   Not on file  Social History Narrative   Not on file   Social Determinants of Health   Financial Resource Strain: Not on file  Food Insecurity: No Food Insecurity (03/16/2022)   Hunger Vital Sign    Worried About Running Out of Food in the Last Year: Never true    Ran  Out of Food in the Last Year: Never true  Transportation Needs: No Transportation Needs (03/16/2022)   PRAPARE - Hydrologist (Medical): No    Lack of Transportation (Non-Medical): No  Physical Activity: Not on file  Stress: Not on file  Social Connections: Not on file  Intimate Partner Violence: Not At Risk (03/16/2022)   Humiliation, Afraid, Rape, and Kick questionnaire    Fear of Current or Ex-Partner: No    Emotionally Abused: No    Physically Abused: No    Sexually Abused: No   Functional Status Survey:    No Known Allergies  Pertinent  Health Maintenance Due   Topic Date Due   INFLUENZA VACCINE  01/12/2022   COLONOSCOPY (Pts 45-31yr Insurance coverage will need to be confirmed)  12/31/2026    Medications: Outpatient Encounter Medications as of 04/09/2022  Medication Sig   allopurinol (ZYLOPRIM) 300 MG tablet Take 300 mg by mouth daily.   Amino Acids-Protein Hydrolys (FEEDING SUPPLEMENT, PRO-STAT SUGAR FREE 64,) LIQD Take 30 mLs by mouth 3 (three) times daily with meals.   diltiazem (CARDIZEM CD) 120 MG 24 hr capsule Take 120 mg by mouth daily.   fexofenadine (ALLEGRA) 180 MG tablet Take 180 mg by mouth daily.   finasteride (PROSCAR) 5 MG tablet Take 1 tablet (5 mg total) by mouth daily.   rosuvastatin (CRESTOR) 5 MG tablet Take 5 mg by mouth daily.   sennosides-docusate sodium (SENOKOT-S) 8.6-50 MG tablet Take 2 tablets by mouth daily.   tamsulosin (FLOMAX) 0.4 MG CAPS capsule Take 1 capsule (0.4 mg total) by mouth in the morning and at bedtime.   valACYclovir (VALTREX) 1000 MG tablet Take 2,000 mg by mouth 2 (two) times daily as needed (fever blisters).   No facility-administered encounter medications on file as of 04/09/2022.     Vitals:   04/09/22 1107  BP: 117/62  Pulse: 67  Resp: 18  Temp: (!) 97.4 F (36.3 C)  SpO2: 95%  Weight: 235 lb 3.2 oz (106.7 kg)  Height: _0  (1.854 m)   Body mass index is 31.03 kg/m.  PREVIOUS   03-14-22: chest x-ray: Patchy retrocardiac airspace opacity in the left lower lobe with probable small left pleural effusion, which could reflect atelectasis versus pneumonia in the appropriate clinical context.  03-15-22: renal ultrasound: Kidneys are unremarkable. However, there appears to be a solid mass in the urinary bladder measuring 6.2 x 5.5 x 2.9 cm. Further evaluation with CT scan or cystoscopy is recommended.   NO NEW EXAMS   LABS REVIEWED PREVIOUS    03-14-22: wbc 14.1; hgb 12.2; ht 34.2; mcv 93.2 plt 165; glucose 116; bun 160; creat 12.49; k+ 5.9; na++ 131; ca 8.0; gfr 4; protein 5.9;  albumin 2.7; blood culture: no growth 03-16-22: wbc 8.4; hgb 10.6; hct 31.7; mcv 97.5 plt 164; glucose 108; bun 21; creat 0.91; k+ 4.3; na++ 140; ca 8.2 gfr >60 03-22-22: wbc 9.3; hgb 11.0; hct 32.4; mcv 95.0 plt 184; glucose 103; bun 17; creat 0.88; k+ 3.9; na++ 14; ca 8.6; gfr >60; protein 5.2; albumin 2.6 alt 55; alk phos 127   NO NEW LABS   Review of Systems  Constitutional:  Negative for malaise/fatigue.  Respiratory:  Negative for cough and shortness of breath.   Cardiovascular:  Negative for chest pain, palpitations and leg swelling.  Gastrointestinal:  Negative for abdominal pain, constipation and heartburn.  Musculoskeletal:  Negative for back pain, joint pain and myalgias.  Skin: Negative.  Neurological:  Negative for dizziness.  Psychiatric/Behavioral:  The patient is not nervous/anxious.    Physical Exam Constitutional:      General: He is not in acute distress.    Appearance: He is well-developed. He is not diaphoretic.  Neck:     Thyroid: No thyromegaly.  Cardiovascular:     Rate and Rhythm: Normal rate and regular rhythm.     Pulses: Normal pulses.     Heart sounds: Normal heart sounds.  Pulmonary:     Effort: Pulmonary effort is normal. No respiratory distress.     Breath sounds: Normal breath sounds.  Abdominal:     General: Bowel sounds are normal. There is no distension.     Palpations: Abdomen is soft.     Tenderness: There is no abdominal tenderness.  Genitourinary:    Comments:  Foley scrotal edema; has had long term from a hernia  Musculoskeletal:     Cervical back: Neck supple.     Right lower leg: No edema.     Left lower leg: No edema.     Comments:  03-07-22: left shoulder arthoplasty from fracture  sling in place    Lymphadenopathy:     Cervical: No cervical adenopathy.  Skin:    General: Skin is warm and dry.  Neurological:     Mental Status: He is alert and oriented to person, place, and time.  Psychiatric:        Mood and Affect: Mood  normal.       Assessment/Plan:    Patient is being discharged with the following home health services:  pt/ot/rn: to evaluate and treat as indicated for gait balance strength adl training medication management   Patient is being discharged with the following durable medical equipment:  bed side commode   Patient has been advised to f/u with their PCP in 1-2 weeks to for a transitions of care visit.  Social services at their facility was responsible for arranging this appointment.  Pt was provided with adequate prescriptions of noncontrolled medications to reach the scheduled appointment .  For controlled substances, a limited supply was provided as appropriate for the individual patient.  If the pt normally receives these medications from a pain clinic or has a contract with another physician, these medications should be received from that clinic or physician only).    A 30 day supply of her current medications have been sent to [facility]   Time spent with patient 35 minutes: medications; home health; dme    Ok Edwards NP [facility]   call [phone]

## 2022-04-11 DIAGNOSIS — M6281 Muscle weakness (generalized): Secondary | ICD-10-CM | POA: Diagnosis not present

## 2022-04-11 DIAGNOSIS — R0609 Other forms of dyspnea: Secondary | ICD-10-CM | POA: Diagnosis not present

## 2022-04-11 DIAGNOSIS — R262 Difficulty in walking, not elsewhere classified: Secondary | ICD-10-CM | POA: Diagnosis not present

## 2022-04-13 ENCOUNTER — Encounter (HOSPITAL_COMMUNITY): Payer: Self-pay

## 2022-04-13 ENCOUNTER — Encounter (HOSPITAL_COMMUNITY)
Admission: RE | Admit: 2022-04-13 | Discharge: 2022-04-13 | Disposition: A | Discharge status: Home / Self Care | Payer: Medicare HMO | Source: Ambulatory Visit | Attending: Urology | Admitting: Urology

## 2022-04-13 ENCOUNTER — Encounter: Payer: Self-pay | Admitting: Urology

## 2022-04-13 DIAGNOSIS — Z01818 Encounter for other preprocedural examination: Secondary | ICD-10-CM | POA: Insufficient documentation

## 2022-04-13 NOTE — Patient Instructions (Signed)
Transurethral Resection of Bladder Tumor  Transurethral resection of a bladder tumor is the removal (resection) of cancerous tissue (tumor) from the inside wall of the bladder. The bladder is the organ that holds urine. The tumor is removed through the tube that carries urine out of the body (urethra). In a transurethral resection, a thin telescope with a light, a tiny camera, and an electric cutting edge (resectoscope) is passed through the urethra. In men, the opening of the urethra is at the end of the penis. In women, it is just above the opening of the vagina. Tell a health care provider about: Any allergies you have. All medicines you are taking, including vitamins, herbs, eye drops, creams, and over-the-counter medicines. Any problems you or family members have had with anesthetic medicines. Any bleeding problems you have. Any surgeries you have had. Any medical conditions you have, including recent urinary tract infections. Whether you are pregnant or may be pregnant. What are the risks? Generally, this is a safe procedure. However, problems may occur, including: Infection. Bleeding. Allergic reactions to medicines. Damage to nearby structures or organs. Difficulty urinating from blockage of the urethra or not being able to urinate (urinary retention). Deep vein thrombosis. This is a blood clot that can develop in your leg. Recurring cancer. What happens before the procedure? When to stop eating and drinking Follow instructions from your health care provider about what you may eat and drink before your procedure. These may include: 8 hours before your procedure Stop eating most foods. Do not eat meat, fried foods, or fatty foods. Eat only light foods, such as toast or crackers. All liquids are okay except energy drinks and alcohol. 6 hours before your procedure Stop eating. Drink only clear liquids, such as water, clear fruit juice, black coffee, plain tea, and sports  drinks. Do not drink energy drinks or alcohol. 2 hours before your procedure Stop drinking all liquids. You may be allowed to take medicines with small sips of water. Medicines Ask your health care provider about: Changing or stopping your regular medicines. This is especially important if you are taking diabetes medicines or blood thinners. Taking medicines such as aspirin and ibuprofen. These medicines can thin your blood. Do not take these medicines unless your health care provider tells you to take them. Taking over-the-counter medicines, vitamins, herbs, and supplements. General instructions If you will be going home right after the procedure, plan to have a responsible adult: Take you home from the hospital or clinic. You will not be allowed to drive. Care for you for the time you are told. Ask your health care provider what steps will be taken to help prevent infection. These steps may include: Washing skin with a germ-killing soap. Taking antibiotic medicine. Do not use any products that contain nicotine or tobacco for at least 4 weeks before the procedure. These products include cigarettes, chewing tobacco, and vaping devices, such as e-cigarettes. If you need help quitting, ask your health care provider. What happens during the procedure? An IV will be inserted into one of your veins. You will be given one or more of the following: A medicine to help you relax (sedative). A medicine that is injected into your spine to numb the area below and slightly above the injection site (spinal anesthetic). A medicine that is injected into an area of your body to numb everything below the injection site (regional anesthetic). A medicine to make you fall asleep (general anesthetic). Your legs will be placed in foot rests (  stirrups) to open your legs and bend your knees. The resectoscope will be passed through your urethra and into your bladder. The part of your bladder with the tumor will be  resected by the cutting edge of the resectoscope. Fluid will be passed to rinse out the cut tissues (irrigation). The resectoscope will then be taken out. A small, thin tube (catheter) will be passed through your urethra and into your bladder. The catheter will drain urine into a bag outside of your body. The procedure may vary among health care providers and hospitals. What happens after the procedure? Your blood pressure, heart rate, breathing rate, and blood oxygen level will be monitored until you leave the hospital or clinic. You may continue to receive fluids and medicines through an IV. You will be given pain medicine to relieve pain. You will have a catheter to drain your urine. The amount of urine will be measured. If you have blood in your urine, your bladder may be rinsed out by passing fluid through your catheter. You will be encouraged to walk as soon as you can. You may have to wear compression stockings. These stockings help to prevent blood clots and reduce swelling in your legs. If you were given a sedative during the procedure, it can affect you for several hours. Do not drive or operate machinery until your health care provider says that it is safe. Summary Transurethral resection of a bladder tumor is the removal (resection) of a cancerous growth (tumor) on the inside wall of the bladder. To do this procedure, your health care provider uses a thin telescope with a light, a tiny camera, and an electric cutting edge (resectoscope) that is guided to your bladder through your urethra. The part of your bladder that is affected by the tumor will be resected by the cutting edge of the resectoscope. A catheter will be passed through your urethra and into your bladder. The catheter will drain urine into a bag outside of your body. If you will be going home right after the procedure, plan to have a responsible adult take you home from the hospital or clinic. You will not be allowed to  drive. This information is not intended to replace advice given to you by your health care provider. Make sure you discuss any questions you have with your health care provider. Document Revised: 06/05/2021 Document Reviewed: 06/05/2021 [employer] Patient Education  2023 [employer]  

## 2022-04-13 NOTE — Patient Instructions (Signed)
Geoffrey Patterson  04/13/2022     '@[email or web address]'$ @   Your procedure is scheduled on 04/15/2022.  Report to [facility] at 7:30 A.M.  Call this number if you have problems the morning of surgery:  [phone]  If you experience any cold or flu symptoms such as cough, fever, chills, shortness of breath, etc. between now and your scheduled surgery, please notify us at the above number.   Remember:  Do not eat or drink after midnight.      Take these medicines the morning of surgery with A SIP OF WATER : Allopurinol Diltiazem Allegra Flomax and Droscare    Do not wear jewelry, make-up or nail polish.  Do not wear lotions, powders, or perfumes, or deodorant.  Do not shave 48 hours prior to surgery.  Men may shave face and neck.  Do not bring valuables to the hospital.  [facility] is not responsible for any belongings or valuables.  Contacts, dentures or bridgework may not be worn into surgery.  Leave your suitcase in the car.  After surgery it may be brought to your room.  For patients admitted to the hospital, discharge time will be determined by your treatment team.  Patients discharged the day of surgery will not be allowed to drive home.   Name and phone number of your driver:   family Special instructions:  N/a  Please read over the following fact sheets that you were given. Care and Recovery After Surgery Transurethral Resection of Bladder Tumor  Transurethral resection of a bladder tumor is the removal (resection) of cancerous tissue (tumor) from the inside wall of the bladder. The bladder is the organ that holds urine. The tumor is removed through the tube that carries urine out of the body (urethra). In a transurethral resection, a thin telescope with a light, a tiny camera, and an electric cutting edge (resectoscope) is passed through the urethra. In men, the opening of the urethra is at the end of the penis. In women, it is just above the opening of the  vagina. Tell a health care provider about: Any allergies you have. All medicines you are taking, including vitamins, herbs, eye drops, creams, and over-the-counter medicines. Any problems you or family members have had with anesthetic medicines. Any bleeding problems you have. Any surgeries you have had. Any medical conditions you have, including recent urinary tract infections. Whether you are pregnant or may be pregnant. What are the risks? Generally, this is a safe procedure. However, problems may occur, including: Infection. Bleeding. Allergic reactions to medicines. Damage to nearby structures or organs. Difficulty urinating from blockage of the urethra or not being able to urinate (urinary retention). Deep vein thrombosis. This is a blood clot that can develop in your leg. Recurring cancer. What happens before the procedure? When to stop eating and drinking Follow instructions from your health care provider about what you may eat and drink before your procedure. These may include: 8 hours before your procedure Stop eating most foods. Do not eat meat, fried foods, or fatty foods. Eat only light foods, such as toast or crackers. All liquids are okay except energy drinks and alcohol. 6 hours before your procedure Stop eating. Drink only clear liquids, such as water, clear fruit juice, black coffee, plain tea, and sports drinks. Do not drink energy drinks or alcohol. 2 hours before your procedure Stop drinking all liquids. You may be allowed to take medicines with small sips of water. Medicines Ask your health care  provider about: Changing or stopping your regular medicines. This is especially important if you are taking diabetes medicines or blood thinners. Taking medicines such as aspirin and ibuprofen. These medicines can thin your blood. Do not take these medicines unless your health care provider tells you to take them. Taking over-the-counter medicines, vitamins, herbs,  and supplements. General instructions If you will be going home right after the procedure, plan to have a responsible adult: Take you home from the hospital or clinic. You will not be allowed to drive. Care for you for the time you are told. Ask your health care provider what steps will be taken to help prevent infection. These steps may include: Washing skin with a germ-killing soap. Taking antibiotic medicine. Do not use any products that contain nicotine or tobacco for at least 4 weeks before the procedure. These products include cigarettes, chewing tobacco, and vaping devices, such as e-cigarettes. If you need help quitting, ask your health care provider. What happens during the procedure? An IV will be inserted into one of your veins. You will be given one or more of the following: A medicine to help you relax (sedative). A medicine that is injected into your spine to numb the area below and slightly above the injection site (spinal anesthetic). A medicine that is injected into an area of your body to numb everything below the injection site (regional anesthetic). A medicine to make you fall asleep (general anesthetic). Your legs will be placed in foot rests (stirrups) to open your legs and bend your knees. The resectoscope will be passed through your urethra and into your bladder. The part of your bladder with the tumor will be resected by the cutting edge of the resectoscope. Fluid will be passed to rinse out the cut tissues (irrigation). The resectoscope will then be taken out. A small, thin tube (catheter) will be passed through your urethra and into your bladder. The catheter will drain urine into a bag outside of your body. The procedure may vary among health care providers and hospitals. What happens after the procedure? Your blood pressure, heart rate, breathing rate, and blood oxygen level will be monitored until you leave the hospital or clinic. You may continue to receive  fluids and medicines through an IV. You will be given pain medicine to relieve pain. You will have a catheter to drain your urine. The amount of urine will be measured. If you have blood in your urine, your bladder may be rinsed out by passing fluid through your catheter. You will be encouraged to walk as soon as you can. You may have to wear compression stockings. These stockings help to prevent blood clots and reduce swelling in your legs. If you were given a sedative during the procedure, it can affect you for several hours. Do not drive or operate machinery until your health care provider says that it is safe. Summary Transurethral resection of a bladder tumor is the removal (resection) of a cancerous growth (tumor) on the inside wall of the bladder. To do this procedure, your health care provider uses a thin telescope with a light, a tiny camera, and an electric cutting edge (resectoscope) that is guided to your bladder through your urethra. The part of your bladder that is affected by the tumor will be resected by the cutting edge of the resectoscope. A catheter will be passed through your urethra and into your bladder. The catheter will drain urine into a bag outside of your body. If you will be  going home right after the procedure, plan to have a responsible adult take you home from the hospital or clinic. You will not be allowed to drive. This information is not intended to replace advice given to you by your health care provider. Make sure you discuss any questions you have with your health care provider. Document Revised: 06/05/2021 Document Reviewed: 06/05/2021 Elsevier Patient Education  2023 Elsevier  How to Use Chlorhexidine Before Surgery Chlorhexidine gluconate (CHG) is a germ-killing (antiseptic) solution that is used to clean the skin. It can get rid of the bacteria that normally live on the skin and can keep them away for about 24 hours. To clean your skin with CHG, you may be  given: A CHG solution to use in the shower or as part of a sponge bath. A prepackaged cloth that contains CHG. Cleaning your skin with CHG may help lower the risk for infection: While you are staying in the intensive care unit of the hospital. If you have a vascular access, such as a central line, to provide short-term or long-term access to your veins. If you have a catheter to drain urine from your bladder. If you are on a ventilator. A ventilator is a machine that helps you breathe by moving air in and out of your lungs. After surgery. What are the risks? Risks of using CHG include: A skin reaction. Hearing loss, if CHG gets in your ears and you have a perforated eardrum. Eye injury, if CHG gets in your eyes and is not rinsed out. The CHG product catching fire. Make sure that you avoid smoking and flames after applying CHG to your skin. Do not use CHG: If you have a chlorhexidine allergy or have previously reacted to chlorhexidine. On babies younger than 89 months of age. How to use CHG solution Use CHG only as told by your health care provider, and follow the instructions on the label. Use the full amount of CHG as directed. Usually, this is one bottle. During a shower Follow these steps when using CHG solution during a shower (unless your health care provider gives you different instructions): Start the shower. Use your normal soap and shampoo to wash your face and hair. Turn off the shower or move out of the shower stream. Pour the CHG onto a clean washcloth. Do not use any type of brush or rough-edged sponge. Starting at your neck, lather your body down to your toes. Make sure you follow these instructions: If you will be having surgery, pay special attention to the part of your body where you will be having surgery. Scrub this area for at least 1 minute. Do not use CHG on your head or face. If the solution gets into your ears or eyes, rinse them well with water. Avoid your  genital area. Avoid any areas of skin that have broken skin, cuts, or scrapes. Scrub your back and under your arms. Make sure to wash skin folds. Let the lather sit on your skin for 1-2 minutes or as long as told by your health care provider. Thoroughly rinse your entire body in the shower. Make sure that all body creases and crevices are rinsed well. Dry off with a clean towel. Do not put any substances on your body afterward--such as powder, lotion, or perfume--unless you are told to do so by your health care provider. Only use lotions that are recommended by the manufacturer. Put on clean clothes or pajamas. If it is the night before your surgery, sleep  in clean sheets.  During a sponge bath Follow these steps when using CHG solution during a sponge bath (unless your health care provider gives you different instructions): Use your normal soap and shampoo to wash your face and hair. Pour the CHG onto a clean washcloth. Starting at your neck, lather your body down to your toes. Make sure you follow these instructions: If you will be having surgery, pay special attention to the part of your body where you will be having surgery. Scrub this area for at least 1 minute. Do not use CHG on your head or face. If the solution gets into your ears or eyes, rinse them well with water. Avoid your genital area. Avoid any areas of skin that have broken skin, cuts, or scrapes. Scrub your back and under your arms. Make sure to wash skin folds. Let the lather sit on your skin for 1-2 minutes or as long as told by your health care provider. Using a different clean, wet washcloth, thoroughly rinse your entire body. Make sure that all body creases and crevices are rinsed well. Dry off with a clean towel. Do not put any substances on your body afterward--such as powder, lotion, or perfume--unless you are told to do so by your health care provider. Only use lotions that are recommended by the manufacturer. Put on  clean clothes or pajamas. If it is the night before your surgery, sleep in clean sheets. How to use CHG prepackaged cloths Only use CHG cloths as told by your health care provider, and follow the instructions on the label. Use the CHG cloth on clean, dry skin. Do not use the CHG cloth on your head or face unless your health care provider tells you to. When washing with the CHG cloth: Avoid your genital area. Avoid any areas of skin that have broken skin, cuts, or scrapes. Before surgery Follow these steps when using a CHG cloth to clean before surgery (unless your health care provider gives you different instructions): Using the CHG cloth, vigorously scrub the part of your body where you will be having surgery. Scrub using a back-and-forth motion for 3 minutes. The area on your body should be completely wet with CHG when you are done scrubbing. Do not rinse. Discard the cloth and let the area air-dry. Do not put any substances on the area afterward, such as powder, lotion, or perfume. Put on clean clothes or pajamas. If it is the night before your surgery, sleep in clean sheets.  For general bathing Follow these steps when using CHG cloths for general bathing (unless your health care provider gives you different instructions). Use a separate CHG cloth for each area of your body. Make sure you wash between any folds of skin and between your fingers and toes. Wash your body in the following order, switching to a new cloth after each step: The front of your neck, shoulders, and chest. Both of your arms, under your arms, and your hands. Your stomach and groin area, avoiding the genitals. Your right leg and foot. Your left leg and foot. The back of your neck, your back, and your buttocks. Do not rinse. Discard the cloth and let the area air-dry. Do not put any substances on your body afterward--such as powder, lotion, or perfume--unless you are told to do so by your health care provider. Only use  lotions that are recommended by the manufacturer. Put on clean clothes or pajamas. Contact a health care provider if: Your skin gets irritated after  scrubbing. You have questions about using your solution or cloth. You swallow any chlorhexidine. Call your local poison control center (1-254-654-7792 in the [place] Get help right away if: Your eyes itch badly, or they become very red or swollen. Your skin itches badly and is red or swollen. Your hearing changes. You have trouble seeing. You have swelling or tingling in your mouth or throat. You have trouble breathing. These symptoms may represent a serious problem that is an emergency. Do not wait to see if the symptoms will go away. Get medical help right away. Call your local emergency services (911 in the [place] Do not drive yourself to the hospital. Summary Chlorhexidine gluconate (CHG) is a germ-killing (antiseptic) solution that is used to clean the skin. Cleaning your skin with CHG may help to lower your risk for infection. You may be given CHG to use for bathing. It may be in a bottle or in a prepackaged cloth to use on your skin. Carefully follow your health care provider's instructions and the instructions on the product label. Do not use CHG if you have a chlorhexidine allergy. Contact your health care provider if your skin gets irritated after scrubbing. This information is not intended to replace advice given to you by your health care provider. Make sure you discuss any questions you have with your health care provider. Document Revised: 09/28/2021 Document Reviewed: 08/11/2020 Elsevier Patient Education  [place] Anesthesia, Adult General anesthesia is the use of medicine to make you fall asleep (unconscious) for a medical procedure. General anesthesia must be used for certain procedures. It is often recommended for surgery or procedures that: Last a long time. Require you to be still or in an unusual  position. Are major and can cause blood loss. Affect your breathing. The medicines used for general anesthesia are called general anesthetics. During general anesthesia, these medicines are given along with medicines that: Prevent pain. Control your blood pressure. Relax your muscles. Prevent nausea and vomiting after the procedure. Tell a health care provider about: Any allergies you have. All medicines you are taking, including vitamins, herbs, eye drops, creams, and over-the-counter medicines. Your history of any: Medical conditions you have, including: High blood pressure. Bleeding problems. Diabetes. Heart or lung conditions, such as: Heart failure. Sleep apnea. Asthma. Chronic obstructive pulmonary disease (COPD). Current or recent illnesses, such as: Upper respiratory, chest, or ear infections. Cough or fever. Tobacco or drug use, including marijuana or alcohol use. Depression or anxiety. Surgeries and types of anesthetics you have had. Problems you or family members have had with anesthetic medicines. Whether you are pregnant or may be pregnant. Whether you have any chipped or loose teeth, dentures, caps, bridgework, or issues with your mouth, swallowing, or choking. What are the risks? Your health care provider will talk with you about risks. These may include: Allergic reaction to the medicines. Lung and heart problems. Inhaling food or liquid from the stomach into the lungs (aspiration). Nerve injury. Injury to the lips, mouth, teeth, or gums. Stroke. Waking up during your procedure and being unable to move. This is rare. These problems are more likely to develop if you are having a major surgery or if you have an advanced or serious medical condition. You can prevent some of these complications by answering all of your health care provider's questions thoroughly and by following all instructions before your procedure. General anesthesia can cause side effects,  including: Nausea or vomiting. A sore throat or hoarseness from the breathing  tube. Wheezing or coughing. Shaking chills or feeling cold. Body aches. Sleepiness. Confusion, agitation (delirium), or anxiety. What happens before the procedure? When to stop eating and drinking Follow instructions from your health care provider about what you may eat and drink before your procedure. If you do not follow your health care provider's instructions, your procedure may be delayed or canceled. Medicines Ask your health care provider about: Changing or stopping your regular medicines. These include any diabetes medicines or blood thinners you take. Taking medicines such as aspirin and ibuprofen. These medicines can thin your blood. Do not take them unless your health care provider tells you to. Taking over-the-counter medicines, vitamins, herbs, and supplements. General instructions Do not use any products that contain nicotine or tobacco for at least 4 weeks before the procedure. These products include cigarettes, chewing tobacco, and vaping devices, such as e-cigarettes. If you need help quitting, ask your health care provider. If you brush your teeth on the morning of the procedure, make sure to spit out all of the water and toothpaste. If told by your health care provider, bring your sleep apnea device with you to surgery (if applicable). If you will be going home right after the procedure, plan to have a responsible adult: Take you home from the hospital or clinic. You will not be allowed to drive. Care for you for the time you are told. What happens during the procedure?  An IV will be inserted into one of your veins. You will be given one or more of the following through a face mask or IV: A sedative. This helps you relax. Anesthesia. This will: Numb certain areas of your body. Make you fall asleep for surgery. After you are unconscious, a breathing tube may be inserted down your throat to  help you breathe. This will be removed before you wake up. An anesthesia provider, such as an anesthesiologist, will stay with you throughout your procedure. The anesthesia provider will: Keep you comfortable and safe by continuing to give you medicines and adjusting the amount of medicine that you get. Monitor your blood pressure, heart rate, and oxygen levels to make sure that the anesthetics do not cause any problems. The procedure may vary among health care providers and hospitals. What happens after the procedure? Your blood pressure, temperature, heart rate, breathing rate, and blood oxygen level will be monitored until you leave the hospital or clinic. You will wake up in a recovery area. You may wake up slowly. You may be given medicine to help you with pain, nausea, or any other side effects from the anesthesia. Summary General anesthesia is the use of medicine to make you fall asleep (unconscious) for a medical procedure. Follow your health care provider's instructions about when to stop eating, drinking, or taking certain medicines before your procedure. Plan to have a responsible adult take you home from the hospital or clinic. This information is not intended to replace advice given to you by your health care provider. Make sure you discuss any questions you have with your health care provider. Document Revised: 08/27/2021 Document Reviewed: 08/27/2021 Elsevier Patient Education  [place].

## 2022-04-14 ENCOUNTER — Ambulatory Visit: Payer: Medicare HMO | Admitting: Physician Assistant

## 2022-04-14 DIAGNOSIS — Z9181 History of falling: Secondary | ICD-10-CM | POA: Diagnosis not present

## 2022-04-14 DIAGNOSIS — I10 Essential (primary) hypertension: Secondary | ICD-10-CM | POA: Diagnosis not present

## 2022-04-14 DIAGNOSIS — Z96612 Presence of left artificial shoulder joint: Secondary | ICD-10-CM | POA: Diagnosis not present

## 2022-04-14 DIAGNOSIS — I4821 Permanent atrial fibrillation: Secondary | ICD-10-CM | POA: Diagnosis not present

## 2022-04-14 DIAGNOSIS — R338 Other retention of urine: Secondary | ICD-10-CM | POA: Diagnosis not present

## 2022-04-14 DIAGNOSIS — Z87891 Personal history of nicotine dependence: Secondary | ICD-10-CM | POA: Diagnosis not present

## 2022-04-14 DIAGNOSIS — E43 Unspecified severe protein-calorie malnutrition: Secondary | ICD-10-CM | POA: Diagnosis not present

## 2022-04-14 DIAGNOSIS — E785 Hyperlipidemia, unspecified: Secondary | ICD-10-CM | POA: Diagnosis not present

## 2022-04-14 DIAGNOSIS — M109 Gout, unspecified: Secondary | ICD-10-CM | POA: Diagnosis not present

## 2022-04-14 DIAGNOSIS — N179 Acute kidney failure, unspecified: Secondary | ICD-10-CM | POA: Diagnosis not present

## 2022-04-14 DIAGNOSIS — Z466 Encounter for fitting and adjustment of urinary device: Secondary | ICD-10-CM | POA: Diagnosis not present

## 2022-04-15 ENCOUNTER — Ambulatory Visit (HOSPITAL_BASED_OUTPATIENT_CLINIC_OR_DEPARTMENT_OTHER): Payer: Medicare HMO | Admitting: Anesthesiology

## 2022-04-15 ENCOUNTER — Encounter (HOSPITAL_COMMUNITY)
Admission: RE | Disposition: A | Discharge status: Home / Self Care | Payer: Self-pay | Source: Home / Self Care | Attending: Urology

## 2022-04-15 ENCOUNTER — Ambulatory Visit (HOSPITAL_COMMUNITY): Payer: Medicare HMO | Admitting: Anesthesiology

## 2022-04-15 ENCOUNTER — Encounter (HOSPITAL_COMMUNITY): Payer: Self-pay | Admitting: Urology

## 2022-04-15 ENCOUNTER — Ambulatory Visit (HOSPITAL_COMMUNITY)
Admission: RE | Admit: 2022-04-15 | Discharge: 2022-04-15 | Disposition: A | Discharge status: Home / Self Care | Payer: Medicare HMO | Attending: Urology | Admitting: Urology

## 2022-04-15 DIAGNOSIS — Z87891 Personal history of nicotine dependence: Secondary | ICD-10-CM | POA: Insufficient documentation

## 2022-04-15 DIAGNOSIS — I4891 Unspecified atrial fibrillation: Secondary | ICD-10-CM | POA: Insufficient documentation

## 2022-04-15 DIAGNOSIS — D494 Neoplasm of unspecified behavior of bladder: Secondary | ICD-10-CM

## 2022-04-15 DIAGNOSIS — N3090 Cystitis, unspecified without hematuria: Secondary | ICD-10-CM | POA: Diagnosis not present

## 2022-04-15 DIAGNOSIS — N3080 Other cystitis without hematuria: Secondary | ICD-10-CM | POA: Insufficient documentation

## 2022-04-15 DIAGNOSIS — I10 Essential (primary) hypertension: Secondary | ICD-10-CM | POA: Diagnosis not present

## 2022-04-15 DIAGNOSIS — D414 Neoplasm of uncertain behavior of bladder: Secondary | ICD-10-CM | POA: Diagnosis not present

## 2022-04-15 HISTORY — PX: TRANSURETHRAL RESECTION OF BLADDER TUMOR: SHX2575

## 2022-04-15 HISTORY — PX: CYSTOSCOPY: SHX5120

## 2022-04-15 SURGERY — CYSTOSCOPY
Anesthesia: General

## 2022-04-15 MED ORDER — SUGAMMADEX SODIUM 200 MG/2ML IV SOLN
INTRAVENOUS | Status: DC | PRN
  Administered 2022-04-15: 200 mg via INTRAVENOUS

## 2022-04-15 MED ORDER — LIDOCAINE HCL (CARDIAC) PF 50 MG/5ML IV SOSY
PREFILLED_SYRINGE | INTRAVENOUS | Status: DC | PRN
  Administered 2022-04-15: 100 mg via INTRAVENOUS

## 2022-04-15 MED ORDER — FENTANYL CITRATE (PF) 100 MCG/2ML IJ SOLN
INTRAMUSCULAR | Status: AC
  Filled 2022-04-15: qty 2

## 2022-04-15 MED ORDER — SODIUM CHLORIDE 0.9 % IR SOLN
Status: DC | PRN
  Administered 2022-04-15: 3000 mL

## 2022-04-15 MED ORDER — ROCURONIUM 10MG/ML (10ML) SYRINGE FOR MEDFUSION PUMP - OPTIME
INTRAVENOUS | Status: DC | PRN
  Administered 2022-04-15: 50 mg via INTRAVENOUS

## 2022-04-15 MED ORDER — PROPOFOL 10 MG/ML IV BOLUS
INTRAVENOUS | Status: AC
  Filled 2022-04-15: qty 20

## 2022-04-15 MED ORDER — HYDROCODONE-ACETAMINOPHEN 5-325 MG PO TABS: 1.0000 | ORAL_TABLET | Freq: Four times a day (QID) | ORAL | 0 refills | Status: DC | PRN

## 2022-04-15 MED ORDER — CEFAZOLIN SODIUM-DEXTROSE 2-4 GM/100ML-% IV SOLN
INTRAVENOUS | Status: AC
  Filled 2022-04-15: qty 100

## 2022-04-15 MED ORDER — PROPOFOL 10 MG/ML IV BOLUS
INTRAVENOUS | Status: DC | PRN
  Administered 2022-04-15: 200 mg via INTRAVENOUS

## 2022-04-15 MED ORDER — CHLORHEXIDINE GLUCONATE 0.12 % MT SOLN
15.0000 mL | Freq: Once | OROMUCOSAL | Status: AC
  Administered 2022-04-15: 15 mL via OROMUCOSAL

## 2022-04-15 MED ORDER — ORAL CARE MOUTH RINSE: 15.0000 mL | Freq: Once | OROMUCOSAL | Status: AC

## 2022-04-15 MED ORDER — HYDROMORPHONE HCL 1 MG/ML IJ SOLN: 0.2500 mg | INTRAMUSCULAR | Status: DC | PRN

## 2022-04-15 MED ORDER — STERILE WATER FOR IRRIGATION IR SOLN
Status: DC | PRN
  Administered 2022-04-15: 500 mL

## 2022-04-15 MED ORDER — FENTANYL CITRATE (PF) 100 MCG/2ML IJ SOLN
INTRAMUSCULAR | Status: DC | PRN
  Administered 2022-04-15 (×2): 50 ug via INTRAVENOUS

## 2022-04-15 MED ORDER — ONDANSETRON HCL 4 MG/2ML IJ SOLN: 4.0000 mg | Freq: Once | INTRAMUSCULAR | Status: DC | PRN

## 2022-04-15 MED ORDER — LACTATED RINGERS IV SOLN: INTRAVENOUS | Status: DC

## 2022-04-15 MED ORDER — CEFAZOLIN SODIUM-DEXTROSE 2-4 GM/100ML-% IV SOLN
2.0000 g | INTRAVENOUS | Status: AC
  Administered 2022-04-15: 2 g via INTRAVENOUS

## 2022-04-15 SURGICAL SUPPLY — 25 items
BAG DRAIN URO TABLE W/ADPT NS (BAG) ×1 IMPLANT
BAG DRN 8 ADPR NS SKTRN CSTL (BAG) ×1
BAG DRN RND TRDRP ANRFLXCHMBR (UROLOGICAL SUPPLIES) ×1
BAG HAMPER (MISCELLANEOUS) ×1 IMPLANT
BAG URINE DRAIN 2000ML AR STRL (UROLOGICAL SUPPLIES) ×1 IMPLANT
CATH FOLEY LATEX FREE 20FR (CATHETERS) ×1
CATH FOLEY LF 20FR (CATHETERS) IMPLANT
CLOTH BEACON ORANGE TIMEOUT ST (SAFETY) ×1 IMPLANT
ELECT LOOP 22F BIPOLAR SML (ELECTROSURGICAL) ×1
ELECTRODE LOOP 22F BIPOLAR SML (ELECTROSURGICAL) ×1 IMPLANT
GLOVE BIO SURGEON STRL SZ8 (GLOVE) ×1 IMPLANT
GLOVE BIOGEL PI IND STRL 7.0 (GLOVE) ×2 IMPLANT
GOWN STRL REUS W/TWL LRG LVL3 (GOWN DISPOSABLE) ×2 IMPLANT
GOWN STRL REUS W/TWL XL LVL3 (GOWN DISPOSABLE) ×1 IMPLANT
IV NS IRRIG 3000ML ARTHROMATIC (IV SOLUTION) ×2 IMPLANT
KIT TURNOVER CYSTO (KITS) ×1 IMPLANT
MANIFOLD NEPTUNE II (INSTRUMENTS) ×1 IMPLANT
PACK CYSTO (CUSTOM PROCEDURE TRAY) ×1 IMPLANT
PAD ARMBOARD 7.5X6 YLW CONV (MISCELLANEOUS) ×1 IMPLANT
SYR 30ML LL (SYRINGE) ×1 IMPLANT
SYR TOOMEY IRRIG 70ML (MISCELLANEOUS) ×1
SYRINGE TOOMEY IRRIG 70ML (MISCELLANEOUS) ×1 IMPLANT
TOWEL NATURAL 4PK STERILE (DISPOSABLE) ×1 IMPLANT
TOWEL OR 17X26 4PK STRL BLUE (TOWEL DISPOSABLE) ×1 IMPLANT
WATER STERILE IRR 500ML POUR (IV SOLUTION) ×1 IMPLANT

## 2022-04-15 NOTE — Anesthesia Postprocedure Evaluation (Signed)
Anesthesia Post Note  Patient: Geoffrey Patterson  Procedure(s) Performed: CYSTOSCOPY TRANSURETHRAL RESECTION OF BLADDER TUMOR (TURBT)  Patient location during evaluation: Phase II Anesthesia Type: General Level of consciousness: awake and alert Pain management: pain level controlled Vital Signs Assessment: post-procedure vital signs reviewed and stable Respiratory status: spontaneous breathing, nonlabored ventilation and respiratory function stable Cardiovascular status: blood pressure returned to baseline and stable Postop Assessment: no apparent nausea or vomiting Anesthetic complications: no   No notable events documented.   Last Vitals:  Vitals:   04/15/22 1030 04/15/22 1051  BP: 119/71   Pulse: 64 65  Resp: 15 16  Temp:  36.6 C  SpO2: 96%     Last Pain:  Vitals:   04/15/22 1051  TempSrc: Oral  PainSc:                  Naoko Diperna C Georganna Maxson

## 2022-04-15 NOTE — Anesthesia Preprocedure Evaluation (Addendum)
Anesthesia Evaluation  Patient identified by MRN, date of birth, ID band Patient awake    Reviewed: Allergy & Precautions, H&P , NPO status , Patient's Chart, lab work & pertinent test results  Airway Mallampati: III  TM Distance: >3 FB Neck ROM: Full    Dental  (+) Dental Advisory Given, Implants   Pulmonary shortness of breath and with exertion, pneumonia, former smoker   Pulmonary exam normal breath sounds clear to auscultation       Cardiovascular Exercise Tolerance: Good hypertension, Pt. on medications + dysrhythmias Atrial Fibrillation  Rhythm:Irregular Rate:Normal  14-Mar-2022 15:05:12 [facility] ROUTINE RECORD [id number] (64 yr) Male Caucasian Vent. rate 70 BPM PR interval ms QRS duration 94 ms QT/QTcB 420/454 ms P-R-T axes 67 45 Atrial fibrillation Anteroseptal infarct, age indeterminate Confirmed by Georgina Snell [phone]) on 03/14/2022 3:08:30 PM   Neuro/Psych negative neurological ROS  negative psych ROS   GI/Hepatic negative GI ROS, Neg liver ROS,,,  Endo/Other  negative endocrine ROS    Renal/GU Renal Insufficiency and ARFRenal disease  negative genitourinary   Musculoskeletal Narrative & Impression CLINICAL DATA:  Fall on 02/15/2022.  Left proximal humeral fracture.   EXAM: CT OF THE UPPER LEFT EXTREMITY WITHOUT CONTRAST   TECHNIQUE: Multidetector CT imaging of the upper left extremity was performed according to the standard protocol.   RADIATION DOSE REDUCTION: This exam was performed according to the departmental dose-optimization program which includes automated exposure control, adjustment of the mA and/or kV according to patient size and/or use of iterative reconstruction technique.   COMPARISON:  Radiograph dated February 18, 2022   FINDINGS: Bones/Joint/Cartilage   There is a comminuted impaction fracture of the left proximal humerus involving the femoral  neck, head, greater and lesser tuberosities. There is approximately 2 cm superior displacement of the humeral neck/shaft. There is approximately 1.5 cm posterior displacement of the humeral head. The fracture extends into the articular surface of the humeral head. No appreciable fracture of the scapula. No appreciable rib fracture.   Mild arthritic changes of the acromioclavicular joint and glenohumeral joints.   Ligaments   Suboptimally assessed by CT.   Muscles and Tendons   Soft tissue swelling and edema about the comminuted fracture. Muscles and tendons appear intact.   Soft tissues   No significant hematoma or fluid collection.   IMPRESSION: 1. Comminuted impaction fracture of the left proximal humerus involving femoral neck, head, greater and lesser tuberosities with 2 cm superior displacement of the humeral neck as well as 1.5 cm posterior displacement of the humeral head. The findings likely represent 3 part fracture (Neer classification).   2. Mild arthritic changes of the acromioclavicular and glenohumeral joints.   3.  Soft tissue swelling and edema about the comminuted fracture.     Electronically Signed   By: Keane Police D.O.   On: 02/26/2022 17:58     Abdominal   Peds negative pediatric ROS (+)  Hematology negative hematology ROS (+)   Anesthesia Other Findings Study Conclusions   - Left ventricle: The cavity size was normal. Wall thickness was    normal. Systolic function was normal. The estimated ejection    fraction was in the range of 60% to 65%. Wall motion was normal;    there were no regional wall motion abnormalities. The study was    not technically sufficient to allow evaluation of LV diastolic    dysfunction due to atrial fibrillation.  - Aorta: Mild aortic root dilatation.  - Left atrium: The  atrium was moderately to severely dilated.  - Tricuspid valve: There was mild regurgitation.    Reproductive/Obstetrics negative OB  ROS                             Anesthesia Physical Anesthesia Plan  ASA: 3  Anesthesia Plan: General   Post-op Pain Management: Dilaudid IV   Induction: Intravenous  PONV Risk Score and Plan: 4 or greater and Ondansetron and Dexamethasone  Airway Management Planned: Oral ETT  Additional Equipment:   Intra-op Plan:   Post-operative Plan: Extubation in OR  Informed Consent: I have reviewed the patients History and Physical, chart, labs and discussed the procedure including the risks, benefits and alternatives for the proposed anesthesia with the patient or authorized representative who has indicated his/her understanding and acceptance.     Dental advisory given  Plan Discussed with: CRNA and Surgeon  Anesthesia Plan Comments:         Anesthesia Quick Evaluation

## 2022-04-15 NOTE — Interval H&P Note (Signed)
History and Physical Interval Note:  04/15/2022 8:51 AM  Geoffrey Patterson  has presented today for surgery, with the diagnosis of bladder tumor.  The various methods of treatment have been discussed with the patient and family. After consideration of risks, benefits and other options for treatment, the patient has consented to  Procedure(s): CYSTOSCOPY (N/A) TRANSURETHRAL RESECTION OF BLADDER TUMOR (TURBT) (N/A) as a surgical intervention.  The patient's history has been reviewed, patient examined, no change in status, stable for surgery.  I have reviewed the patient's chart and labs.  Questions were answered to the patient's satisfaction.     [name]

## 2022-04-15 NOTE — Transfer of Care (Signed)
Immediate Anesthesia Transfer of Care Note  Patient: Geoffrey Patterson  Procedure(s) Performed: CYSTOSCOPY TRANSURETHRAL RESECTION OF BLADDER TUMOR (TURBT)  Patient Location: PACU  Anesthesia Type:General  Level of Consciousness: awake  Airway & Oxygen Therapy: Patient Spontanous Breathing  Post-op Assessment: Report given to RN  Post vital signs: Reviewed  Last Vitals:  Vitals Value Taken Time  BP 117/64 04/15/22 1005  Temp 97.8   Pulse 67 04/15/22 1009  Resp 18 04/15/22 1009  SpO2 93 % 04/15/22 1009  Vitals shown include unvalidated device data.  Last Pain:  Vitals:   04/15/22 0800  PainSc: 0-No pain         Complications: No notable events documented.

## 2022-04-15 NOTE — Anesthesia Procedure Notes (Signed)
Procedure Name: Intubation Date/Time: 04/15/2022 9:22 AM  Performed by: Ollen Bowl, CRNAPre-anesthesia Checklist: Patient identified, Patient being monitored, Timeout performed, Emergency Drugs available and Suction available Patient Re-evaluated:Patient Re-evaluated prior to induction Oxygen Delivery Method: Circle system utilized Preoxygenation: Pre-oxygenation with 100% oxygen Induction Type: IV induction Ventilation: Mask ventilation without difficulty Laryngoscope Size: Mac and 3 Grade View: Grade I Tube type: Oral Tube size: 8.0 mm Number of attempts: 1 Airway Equipment and Method: Stylet Placement Confirmation: ETT inserted through vocal cords under direct vision, positive ETCO2 and breath sounds checked- equal and bilateral Secured at: 21 cm Tube secured with: Tape Dental Injury: Teeth and Oropharynx as per pre-operative assessment

## 2022-04-15 NOTE — Op Note (Signed)
.  Preoperative diagnosis: bladder tumor  Postoperative diagnosis: Same  Procedure: 1 cystoscopy 2. Transurethral resection of bladder tumor, large  Attending: Rosie Fate  Anesthesia: General  Estimated blood loss: Minimal  Drains: 22 French foley  Specimens: posterior wall bladder tumor  Antibiotics: ancef  Findings: multiple areas of irritation from foley catheter. 6cm papillary left lateral wall tumor.  Ureteral orifices in normal anatomic location. Nor hydronephrosis or filling defects in either collecting system  Indications: Patient is a 71 year old male with a history of bladder tumor found on bladder US and confirmed on office cystoscopy.  After discussing treatment options, they decided proceed with transurethral resection of a bladder tumor.  Procedure in detail: The patient was brought to the operating room and a brief timeout was done to ensure correct patient, correct procedure, correct site.  General anesthesia was administered patient was placed in dorsal lithotomy position.  Their genitalia was then prepped and draped in usual sterile fashion.  A rigid 34 French cystoscope was passed in the urethra and the bladder.  Bladder was inspected and we noted a 6cm bladder tumor.  the ureteral orifices were in the normal orthotopic locations.  . We then removed the cystoscope and placed a resectoscope into the bladder. We proceeded to remove the large clot burden from the bladder. Once this was complete we turned our attention to the bladder tumor. Using the bipolar resectoscope we removed the bladder tumor down to the base. A subsequent muscle deep biopsy was then taken. Hemostasis was then obtained with electrocautery. We then removed the bladder tumor chips and sent them for pathology. We then re-inspected the bladder and found no residula bleeding.  the bladder was then drained, a 22 French foley was placed and this concluded the procedure which was well tolerated by  patient.  Complications: None  Condition: Stable, extubated, transferred to PACU  Plan: Patient is to be discharged home and followup in 5 days for foley catheter removal and pathology discussion.

## 2022-04-16 LAB — SURGICAL PATHOLOGY

## 2022-04-17 ENCOUNTER — Other Ambulatory Visit: Payer: Self-pay | Admitting: Adult Health

## 2022-04-19 DIAGNOSIS — M19012 Primary osteoarthritis, left shoulder: Secondary | ICD-10-CM | POA: Diagnosis not present

## 2022-04-21 ENCOUNTER — Encounter (HOSPITAL_COMMUNITY): Payer: Self-pay | Admitting: Urology

## 2022-04-21 ENCOUNTER — Ambulatory Visit: Payer: Medicare HMO | Admitting: Physician Assistant

## 2022-04-21 VITALS — BP 121/89 | HR 102

## 2022-04-21 DIAGNOSIS — N3080 Other cystitis without hematuria: Secondary | ICD-10-CM

## 2022-04-21 DIAGNOSIS — N3289 Other specified disorders of bladder: Secondary | ICD-10-CM | POA: Diagnosis not present

## 2022-04-21 DIAGNOSIS — R338 Other retention of urine: Secondary | ICD-10-CM | POA: Diagnosis not present

## 2022-04-21 LAB — BLADDER SCAN AMB NON-IMAGING: Scan Result: 400

## 2022-04-21 MED ORDER — SILODOSIN 8 MG PO CAPS: 8.0000 mg | ORAL_CAPSULE | Freq: Every day | ORAL | 0 refills | Status: DC

## 2022-04-21 NOTE — Progress Notes (Signed)
Fill and Pull Catheter Removal  Patient is present today for a catheter removal.  Patient was cleaned and prepped in a sterile fashion 363m of sterile water/ saline was instilled into the bladder when the patient felt the urge to urinate. 141mof water was then drained from the balloon.  A 20FR foley cath was removed from the bladder no complications were noted .  Patient as then given some time to void on their own.  Patient cannot void  56m59mn their own after some time.  Patient tolerated well.  Performed by: ShaMarisue BrooklynMA  Follow up/ Additional notes: Follow up    post void residual =400  Simple Catheter Placement  Due to urinary retention patient is present today for a foley cath placement.  Patient was cleaned and prepped in a sterile fashion with betadine and 2% lidocaine jelly was instilled into the urethra. A 20 FR foley catheter was inserted, urine return was noted  500 ml, urine was yellow in color.  The balloon was filled with 10cc of sterile water.  A night bag was attached for drainage. Patient was also given a night bag to take home and was given instruction on how to change from one bag to another.  Patient was given instruction on proper catheter care.  Patient tolerated well, no complications were noted   Performed by: ShaMarisue BrooklynMA  Additional notes/ Follow up: follow up as scheduled

## 2022-04-21 NOTE — Progress Notes (Signed)
Assessment: 1. Bladder mass  2. Polypoid cystitis    Plan: FU in 3 months for repeat cysto. Continue current meds.  Patient is advised to return to the office around 230 this afternoon if he unable to void or develops pain with inability to void before that.-  Chief Complaint: No chief complaint on file.   HPI: Geoffrey Patterson is a 71 y.o. male who presents for post op evaluation of bladder tumor.  He is 6 days status post TURBT.  Foley remains and voiding trial as scheduled.  Path report diagnosis is polypoid cystitis.  Initial gross hematuria has resolved.  Path report reviewed with the patient.  Portions of the above documentation were copied from a prior visit for review purposes only.  Allergies: No Known Allergies  PMH: Past Medical History:  Diagnosis Date   Dysrhythmia    a-fib history   Gout    Hypertension     PSH: Past Surgical History:  Procedure Laterality Date   COLONOSCOPY     COLONOSCOPY N/A 12/30/2016   Procedure: COLONOSCOPY;  Surgeon: Rogene Houston, MD;  [facility];  Service: Endoscopy;  Laterality: N/A;  1030   CYSTOSCOPY     POLYPECTOMY  12/30/2016   Procedure: POLYPECTOMY;  Surgeon: Rogene Houston, MD;  [facility];  Service: Endoscopy;;  colon    REVERSE SHOULDER ARTHROPLASTY Left 03/07/2022   Procedure: REVERSE SHOULDER ARTHROPLASTY;  Surgeon: Marchia Bond, MD;  Location: [facility];  Service: Orthopedics;  Laterality: Left;   TONSILLECTOMY      SH: Social History   Tobacco Use   Smoking status: Former    Types: Cigarettes   Smokeless tobacco: Never  Vaping Use   Vaping Use: Never used  Substance Use Topics   Alcohol use: No   Drug use: No    ROS: All other review of systems were reviewed and are negative except what is noted above in HPI  PE: BP 121/89   Pulse (!) 102  GENERAL APPEARANCE:  Well appearing, well developed, NAD HEENT:  Atraumatic, normocephalic NECK:  Supple. Trachea  midline ABDOMEN:  Soft, non-tender, no masses, no CVAT EXTREMITIES:  Moves all extremities well NEUROLOGIC:  Alert and oriented x 3 MENTAL STATUS:  appropriate SKIN:  Warm, dry, and intact   Results: Laboratory Data: Lab Results  Component Value Date   WBC 9.3 03/22/2022   HGB 11.0 (L) 03/22/2022   HCT 32.4 (L) 03/22/2022   MCV 95.0 03/22/2022   PLT 184 03/22/2022    Lab Results  Component Value Date   CREATININE 0.88 03/22/2022     Urinalysis    Component Value Date/Time   COLORURINE AMBER (A) 03/14/2022 1309   APPEARANCEUR HAZY (A) 03/14/2022 1309   LABSPEC 1.010 03/14/2022 1309   PHURINE 6.0 03/14/2022 1309   GLUCOSEU NEGATIVE 03/14/2022 1309   HGBUR LARGE (A) 03/14/2022 1309   BILIRUBINUR NEGATIVE 03/14/2022 1309   KETONESUR NEGATIVE 03/14/2022 1309   PROTEINUR 100 (A) 03/14/2022 1309   NITRITE NEGATIVE 03/14/2022 1309   LEUKOCYTESUR LARGE (A) 03/14/2022 1309    Lab Results  Component Value Date   BACTERIA NONE SEEN 03/14/2022    Pertinent Imaging: No results found for this or any previous visit.  No results found for this or any previous visit.  No results found for this or any previous visit.  No results found for this or any previous visit.  Results for orders placed during the hospital encounter of 03/14/22  US Renal  Narrative CLINICAL DATA:  Acute kidney injury.  EXAM: RENAL / URINARY TRACT ULTRASOUND COMPLETE  COMPARISON:  None Available.  FINDINGS: Right Kidney:  Renal measurements: 12.6 x 7.6 x 6.1 cm = volume: 305 mL. Echogenicity within normal limits. No mass or hydronephrosis visualized.  Left Kidney:  Renal measurements: 13.1 x 7.9 x 6.6 cm = volume: 358 mL. Echogenicity within normal limits. No mass or hydronephrosis visualized.  Bladder:  There appears to be a solid mass in the urinary bladder measuring 6.2 x 5.5 x 2.9 cm.  Other:  None.  IMPRESSION: Kidneys are unremarkable. However, there appears to be a  solid mass in the urinary bladder measuring 6.2 x 5.5 x 2.9 cm. Further evaluation with CT scan or cystoscopy is recommended. These results will be called to the ordering clinician or representative by the Radiologist Assistant, and communication documented in the PACS or zVision Dashboard.   Electronically Signed By: Marijo Conception M.D. On: 03/15/2022 13:17  No valid procedures specified. No results found for this or any previous visit.  No results found for this or any previous visit.  No results found for this or any previous visit (from the past 24 hour(s)).

## 2022-04-21 NOTE — Patient Instructions (Addendum)
Hold tamsulosin(Flomax)  Start silodocin (Rapaflo)   Miralax nightly and Senakot-S over the counter as needed for constipation

## 2022-04-27 DIAGNOSIS — Z8701 Personal history of pneumonia (recurrent): Secondary | ICD-10-CM | POA: Diagnosis not present

## 2022-04-27 DIAGNOSIS — Z9181 History of falling: Secondary | ICD-10-CM | POA: Diagnosis not present

## 2022-04-27 DIAGNOSIS — Z87440 Personal history of urinary (tract) infections: Secondary | ICD-10-CM | POA: Diagnosis not present

## 2022-04-27 DIAGNOSIS — S42302D Unspecified fracture of shaft of humerus, left arm, subsequent encounter for fracture with routine healing: Secondary | ICD-10-CM | POA: Diagnosis not present

## 2022-04-27 DIAGNOSIS — Z471 Aftercare following joint replacement surgery: Secondary | ICD-10-CM | POA: Diagnosis not present

## 2022-04-27 DIAGNOSIS — Z96612 Presence of left artificial shoulder joint: Secondary | ICD-10-CM | POA: Diagnosis not present

## 2022-04-27 DIAGNOSIS — Z8616 Personal history of COVID-19: Secondary | ICD-10-CM | POA: Diagnosis not present

## 2022-04-28 DIAGNOSIS — Z8616 Personal history of COVID-19: Secondary | ICD-10-CM | POA: Diagnosis not present

## 2022-04-28 DIAGNOSIS — Z87440 Personal history of urinary (tract) infections: Secondary | ICD-10-CM | POA: Diagnosis not present

## 2022-04-28 DIAGNOSIS — S42302D Unspecified fracture of shaft of humerus, left arm, subsequent encounter for fracture with routine healing: Secondary | ICD-10-CM | POA: Diagnosis not present

## 2022-04-28 DIAGNOSIS — Z96612 Presence of left artificial shoulder joint: Secondary | ICD-10-CM | POA: Diagnosis not present

## 2022-04-28 DIAGNOSIS — Z8701 Personal history of pneumonia (recurrent): Secondary | ICD-10-CM | POA: Diagnosis not present

## 2022-04-28 DIAGNOSIS — Z9181 History of falling: Secondary | ICD-10-CM | POA: Diagnosis not present

## 2022-04-29 ENCOUNTER — Ambulatory Visit: Payer: Medicare HMO | Admitting: Physician Assistant

## 2022-04-29 VITALS — BP 130/86 | HR 73

## 2022-04-29 DIAGNOSIS — R339 Retention of urine, unspecified: Secondary | ICD-10-CM

## 2022-04-29 DIAGNOSIS — N3289 Other specified disorders of bladder: Secondary | ICD-10-CM | POA: Diagnosis not present

## 2022-04-29 LAB — BLADDER SCAN AMB NON-IMAGING: Scan Result: 566

## 2022-04-29 NOTE — Progress Notes (Signed)
Fill and Pull Catheter Removal  Patient is present today for a catheter removal.  Patient was cleaned and prepped in a sterile fashion 221m of sterile water/ saline was instilled into the bladder when the patient felt the urge to urinate. 164mof water was then drained from the balloon.  A 20FR foley cath was removed from the bladder no complications were noted .  Patient as then given some time to void on their own.  Patient cannot void  75m37mn their own after some time.  Patient tolerated well.  Performed by: ShaMarisue BrooklynMA  Follow up/ Additional notes: Follow up this afternoon @ 2 pm for PVR   Simple Catheter Placement  Due to urinary retention patient is present today for a foley cath placement.  Patient was cleaned and prepped in a sterile fashion with betadine and 2% lidocaine jelly was instilled into the urethra. A 20 FR foley catheter was inserted, urine return was noted  600 ml, urine was  yellow in color.  The balloon was filled with 10cc of sterile water.  A overnight bag was attached for drainage. Patient was also given a night bag to take home and was given instruction on how to change from one bag to another.  Patient was given instruction on proper catheter care.  Patient tolerated well, no complications were noted   Performed by: ShaMarisue BrooklynMA  Additional notes/ Follow up: Follow up as scheduled

## 2022-04-29 NOTE — Progress Notes (Signed)
Assessment: 1. Bladder mass - Bladder Voiding Trial  2. Urinary retention    Plan: Return in pm if any issues voiding. Remain on Rapaflo. Keep FU as scheduled in 3 months for cysto.  Pt returned in pm with inability to void. Pt with >545m and foley replaced. Continue current meds and FU for cysto in 7-10 days  Chief Complaint: No chief complaint on file.   HPI: Geoffrey LIGHTNERis a 71y.o. male who presents for repeat voiding trial post TURBT performed on 04/15/22. He has tolerated the catheter well after failing voiding trial last week. He has been on silodocin. No gross hematuria. No h/o retention in the past.  Pt returned to office in retention and foley was replaced.  Portions of the above documentation were copied from a prior visit for review purposes only.  Allergies: No Known Allergies  PMH: Past Medical History:  Diagnosis Date   Dysrhythmia    a-fib history   Gout    Hypertension     PSH: Past Surgical History:  Procedure Laterality Date   COLONOSCOPY     COLONOSCOPY N/A 12/30/2016   Procedure: COLONOSCOPY;  Surgeon: RRogene Houston MD;  [facility];  Service: Endoscopy;  Laterality: N/A;  [address] and [address]/A 04/15/2022   Procedure: CYSTOSCOPY;  Surgeon: MCleon Gustin MD;  [facility];  Service: Urology;  Laterality: N/A;   POLYPECTOMY  12/30/2016   Procedure: POLYPECTOMY;  Surgeon: RRogene Houston MD;  [facility];  Service: Endoscopy;;  colon    REVERSE SHOULDER ARTHROPLASTY Left 03/07/2022   Procedure: REVERSE SHOULDER ARTHROPLASTY;  Surgeon: LMarchia Bond MD;  [place] [facility]  Service: Orthopedics;  Laterality: Left;   TONSILLECTOMY     TRANSURETHRAL RESECTION OF BLADDER TUMOR N/A 04/15/2022   Procedure: TRANSURETHRAL RESECTION OF BLADDER TUMOR (TURBT);  Surgeon: MCleon Gustin MD;  [facility];  Service: Urology;  Laterality: N/A;    SH: Social History   Tobacco Use    Smoking status: Former    Types: Cigarettes   Smokeless tobacco: Never  Vaping Use   Vaping Use: Never used  Substance Use Topics   Alcohol use: No   Drug use: No    ROS: All other review of systems were reviewed and are negative except what is noted above in HPI  PE: BP 130/86   Pulse 73  GENERAL APPEARANCE:  Well appearing, well developed, NAD HEENT:  Atraumatic, normocephalic NECK:  Supple. Trachea midline ABDOMEN:  Soft, non-tender, no masses, no CVAT EXTREMITIES:  Moves all extremities well NEUROLOGIC:  Alert and oriented x 3 MENTAL STATUS:  appropriate SKIN:  Warm, dry, and intact   Results: Laboratory Data: Lab Results  Component Value Date   WBC 9.3 03/22/2022   HGB 11.0 (L) 03/22/2022   HCT 32.4 (L) 03/22/2022   MCV 95.0 03/22/2022   PLT 184 03/22/2022    Lab Results  Component Value Date   CREATININE 0.88 03/22/2022    Urinalysis    Component Value Date/Time   COLORURINE AMBER (A) 03/14/2022 1309   APPEARANCEUR HAZY (A) 03/14/2022 1309   LABSPEC 1.010 03/14/2022 1309   PHURINE 6.0 03/14/2022 1309   GLUCOSEU NEGATIVE 03/14/2022 1309   HGBUR LARGE (A) 03/14/2022 1309   BILIRUBINUR NEGATIVE 03/14/2022 1309   KETONESUR NEGATIVE 03/14/2022 1309   PROTEINUR 100 (A) 03/14/2022 1309   NITRITE NEGATIVE 03/14/2022 1309   LEUKOCYTESUR LARGE (A) 03/14/2022 1309  Lab Results  Component Value Date   BACTERIA NONE SEEN 03/14/2022    Pertinent Imaging: No results found for this or any previous visit.  No results found for this or any previous visit.  No results found for this or any previous visit.  No results found for this or any previous visit.  Results for orders placed during the hospital encounter of 03/14/22  US Renal  Narrative CLINICAL DATA:  Acute kidney injury.  EXAM: RENAL / URINARY TRACT ULTRASOUND COMPLETE  COMPARISON:  None Available.  FINDINGS: Right Kidney:  Renal measurements: 12.6 x 7.6 x 6.1 cm = volume: 305  mL. Echogenicity within normal limits. No mass or hydronephrosis visualized.  Left Kidney:  Renal measurements: 13.1 x 7.9 x 6.6 cm = volume: 358 mL. Echogenicity within normal limits. No mass or hydronephrosis visualized.  Bladder:  There appears to be a solid mass in the urinary bladder measuring 6.2 x 5.5 x 2.9 cm.  Other:  None.  IMPRESSION: Kidneys are unremarkable. However, there appears to be a solid mass in the urinary bladder measuring 6.2 x 5.5 x 2.9 cm. Further evaluation with CT scan or cystoscopy is recommended. These results will be called to the ordering clinician or representative by the Radiologist Assistant, and communication documented in the PACS or zVision Dashboard.   Electronically Signed By: Marijo Conception M.D. On: 03/15/2022 13:17  No valid procedures specified. No results found for this or any previous visit.  No results found for this or any previous visit.  No results found for this or any previous visit (from the past 24 hour(s)).

## 2022-04-30 DIAGNOSIS — S42302D Unspecified fracture of shaft of humerus, left arm, subsequent encounter for fracture with routine healing: Secondary | ICD-10-CM | POA: Diagnosis not present

## 2022-04-30 DIAGNOSIS — Z87440 Personal history of urinary (tract) infections: Secondary | ICD-10-CM | POA: Diagnosis not present

## 2022-04-30 DIAGNOSIS — Z9181 History of falling: Secondary | ICD-10-CM | POA: Diagnosis not present

## 2022-04-30 DIAGNOSIS — Z8616 Personal history of COVID-19: Secondary | ICD-10-CM | POA: Diagnosis not present

## 2022-04-30 DIAGNOSIS — Z96612 Presence of left artificial shoulder joint: Secondary | ICD-10-CM | POA: Diagnosis not present

## 2022-04-30 DIAGNOSIS — Z8701 Personal history of pneumonia (recurrent): Secondary | ICD-10-CM | POA: Diagnosis not present

## 2022-04-30 LAB — URINALYSIS, ROUTINE W REFLEX MICROSCOPIC
Bilirubin, UA: NEGATIVE
Glucose, UA: NEGATIVE
Ketones, UA: NEGATIVE
Nitrite, UA: NEGATIVE
Protein,UA: NEGATIVE
Specific Gravity, UA: 1.015 (ref 1.005–1.030)
Urobilinogen, Ur: 1 mg/dL (ref 0.2–1.0)
pH, UA: 6 (ref 5.0–7.5)

## 2022-04-30 LAB — MICROSCOPIC EXAMINATION

## 2022-05-03 DIAGNOSIS — Z8616 Personal history of COVID-19: Secondary | ICD-10-CM | POA: Diagnosis not present

## 2022-05-03 DIAGNOSIS — Z8701 Personal history of pneumonia (recurrent): Secondary | ICD-10-CM | POA: Diagnosis not present

## 2022-05-03 DIAGNOSIS — Z96612 Presence of left artificial shoulder joint: Secondary | ICD-10-CM | POA: Diagnosis not present

## 2022-05-03 DIAGNOSIS — S42302D Unspecified fracture of shaft of humerus, left arm, subsequent encounter for fracture with routine healing: Secondary | ICD-10-CM | POA: Diagnosis not present

## 2022-05-03 DIAGNOSIS — Z9181 History of falling: Secondary | ICD-10-CM | POA: Diagnosis not present

## 2022-05-03 DIAGNOSIS — Z87440 Personal history of urinary (tract) infections: Secondary | ICD-10-CM | POA: Diagnosis not present

## 2022-05-04 ENCOUNTER — Telehealth: Payer: Self-pay

## 2022-05-04 NOTE — Telephone Encounter (Signed)
Sister called to confirm that patient was told to stop Allegra at last ov. Sister made aware that no documentation was noted about stopping Allegra but message would be sent to PA to confirm. Please advise.

## 2022-05-05 DIAGNOSIS — Z9181 History of falling: Secondary | ICD-10-CM | POA: Diagnosis not present

## 2022-05-05 DIAGNOSIS — Z8701 Personal history of pneumonia (recurrent): Secondary | ICD-10-CM | POA: Diagnosis not present

## 2022-05-05 DIAGNOSIS — Z87440 Personal history of urinary (tract) infections: Secondary | ICD-10-CM | POA: Diagnosis not present

## 2022-05-05 DIAGNOSIS — Z96612 Presence of left artificial shoulder joint: Secondary | ICD-10-CM | POA: Diagnosis not present

## 2022-05-05 DIAGNOSIS — S42302D Unspecified fracture of shaft of humerus, left arm, subsequent encounter for fracture with routine healing: Secondary | ICD-10-CM | POA: Diagnosis not present

## 2022-05-05 DIAGNOSIS — Z8616 Personal history of COVID-19: Secondary | ICD-10-CM | POA: Diagnosis not present

## 2022-05-05 NOTE — Telephone Encounter (Signed)
Made patient's sister [name] aware that it will help the patient with the possibility of passing his voiding trial if he holds all allergy type medications antihistamines including the Allegra.  Per Sharee Pimple did not remove Allegra from his medication list because he may go right back on it after passing his voiding trial. Derrel Nip voiced understanding.

## 2022-05-07 DIAGNOSIS — Z87440 Personal history of urinary (tract) infections: Secondary | ICD-10-CM | POA: Diagnosis not present

## 2022-05-07 DIAGNOSIS — Z8701 Personal history of pneumonia (recurrent): Secondary | ICD-10-CM | POA: Diagnosis not present

## 2022-05-07 DIAGNOSIS — Z96612 Presence of left artificial shoulder joint: Secondary | ICD-10-CM | POA: Diagnosis not present

## 2022-05-07 DIAGNOSIS — Z8616 Personal history of COVID-19: Secondary | ICD-10-CM | POA: Diagnosis not present

## 2022-05-07 DIAGNOSIS — Z9181 History of falling: Secondary | ICD-10-CM | POA: Diagnosis not present

## 2022-05-07 DIAGNOSIS — S42302D Unspecified fracture of shaft of humerus, left arm, subsequent encounter for fracture with routine healing: Secondary | ICD-10-CM | POA: Diagnosis not present

## 2022-05-10 ENCOUNTER — Encounter: Payer: Self-pay | Admitting: Urology

## 2022-05-10 ENCOUNTER — Ambulatory Visit: Payer: Medicare HMO | Admitting: Urology

## 2022-05-10 VITALS — BP 133/81 | HR 76

## 2022-05-10 DIAGNOSIS — R339 Retention of urine, unspecified: Secondary | ICD-10-CM

## 2022-05-10 DIAGNOSIS — N3289 Other specified disorders of bladder: Secondary | ICD-10-CM

## 2022-05-10 NOTE — H&P (View-Only) (Signed)
05/10/2022 9:34 AM   [name] [birth year] [id number]  Referring provider: Celene Squibb, MD [place],  [place] [address]  Followup BPH and urinary retention   HPI: Mr Geoffrey Patterson is a 71yo here for followup for BPH and urinary retention. He has failed multiple voiding trials and is currently on rapaflo '8mg'$  and finasteride '5mg'$  daily. 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    PMH: Past Medical History:  Diagnosis Date   Dysrhythmia    a-fib history   Gout    Hypertension     Surgical History: Past Surgical History:  Procedure Laterality Date   COLONOSCOPY     COLONOSCOPY N/A 12/30/2016   Procedure: COLONOSCOPY;  Surgeon: Rogene Houston, MD;  [facility];  Service: Endoscopy;  Laterality: N/A;  [place] N/A 04/15/2022   Procedure: CYSTOSCOPY;  Surgeon: Cleon Gustin, MD;  Location: [facility];  Service: Urology;  Laterality: N/A;   POLYPECTOMY  12/30/2016   Procedure: POLYPECTOMY;  Surgeon: Rogene Houston, MD;  [facility];  Service: Endoscopy;;  colon    REVERSE SHOULDER ARTHROPLASTY Left 03/07/2022   Procedure: REVERSE SHOULDER ARTHROPLASTY;  Surgeon: Marchia Bond, MD;  Location: [facility];  Service: Orthopedics;  Laterality: Left;   TONSILLECTOMY     TRANSURETHRAL RESECTION OF BLADDER TUMOR N/A 04/15/2022   Procedure: TRANSURETHRAL RESECTION OF BLADDER TUMOR (TURBT);  Surgeon: Cleon Gustin, MD;  Location: [facility];  Service: Urology;  Laterality: N/A;    Home Medications:  Allergies as of 05/10/2022   No Known Allergies      Medication List        Accurate as of May 10, 2022  9:34 AM. If you have any questions, ask your nurse or doctor.          acetaminophen 325 MG tablet Commonly known as: TYLENOL Take 650 mg by mouth every 6 (six) hours as needed for mild pain.   allopurinol 300 MG tablet Commonly known as: ZYLOPRIM Take 1 tablet (300 mg total) by mouth daily.    diltiazem 120 MG 24 hr capsule Commonly known as: Cardizem CD Take 1 capsule (120 mg total) by mouth daily.   feeding supplement (PRO-STAT SUGAR FREE 64) Liqd Take 30 mLs by mouth 3 (three) times daily with meals.   fexofenadine 180 MG tablet Commonly known as: ALLEGRA Take 1 tablet (180 mg total) by mouth daily.   finasteride 5 MG tablet Commonly known as: PROSCAR Take 1 tablet (5 mg total) by mouth daily.   HYDROcodone-acetaminophen 5-325 MG tablet Commonly known as: Norco Take 1 tablet by mouth every 6 (six) hours as needed for moderate pain.   melatonin 3 MG Tabs tablet Take 3 mg by mouth at bedtime.   rosuvastatin 5 MG tablet Commonly known as: CRESTOR Take 1 tablet (5 mg total) by mouth daily.   sennosides-docusate sodium 8.6-50 MG tablet Commonly known as: SENOKOT-S Take 2 tablets by mouth daily.   silodosin 8 MG Caps capsule Commonly known as: RAPAFLO Take 1 capsule (8 mg total) by mouth daily with breakfast.   valACYclovir 1000 MG tablet Commonly known as: VALTREX Take 2 tablets (2,000 mg total) by mouth 2 (two) times daily as needed (fever blisters).        Allergies: No Known Allergies  Family History: Family History  Problem Relation Age of Onset   Hypertension Mother    Hypertension Father  Diabetic kidney disease Father     Social History:  reports that he has quit smoking. His smoking use included cigarettes. He has never used smokeless tobacco. He reports that he does not drink alcohol and does not use drugs.  ROS: All other review of systems were reviewed and are negative except what is noted above in HPI  Physical Exam: BP 133/81   Pulse 76   Constitutional:  Alert and oriented, No acute distress. HEENT: [place] AT, moist mucus membranes.  Trachea midline, no masses. Cardiovascular: No clubbing, cyanosis, or edema. Respiratory: Normal respiratory effort, no increased work of breathing. GI: Abdomen is soft, nontender, nondistended, no  abdominal masses GU: No CVA tenderness.  Lymph: No cervical or inguinal lymphadenopathy. Skin: No rashes, bruises or suspicious lesions. Neurologic: Grossly intact, no focal deficits, moving all 4 extremities. Psychiatric: Normal mood and affect.  Laboratory Data: Lab Results  Component Value Date   WBC 9.3 03/22/2022   HGB 11.0 (L) 03/22/2022   HCT 32.4 (L) 03/22/2022   MCV 95.0 03/22/2022   PLT 184 03/22/2022    Lab Results  Component Value Date   CREATININE 0.88 03/22/2022    No results found for: "PSA"  No results found for: "TESTOSTERONE"  No results found for: "HGBA1C"  Urinalysis    Component Value Date/Time   COLORURINE AMBER (A) 03/14/2022 1309   APPEARANCEUR Hazy (A) 04/29/2022 1516   LABSPEC 1.010 03/14/2022 1309   PHURINE 6.0 03/14/2022 1309   GLUCOSEU Negative 04/29/2022 1516   HGBUR LARGE (A) 03/14/2022 1309   BILIRUBINUR Negative 04/29/2022 1516   KETONESUR NEGATIVE 03/14/2022 1309   PROTEINUR Negative 04/29/2022 1516   PROTEINUR 100 (A) 03/14/2022 1309   NITRITE Negative 04/29/2022 1516   NITRITE NEGATIVE 03/14/2022 1309   LEUKOCYTESUR 2+ (A) 04/29/2022 1516   LEUKOCYTESUR LARGE (A) 03/14/2022 1309    Lab Results  Component Value Date   LABMICR See below: 04/29/2022   WBCUA 11-30 (A) 04/29/2022   LABEPIT 0-10 04/29/2022   BACTERIA Few 04/29/2022    Pertinent Imaging:  No results found for this or any previous visit.  No results found for this or any previous visit.  No results found for this or any previous visit.  No results found for this or any previous visit.  Results for orders placed during the hospital encounter of 03/14/22  US Renal  Narrative CLINICAL DATA:  Acute kidney injury.  EXAM: RENAL / URINARY TRACT ULTRASOUND COMPLETE  COMPARISON:  None Available.  FINDINGS: Right Kidney:  Renal measurements: 12.6 x 7.6 x 6.1 cm = volume: 305 mL. Echogenicity within normal limits. No mass or  hydronephrosis visualized.  Left Kidney:  Renal measurements: 13.1 x 7.9 x 6.6 cm = volume: 358 mL. Echogenicity within normal limits. No mass or hydronephrosis visualized.  Bladder:  There appears to be a solid mass in the urinary bladder measuring 6.2 x 5.5 x 2.9 cm.  Other:  None.  IMPRESSION: Kidneys are unremarkable. However, there appears to be a solid mass in the urinary bladder measuring 6.2 x 5.5 x 2.9 cm. Further evaluation with CT scan or cystoscopy is recommended. These results will be called to the ordering clinician or representative by the Radiologist Assistant, and communication documented in the PACS or zVision Dashboard.   Electronically Signed By: Marijo Conception M.D. On: 03/15/2022 13:17  No valid procedures specified. No results found for this or any previous visit.  No results found for this or any previous visit.  Assessment & Plan:    1. Urinary retention We discussed the management of his BPH including continued medical therapy, Rezum, Urolift, TURP and simple prostatectomy. After discussing the options the patient has elected to proceed with Urolift. Risks/benefits/alternatives discussed.    No follow-ups on file.  [name], MD  [facility] [place]

## 2022-05-10 NOTE — Patient Instructions (Addendum)
Dear Mr. Geoffrey Patterson,   Thank you for choosing [facility] [place] to assist in your urologic care for your upcoming surgery. The following information below includes specific dates and details related to surgery:   The Surgical Procedure you are scheduled to have performed is Urolift   Surgery Date: 05/27/2022   Physician performing the surgery: Dr. [name]   Do not eat or drink after midnight the day before your surgery.    You will need a driver the day of surgery and will not be able to operate heavy machinery for 24 hours after.    Your surgery will be performed at  [facility] [address], [place] [address]   Enter at the Main Entrance and check in at the [place] desk.     Pre-Admit Testing Info   Pre- Admit appointments are interview with an anesthesiologist or a pre-operative anesthesia nurse. These appointments are typically completed as an in person visit but can take place over the telephone.  You will be contacted to confirm the date and time window.    If you have any questions or concerns, please don't hesitate to call the office at [phone]   Thank you,    Gibson Ramp, RN Clinical Surgery Coordinator [facility]    Benign Prostatic Hyperplasia  Benign prostatic hyperplasia (BPH) is an enlarged prostate gland that is caused by the normal aging process. The prostate may get bigger as a man gets older. The condition is not caused by cancer. The prostate is a walnut-sized gland that is involved in the production of semen. It is located in front of the rectum and below the bladder. The bladder stores urine. The urethra carries stored urine out of the body. An enlarged prostate can press on the urethra. This can make it harder to pass urine. The buildup of urine in the bladder can cause infection. Back pressure and infection may progress to bladder damage and kidney (renal) failure. What are the  causes? This condition is part of the normal aging process. However, not all men develop problems from this condition. If the prostate enlarges away from the urethra, urine flow will not be blocked. If it enlarges toward the urethra and compresses it, there will be problems passing urine. What increases the risk? This condition is more likely to develop in men older than 50 years. What are the signs or symptoms? Symptoms of this condition include: Getting up often during the night to urinate. Needing to urinate frequently during the day. Difficulty starting urine flow. Decrease in size and strength of your urine stream. Leaking (dribbling) after urinating. Inability to pass urine. This needs immediate treatment. Inability to completely empty your bladder. Pain when you pass urine. This is more common if there is also an infection. Urinary tract infection (UTI). How is this diagnosed? This condition is diagnosed based on your medical history, a physical exam, and your symptoms. Tests will also be done, such as: A post-void bladder scan. This measures any amount of urine that may remain in your bladder after you finish urinating. A digital rectal exam. In a rectal exam, your health care provider checks your prostate by putting a lubricated, gloved finger into your rectum to feel the back of your prostate gland. This exam detects the size of your gland and any abnormal lumps or growths. An exam of your urine (urinalysis). A prostate specific antigen (PSA) screening. This is a blood test used to  screen for prostate cancer. An ultrasound. This test uses sound waves to electronically produce a picture of your prostate gland. Your health care provider may refer you to a specialist in kidney and prostate diseases (urologist). How is this treated? Once symptoms begin, your health care provider will monitor your condition (active surveillance or watchful waiting). Treatment for this condition will  depend on the severity of your condition. Treatment may include: Observation and yearly exams. This may be the only treatment needed if your condition and symptoms are mild. Medicines to relieve your symptoms, including: Medicines to shrink the prostate. Medicines to relax the muscle of the prostate. Surgery in severe cases. Surgery may include: Prostatectomy. In this procedure, the prostate tissue is removed completely through an open incision or with a laparoscope or robotics. Transurethral resection of the prostate (TURP). In this procedure, a tool is inserted through the opening at the tip of the penis (urethra). It is used to cut away tissue of the inner core of the prostate. The pieces are removed through the same opening of the penis. This removes the blockage. Transurethral incision (TUIP). In this procedure, small cuts are made in the prostate. This lessens the prostate's pressure on the urethra. Transurethral microwave thermotherapy (TUMT). This procedure uses microwaves to create heat. The heat destroys and removes a small amount of prostate tissue. Transurethral needle ablation (TUNA). This procedure uses radio frequencies to destroy and remove a small amount of prostate tissue. Interstitial laser coagulation ([place]). This procedure uses a laser to destroy and remove a small amount of prostate tissue. Transurethral electrovaporization (TUVP). This procedure uses electrodes to destroy and remove a small amount of prostate tissue. Prostatic urethral lift. This procedure inserts an implant to push the lobes of the prostate away from the urethra. Follow these instructions at home: Take over-the-counter and prescription medicines only as told by your health care provider. Monitor your symptoms for any changes. Contact your health care provider with any changes. Avoid drinking large amounts of liquid before going to bed or out in public. Avoid or reduce how much caffeine or alcohol you  drink. Give yourself time when you urinate. Keep all follow-up visits. This is important. Contact a health care provider if: You have unexplained back pain. Your symptoms do not get better with treatment. You develop side effects from the medicine you are taking. Your urine becomes very dark or has a bad smell. Your lower abdomen becomes distended and you have trouble passing urine. Get help right away if: You have a fever or chills. You suddenly cannot urinate. You feel light-headed or very dizzy, or you faint. There are large amounts of blood or clots in your urine. Your urinary problems become hard to manage. You develop moderate to severe low back or flank pain. The flank is the side of your body between the ribs and the hip. These symptoms may be an emergency. Get help right away. Call 911. Do not wait to see if the symptoms will go away. Do not drive yourself to the hospital. Summary Benign prostatic hyperplasia (BPH) is an enlarged prostate that is caused by the normal aging process. It is not caused by cancer. An enlarged prostate can press on the urethra. This can make it hard to pass urine. This condition is more likely to develop in men older than 50 years. Get help right away if you suddenly cannot urinate. This information is not intended to replace advice given to you by your health care provider. Make sure  you discuss any questions you have with your health care provider. Document Revised: 12/17/2020 Document Reviewed: 12/17/2020 Elsevier Patient Education  [place].

## 2022-05-10 NOTE — Progress Notes (Signed)
05/10/2022 9:34 AM   Geoffrey Patterson 1950-11-21 [id number]  Referring provider: Celene Squibb, MD [place],  [place] [address]  Followup BPH and urinary retention   HPI: Geoffrey Patterson is a 71yo here for followup for BPH and urinary retention. He has failed multiple voiding trials and is currently on rapaflo '8mg'$  and finasteride '5mg'$  daily. 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    PMH: Past Medical History:  Diagnosis Date   Dysrhythmia    a-fib history   Gout    Hypertension     Surgical History: Past Surgical History:  Procedure Laterality Date   COLONOSCOPY     COLONOSCOPY N/A 12/30/2016   Procedure: COLONOSCOPY;  Surgeon: Rogene Houston, MD;  [facility];  Service: Endoscopy;  Laterality: N/A;  [place] N/A 04/15/2022   Procedure: CYSTOSCOPY;  Surgeon: Cleon Gustin, MD;  Location: [facility];  Service: Urology;  Laterality: N/A;   POLYPECTOMY  12/30/2016   Procedure: POLYPECTOMY;  Surgeon: Rogene Houston, MD;  [facility];  Service: Endoscopy;;  colon    REVERSE SHOULDER ARTHROPLASTY Left 03/07/2022   Procedure: REVERSE SHOULDER ARTHROPLASTY;  Surgeon: Marchia Bond, MD;  Location: [facility];  Service: Orthopedics;  Laterality: Left;   TONSILLECTOMY     TRANSURETHRAL RESECTION OF BLADDER TUMOR N/A 04/15/2022   Procedure: TRANSURETHRAL RESECTION OF BLADDER TUMOR (TURBT);  Surgeon: Cleon Gustin, MD;  Location: [facility];  Service: Urology;  Laterality: N/A;    Home Medications:  Allergies as of 05/10/2022   No Known Allergies      Medication List        Accurate as of May 10, 2022  9:34 AM. If you have any questions, ask your nurse or doctor.          acetaminophen 325 MG tablet Commonly known as: TYLENOL Take 650 mg by mouth every 6 (six) hours as needed for mild pain.   allopurinol 300 MG tablet Commonly known as: ZYLOPRIM Take 1 tablet (300 mg total) by mouth daily.    diltiazem 120 MG 24 hr capsule Commonly known as: Cardizem CD Take 1 capsule (120 mg total) by mouth daily.   feeding supplement (PRO-STAT SUGAR FREE 64) Liqd Take 30 mLs by mouth 3 (three) times daily with meals.   fexofenadine 180 MG tablet Commonly known as: ALLEGRA Take 1 tablet (180 mg total) by mouth daily.   finasteride 5 MG tablet Commonly known as: PROSCAR Take 1 tablet (5 mg total) by mouth daily.   HYDROcodone-acetaminophen 5-325 MG tablet Commonly known as: Norco Take 1 tablet by mouth every 6 (six) hours as needed for moderate pain.   melatonin 3 MG Tabs tablet Take 3 mg by mouth at bedtime.   rosuvastatin 5 MG tablet Commonly known as: CRESTOR Take 1 tablet (5 mg total) by mouth daily.   sennosides-docusate sodium 8.6-50 MG tablet Commonly known as: SENOKOT-S Take 2 tablets by mouth daily.   silodosin 8 MG Caps capsule Commonly known as: RAPAFLO Take 1 capsule (8 mg total) by mouth daily with breakfast.   valACYclovir 1000 MG tablet Commonly known as: VALTREX Take 2 tablets (2,000 mg total) by mouth 2 (two) times daily as needed (fever blisters).        Allergies: No Known Allergies  Family History: Family History  Problem Relation Age of Onset   Hypertension Mother    Hypertension Father  Diabetic kidney disease Father     Social History:  reports that he has quit smoking. His smoking use included cigarettes. He has never used smokeless tobacco. He reports that he does not drink alcohol and does not use drugs.  ROS: All other review of systems were reviewed and are negative except what is noted above in HPI  Physical Exam: BP 133/81   Pulse 76   Constitutional:  Alert and oriented, No acute distress. HEENT: [name] AT, moist mucus membranes.  Trachea midline, no masses. Cardiovascular: No clubbing, cyanosis, or edema. Respiratory: Normal respiratory effort, no increased work of breathing. GI: Abdomen is soft, nontender, nondistended, no  abdominal masses GU: No CVA tenderness.  Lymph: No cervical or inguinal lymphadenopathy. Skin: No rashes, bruises or suspicious lesions. Neurologic: Grossly intact, no focal deficits, moving all 4 extremities. Psychiatric: Normal mood and affect.  Laboratory Data: Lab Results  Component Value Date   WBC 9.3 03/22/2022   HGB 11.0 (L) 03/22/2022   HCT 32.4 (L) 03/22/2022   MCV 95.0 03/22/2022   PLT 184 03/22/2022    Lab Results  Component Value Date   CREATININE 0.88 03/22/2022    No results found for: "PSA"  No results found for: "TESTOSTERONE"  No results found for: "HGBA1C"  Urinalysis    Component Value Date/Time   COLORURINE AMBER (A) 03/14/2022 1309   APPEARANCEUR Hazy (A) 04/29/2022 1516   LABSPEC 1.010 03/14/2022 1309   PHURINE 6.0 03/14/2022 1309   GLUCOSEU Negative 04/29/2022 1516   HGBUR LARGE (A) 03/14/2022 1309   BILIRUBINUR Negative 04/29/2022 1516   KETONESUR NEGATIVE 03/14/2022 1309   PROTEINUR Negative 04/29/2022 1516   PROTEINUR 100 (A) 03/14/2022 1309   NITRITE Negative 04/29/2022 1516   NITRITE NEGATIVE 03/14/2022 1309   LEUKOCYTESUR 2+ (A) 04/29/2022 1516   LEUKOCYTESUR LARGE (A) 03/14/2022 1309    Lab Results  Component Value Date   LABMICR See below: 04/29/2022   WBCUA 11-30 (A) 04/29/2022   LABEPIT 0-10 04/29/2022   BACTERIA Few 04/29/2022    Pertinent Imaging:  No results found for this or any previous visit.  No results found for this or any previous visit.  No results found for this or any previous visit.  No results found for this or any previous visit.  Results for orders placed during the hospital encounter of 03/14/22  US Renal  Narrative CLINICAL DATA:  Acute kidney injury.  EXAM: RENAL / URINARY TRACT ULTRASOUND COMPLETE  COMPARISON:  None Available.  FINDINGS: Right Kidney:  Renal measurements: 12.6 x 7.6 x 6.1 cm = volume: 305 mL. Echogenicity within normal limits. No mass or  hydronephrosis visualized.  Left Kidney:  Renal measurements: 13.1 x 7.9 x 6.6 cm = volume: 358 mL. Echogenicity within normal limits. No mass or hydronephrosis visualized.  Bladder:  There appears to be a solid mass in the urinary bladder measuring 6.2 x 5.5 x 2.9 cm.  Other:  None.  IMPRESSION: Kidneys are unremarkable. However, there appears to be a solid mass in the urinary bladder measuring 6.2 x 5.5 x 2.9 cm. Further evaluation with CT scan or cystoscopy is recommended. These results will be called to the ordering clinician or representative by the Radiologist Assistant, and communication documented in the PACS or zVision Dashboard.   Electronically Signed By: Marijo Conception M.D. On: 03/15/2022 13:17  No valid procedures specified. No results found for this or any previous visit.  No results found for this or any previous visit.  Assessment & Plan:    1. Urinary retention We discussed the management of his BPH including continued medical therapy, Rezum, Urolift, TURP and simple prostatectomy. After discussing the options the patient has elected to proceed with Urolift. Risks/benefits/alternatives discussed.    No follow-ups on file.  [name], MD  [facility] [place]

## 2022-05-11 ENCOUNTER — Telehealth: Payer: Self-pay

## 2022-05-11 ENCOUNTER — Other Ambulatory Visit: Payer: Self-pay | Admitting: Adult Health

## 2022-05-11 ENCOUNTER — Other Ambulatory Visit: Payer: Self-pay | Admitting: Physician Assistant

## 2022-05-11 DIAGNOSIS — Z96612 Presence of left artificial shoulder joint: Secondary | ICD-10-CM | POA: Diagnosis not present

## 2022-05-11 DIAGNOSIS — Z9181 History of falling: Secondary | ICD-10-CM | POA: Diagnosis not present

## 2022-05-11 DIAGNOSIS — Z8616 Personal history of COVID-19: Secondary | ICD-10-CM | POA: Diagnosis not present

## 2022-05-11 DIAGNOSIS — Z87440 Personal history of urinary (tract) infections: Secondary | ICD-10-CM | POA: Diagnosis not present

## 2022-05-11 DIAGNOSIS — S42302D Unspecified fracture of shaft of humerus, left arm, subsequent encounter for fracture with routine healing: Secondary | ICD-10-CM | POA: Diagnosis not present

## 2022-05-11 DIAGNOSIS — Z8701 Personal history of pneumonia (recurrent): Secondary | ICD-10-CM | POA: Diagnosis not present

## 2022-05-11 NOTE — Telephone Encounter (Signed)
Patient's sister called with questions regarding up coming surgery on 05/27/22

## 2022-05-13 DIAGNOSIS — S42302D Unspecified fracture of shaft of humerus, left arm, subsequent encounter for fracture with routine healing: Secondary | ICD-10-CM | POA: Diagnosis not present

## 2022-05-13 DIAGNOSIS — Z96612 Presence of left artificial shoulder joint: Secondary | ICD-10-CM | POA: Diagnosis not present

## 2022-05-13 DIAGNOSIS — Z8616 Personal history of COVID-19: Secondary | ICD-10-CM | POA: Diagnosis not present

## 2022-05-13 DIAGNOSIS — Z9181 History of falling: Secondary | ICD-10-CM | POA: Diagnosis not present

## 2022-05-13 DIAGNOSIS — Z87440 Personal history of urinary (tract) infections: Secondary | ICD-10-CM | POA: Diagnosis not present

## 2022-05-13 DIAGNOSIS — Z8701 Personal history of pneumonia (recurrent): Secondary | ICD-10-CM | POA: Diagnosis not present

## 2022-05-14 NOTE — Telephone Encounter (Signed)
Returned call to sister and informed of Urolift procedure patient is to have on 12/14 Sister voiced understanding

## 2022-05-17 ENCOUNTER — Encounter (INDEPENDENT_AMBULATORY_CARE_PROVIDER_SITE_OTHER): Payer: Self-pay | Admitting: *Deleted

## 2022-05-17 DIAGNOSIS — S42292D Other displaced fracture of upper end of left humerus, subsequent encounter for fracture with routine healing: Secondary | ICD-10-CM | POA: Diagnosis not present

## 2022-05-18 DIAGNOSIS — Z96612 Presence of left artificial shoulder joint: Secondary | ICD-10-CM | POA: Diagnosis not present

## 2022-05-18 DIAGNOSIS — Z87440 Personal history of urinary (tract) infections: Secondary | ICD-10-CM | POA: Diagnosis not present

## 2022-05-18 DIAGNOSIS — Z8616 Personal history of COVID-19: Secondary | ICD-10-CM | POA: Diagnosis not present

## 2022-05-18 DIAGNOSIS — Z8701 Personal history of pneumonia (recurrent): Secondary | ICD-10-CM | POA: Diagnosis not present

## 2022-05-18 DIAGNOSIS — Z9181 History of falling: Secondary | ICD-10-CM | POA: Diagnosis not present

## 2022-05-18 DIAGNOSIS — S42302D Unspecified fracture of shaft of humerus, left arm, subsequent encounter for fracture with routine healing: Secondary | ICD-10-CM | POA: Diagnosis not present

## 2022-05-20 DIAGNOSIS — Z8701 Personal history of pneumonia (recurrent): Secondary | ICD-10-CM | POA: Diagnosis not present

## 2022-05-20 DIAGNOSIS — Z87440 Personal history of urinary (tract) infections: Secondary | ICD-10-CM | POA: Diagnosis not present

## 2022-05-20 DIAGNOSIS — Z9181 History of falling: Secondary | ICD-10-CM | POA: Diagnosis not present

## 2022-05-20 DIAGNOSIS — Z8616 Personal history of COVID-19: Secondary | ICD-10-CM | POA: Diagnosis not present

## 2022-05-20 DIAGNOSIS — Z96612 Presence of left artificial shoulder joint: Secondary | ICD-10-CM | POA: Diagnosis not present

## 2022-05-20 DIAGNOSIS — S42302D Unspecified fracture of shaft of humerus, left arm, subsequent encounter for fracture with routine healing: Secondary | ICD-10-CM | POA: Diagnosis not present

## 2022-05-21 ENCOUNTER — Telehealth: Payer: Self-pay

## 2022-05-21 ENCOUNTER — Other Ambulatory Visit: Payer: Self-pay | Admitting: Physician Assistant

## 2022-05-21 NOTE — Telephone Encounter (Signed)
Reinvent to sister that patient need to hold all antihistamines until patient pass his voiding trial. Sister [name] voiced understanding

## 2022-05-21 NOTE — Telephone Encounter (Signed)
Open in error

## 2022-05-24 ENCOUNTER — Encounter (HOSPITAL_COMMUNITY)
Admission: RE | Admit: 2022-05-24 | Discharge: 2022-05-24 | Disposition: A | Discharge status: Home / Self Care | Payer: Medicare HMO | Source: Ambulatory Visit | Attending: Urology | Admitting: Urology

## 2022-05-24 ENCOUNTER — Encounter (HOSPITAL_COMMUNITY): Payer: Self-pay

## 2022-05-24 ENCOUNTER — Other Ambulatory Visit: Payer: Self-pay

## 2022-05-25 DIAGNOSIS — S42302D Unspecified fracture of shaft of humerus, left arm, subsequent encounter for fracture with routine healing: Secondary | ICD-10-CM | POA: Diagnosis not present

## 2022-05-25 DIAGNOSIS — Z96612 Presence of left artificial shoulder joint: Secondary | ICD-10-CM | POA: Diagnosis not present

## 2022-05-25 DIAGNOSIS — Z87440 Personal history of urinary (tract) infections: Secondary | ICD-10-CM | POA: Diagnosis not present

## 2022-05-25 DIAGNOSIS — Z8616 Personal history of COVID-19: Secondary | ICD-10-CM | POA: Diagnosis not present

## 2022-05-25 DIAGNOSIS — Z8701 Personal history of pneumonia (recurrent): Secondary | ICD-10-CM | POA: Diagnosis not present

## 2022-05-25 DIAGNOSIS — Z9181 History of falling: Secondary | ICD-10-CM | POA: Diagnosis not present

## 2022-05-27 ENCOUNTER — Ambulatory Visit (HOSPITAL_BASED_OUTPATIENT_CLINIC_OR_DEPARTMENT_OTHER): Payer: Medicare HMO

## 2022-05-27 ENCOUNTER — Ambulatory Visit (HOSPITAL_COMMUNITY)
Admission: RE | Admit: 2022-05-27 | Discharge: 2022-05-27 | Disposition: A | Discharge status: Home / Self Care | Payer: Medicare HMO | Attending: Urology | Admitting: Urology

## 2022-05-27 ENCOUNTER — Encounter (HOSPITAL_COMMUNITY)
Admission: RE | Disposition: A | Discharge status: Home / Self Care | Payer: Self-pay | Source: Home / Self Care | Attending: Urology

## 2022-05-27 ENCOUNTER — Encounter (HOSPITAL_COMMUNITY): Payer: Self-pay | Admitting: Urology

## 2022-05-27 ENCOUNTER — Ambulatory Visit (HOSPITAL_COMMUNITY): Payer: Medicare HMO

## 2022-05-27 DIAGNOSIS — Z79899 Other long term (current) drug therapy: Secondary | ICD-10-CM | POA: Diagnosis not present

## 2022-05-27 DIAGNOSIS — N401 Enlarged prostate with lower urinary tract symptoms: Secondary | ICD-10-CM | POA: Diagnosis not present

## 2022-05-27 DIAGNOSIS — Z87891 Personal history of nicotine dependence: Secondary | ICD-10-CM

## 2022-05-27 DIAGNOSIS — I10 Essential (primary) hypertension: Secondary | ICD-10-CM | POA: Insufficient documentation

## 2022-05-27 DIAGNOSIS — N138 Other obstructive and reflux uropathy: Secondary | ICD-10-CM | POA: Insufficient documentation

## 2022-05-27 DIAGNOSIS — R338 Other retention of urine: Secondary | ICD-10-CM | POA: Diagnosis not present

## 2022-05-27 DIAGNOSIS — I4891 Unspecified atrial fibrillation: Secondary | ICD-10-CM | POA: Diagnosis not present

## 2022-05-27 DIAGNOSIS — R0602 Shortness of breath: Secondary | ICD-10-CM | POA: Diagnosis not present

## 2022-05-27 HISTORY — PX: CYSTOSCOPY WITH INSERTION OF UROLIFT: SHX6678

## 2022-05-27 SURGERY — CYSTOSCOPY WITH INSERTION OF UROLIFT
Anesthesia: General | Site: Urethra

## 2022-05-27 MED ORDER — FENTANYL CITRATE (PF) 100 MCG/2ML IJ SOLN
INTRAMUSCULAR | Status: AC
  Filled 2022-05-27: qty 2

## 2022-05-27 MED ORDER — FENTANYL CITRATE (PF) 100 MCG/2ML IJ SOLN
INTRAMUSCULAR | Status: DC | PRN
  Administered 2022-05-27 (×2): 50 ug via INTRAVENOUS

## 2022-05-27 MED ORDER — ONDANSETRON HCL 4 MG/2ML IJ SOLN: 4.0000 mg | Freq: Once | INTRAMUSCULAR | Status: DC | PRN

## 2022-05-27 MED ORDER — TRAMADOL HCL 50 MG PO TABS: 50.0000 mg | ORAL_TABLET | Freq: Four times a day (QID) | ORAL | 0 refills | Status: AC | PRN

## 2022-05-27 MED ORDER — CEFAZOLIN SODIUM-DEXTROSE 2-4 GM/100ML-% IV SOLN
2.0000 g | INTRAVENOUS | Status: AC
  Administered 2022-05-27: 2 g via INTRAVENOUS
  Filled 2022-05-27: qty 100

## 2022-05-27 MED ORDER — ONDANSETRON HCL 4 MG/2ML IJ SOLN
INTRAMUSCULAR | Status: AC
  Filled 2022-05-27: qty 2

## 2022-05-27 MED ORDER — PHENYLEPHRINE HCL (PRESSORS) 10 MG/ML IV SOLN
INTRAVENOUS | Status: DC | PRN
  Administered 2022-05-27: 160 ug via INTRAVENOUS

## 2022-05-27 MED ORDER — LIDOCAINE HCL (PF) 2 % IJ SOLN
INTRAMUSCULAR | Status: AC
  Filled 2022-05-27: qty 5

## 2022-05-27 MED ORDER — PROPOFOL 10 MG/ML IV BOLUS
INTRAVENOUS | Status: DC | PRN
  Administered 2022-05-27: 200 mg via INTRAVENOUS

## 2022-05-27 MED ORDER — HYDROMORPHONE HCL 1 MG/ML IJ SOLN: 0.2500 mg | INTRAMUSCULAR | Status: DC | PRN

## 2022-05-27 MED ORDER — LIDOCAINE HCL URETHRAL/MUCOSAL 2 % EX GEL
CUTANEOUS | Status: AC
  Filled 2022-05-27: qty 10

## 2022-05-27 MED ORDER — WATER FOR IRRIGATION, STERILE IR SOLN
Status: DC | PRN
  Administered 2022-05-27: 3000 mL
  Administered 2022-05-27: 500 mL

## 2022-05-27 MED ORDER — LIDOCAINE HCL (CARDIAC) PF 100 MG/5ML IV SOSY
PREFILLED_SYRINGE | INTRAVENOUS | Status: DC | PRN
  Administered 2022-05-27: 100 mg via INTRAVENOUS

## 2022-05-27 MED ORDER — LACTATED RINGERS IV SOLN: INTRAVENOUS | Status: DC | PRN

## 2022-05-27 MED ORDER — DEXAMETHASONE SODIUM PHOSPHATE 10 MG/ML IJ SOLN
INTRAMUSCULAR | Status: AC
  Filled 2022-05-27: qty 1

## 2022-05-27 MED ORDER — PHENYLEPHRINE 80 MCG/ML (10ML) SYRINGE FOR IV PUSH (FOR BLOOD PRESSURE SUPPORT)
PREFILLED_SYRINGE | INTRAVENOUS | Status: AC
  Filled 2022-05-27: qty 10

## 2022-05-27 MED ORDER — ONDANSETRON HCL 4 MG/2ML IJ SOLN
INTRAMUSCULAR | Status: DC | PRN
  Administered 2022-05-27: 4 mg via INTRAVENOUS

## 2022-05-27 MED ORDER — DEXAMETHASONE SODIUM PHOSPHATE 10 MG/ML IJ SOLN
INTRAMUSCULAR | Status: DC | PRN
  Administered 2022-05-27: 10 mg via INTRAVENOUS

## 2022-05-27 SURGICAL SUPPLY — 17 items
BAG DRAIN URO TABLE W/ADPT NS (BAG) ×1 IMPLANT
BAG DRN 8 ADPR NS SKTRN CSTL (BAG) ×1
BAG HAMPER (MISCELLANEOUS) ×1 IMPLANT
CLOTH BEACON ORANGE TIMEOUT ST (SAFETY) ×1 IMPLANT
GLOVE BIO SURGEON STRL SZ8 (GLOVE) ×1 IMPLANT
GLOVE BIOGEL PI IND STRL 7.0 (GLOVE) ×2 IMPLANT
GOWN STRL REUS W/TWL LRG LVL3 (GOWN DISPOSABLE) ×1 IMPLANT
GOWN STRL REUS W/TWL XL LVL3 (GOWN DISPOSABLE) ×1 IMPLANT
KIT TURNOVER CYSTO (KITS) ×1 IMPLANT
MANIFOLD NEPTUNE II (INSTRUMENTS) ×1 IMPLANT
PACK CYSTO (CUSTOM PROCEDURE TRAY) ×1 IMPLANT
PAD ARMBOARD 7.5X6 YLW CONV (MISCELLANEOUS) ×1 IMPLANT
SYSTEM UROLIFT (Male Continence) IMPLANT
TRAY FOLEY W/BAG SLVR 16FR (SET/KITS/TRAYS/PACK) ×1
TRAY FOLEY W/BAG SLVR 16FR ST (SET/KITS/TRAYS/PACK) ×1 IMPLANT
WATER STERILE IRR 3000ML UROMA (IV SOLUTION) ×1 IMPLANT
WATER STERILE IRR 500ML POUR (IV SOLUTION) ×1 IMPLANT

## 2022-05-27 NOTE — Anesthesia Preprocedure Evaluation (Signed)
Anesthesia Evaluation  Patient identified by MRN, date of birth, ID band Patient awake    Reviewed: Allergy & Precautions, H&P , NPO status , Patient's Chart, lab work & pertinent test results  Airway Mallampati: III  TM Distance: >3 FB Neck ROM: Full    Dental  (+) Dental Advisory Given, Implants   Pulmonary shortness of breath and with exertion, pneumonia, former smoker   Pulmonary exam normal breath sounds clear to auscultation       Cardiovascular Exercise Tolerance: Good hypertension, Pt. on medications + dysrhythmias Atrial Fibrillation  Rhythm:Irregular Rate:Normal  14-Mar-2022 15:05:12 [facility]-ER ROUTINE RECORD [id number] (29 yr) Male Caucasian Vent. rate 70 BPM PR interval ms QRS duration 94 ms QT/QTcB 420/454 ms P-R-T axes 67 45 Atrial fibrillation Anteroseptal infarct, age indeterminate Confirmed by Georgina Snell [phone]) on 03/14/2022 3:08:30 PM   Neuro/Psych negative neurological ROS  negative psych ROS   GI/Hepatic negative GI ROS, Neg liver ROS,,,  Endo/Other  negative endocrine ROS    Renal/GU      Musculoskeletal negative musculoskeletal ROS (+)       Abdominal   Peds  Hematology negative hematology ROS (+)   Anesthesia Other Findings Study Conclusions   - Left ventricle: The cavity size was normal. Wall thickness was    normal. Systolic function was normal. The estimated ejection    fraction was in the range of 60% to 65%. Wall motion was normal;    there were no regional wall motion abnormalities. The study was    not technically sufficient to allow evaluation of LV diastolic    dysfunction due to atrial fibrillation.  - Aorta: Mild aortic root dilatation.  - Left atrium: The atrium was moderately to severely dilated.  - Tricuspid valve: There was mild regurgitation.    Reproductive/Obstetrics negative OB ROS                              Anesthesia Physical Anesthesia Plan  ASA: 3  Anesthesia Plan: General   Post-op Pain Management: Minimal or no pain anticipated   Induction: Intravenous  PONV Risk Score and Plan: 1 and TIVA and Propofol infusion  Airway Management Planned: Nasal Cannula and Natural Airway  Additional Equipment:   Intra-op Plan:   Post-operative Plan:   Informed Consent: I have reviewed the patients History and Physical, chart, labs and discussed the procedure including the risks, benefits and alternatives for the proposed anesthesia with the patient or authorized representative who has indicated his/her understanding and acceptance.       Plan Discussed with: CRNA  Anesthesia Plan Comments:         Anesthesia Quick Evaluation

## 2022-05-27 NOTE — Anesthesia Procedure Notes (Signed)
Procedure Name: LMA Insertion Date/Time: 05/27/2022 10:28 AM  Performed by: Trixie Rude, MDPre-anesthesia Checklist: Patient identified, Emergency Drugs available, Suction available and Patient being monitored Patient Re-evaluated:Patient Re-evaluated prior to induction Oxygen Delivery Method: Circle system utilized Preoxygenation: Pre-oxygenation with 100% oxygen Induction Type: IV induction Ventilation: Mask ventilation without difficulty LMA: LMA inserted LMA Size: 5.0 Tube type: Oral Number of attempts: 1 Airway Equipment and Method: Oral airway Placement Confirmation: positive ETCO2 and breath sounds checked- equal and bilateral Tube secured with: Tape Dental Injury: Teeth and Oropharynx as per pre-operative assessment

## 2022-05-27 NOTE — Interval H&P Note (Signed)
History and Physical Interval Note:  05/27/2022 10:07 AM  Geoffrey Patterson  has presented today for surgery, with the diagnosis of BPH with urinary retention.  The various methods of treatment have been discussed with the patient and family. After consideration of risks, benefits and other options for treatment, the patient has consented to  Procedure(s): CYSTOSCOPY WITH INSERTION OF UROLIFT (N/A) as a surgical intervention.  The patient's history has been reviewed, patient examined, no change in status, stable for surgery.  I have reviewed the patient's chart and labs.  Questions were answered to the patient's satisfaction.     [name]

## 2022-05-27 NOTE — Op Note (Signed)
   PREOPERATIVE DIAGNOSIS:  Benign prostatic hypertrophy with bladder outlet obstruction.  POSTOPERATIVE DIAGNOSIS:  Benign prostatic hypertrophy with bladder outlet obstruction.  PROCEDURE:  Cystoscopy with implantation of UroLift devices, 6 implants.  SURGEON:  [name], M.D.  ANESTHESIA:  General  ANTIBIOTICS: ancef  SPECIMEN:  None.  DRAINS:  A 16-French Foley catheter.  BLOOD LOSS:  Minimal.  COMPLICATIONS:  None.  INDICATIONS: The Patient is an 71 year old white male with BPH and bladder outlet obstruction and urinary retention.  He has failed medical therapy and has elected UroLift for definitive treatment.  FINDINGS OF PROCEDURE:  He was taken to the operating room where a genral anesthetic was induced.  He was placed in lithotomy position and was fitted with PAS hose.  His perineum and genitalia were prepped with chlorhexidine, and he was draped in usual sterile fashion.  Cystoscopy was performed using the UroLift scope and 0 degree lens. Examination revealed a normal urethra.  The external sphincter was intact.  Prostatic urethra was approximately 4 cm in length with lateral lobe enlargement. There was also little bit of bladder neck elevation. Inspection of bladder revealed mild-to-moderate trabeculation with no tumors, stones, or inflammation.  No cellules or diverticula were noted. Ureteral orifices were in their normal anatomic position effluxing clear urine.  After initial cystoscopy, the visual obturator was replaced with the first UroLift device.  This was turned to the 9 o'clock position and pulled back to the veru and then slightly advanced.  Pressure was then applied to the right lateral lobe and the UroLift device was deployed.  The second UroLift device was then inserted and applied to the left lateral lobe at 3 o'clock and deployed in the mid prostatic urethra. After this, there was still some apparent obstruction closer to the bladder  neck.  So a second and third level of UroLift device was applied between the mid urethra and the proximal urethra providing further patency to the prostatic urethra.   The scope was removed and a 16-French Foley catheter was inserted without difficulty. The balloon was filled with 10 mL sterile fluid, and the catheter was placed to straight drainage.  COMPLICATIONS: None   CONDITION: Stable, extubated, transferred to PACU  PLAN: The patient will be discharged home and followup in 2 days for a voiding trial.

## 2022-05-27 NOTE — Transfer of Care (Signed)
Immediate Anesthesia Transfer of Care Note  Patient: Geoffrey Patterson  Procedure(s) Performed: CYSTOSCOPY WITH INSERTION OF UROLIFT (Urethra)  Patient Location: PACU  Anesthesia Type:General  Level of Consciousness: awake, alert , oriented, and patient cooperative  Airway & Oxygen Therapy: Patient Spontanous Breathing and Patient connected to nasal cannula oxygen  Post-op Assessment: Report given to RN, Post -op Vital signs reviewed and stable, and Patient moving all extremities  Post vital signs: Reviewed and stable  Last Vitals:  Vitals Value Taken Time  BP 111/67 05/27/22 1100  Temp    Pulse 74 05/27/22 1101  Resp 17 05/27/22 1101  SpO2 99 % 05/27/22 1101  Vitals shown include unvalidated device data.  Last Pain:  Vitals:   05/27/22 1000  TempSrc: Oral  PainSc:          Complications: No notable events documented.

## 2022-05-27 NOTE — Anesthesia Postprocedure Evaluation (Signed)
Anesthesia Post Note  Patient: Geoffrey Patterson  Procedure(s) Performed: CYSTOSCOPY WITH INSERTION OF UROLIFT (Urethra)  Patient location during evaluation: PACU Anesthesia Type: General Level of consciousness: awake and alert Pain management: pain level controlled Vital Signs Assessment: post-procedure vital signs reviewed and stable Respiratory status: spontaneous breathing, nonlabored ventilation, respiratory function stable and patient connected to nasal cannula oxygen Cardiovascular status: blood pressure returned to baseline and stable Postop Assessment: no apparent nausea or vomiting Anesthetic complications: no   There were no known notable events for this encounter.   Last Vitals:  Vitals:   05/27/22 1145 05/27/22 1154  BP: 135/80 (!) 147/94  Pulse: 72 78  Resp: 17 18  Temp:    SpO2: 98% 100%    Last Pain:  Vitals:   05/27/22 1154  TempSrc:   PainSc: 0-No pain                 Trixie Rude

## 2022-05-28 DIAGNOSIS — Z96612 Presence of left artificial shoulder joint: Secondary | ICD-10-CM | POA: Diagnosis not present

## 2022-05-28 DIAGNOSIS — Z8616 Personal history of COVID-19: Secondary | ICD-10-CM | POA: Diagnosis not present

## 2022-05-28 DIAGNOSIS — Z8701 Personal history of pneumonia (recurrent): Secondary | ICD-10-CM | POA: Diagnosis not present

## 2022-05-28 DIAGNOSIS — Z87440 Personal history of urinary (tract) infections: Secondary | ICD-10-CM | POA: Diagnosis not present

## 2022-05-28 DIAGNOSIS — S42302D Unspecified fracture of shaft of humerus, left arm, subsequent encounter for fracture with routine healing: Secondary | ICD-10-CM | POA: Diagnosis not present

## 2022-05-28 DIAGNOSIS — Z9181 History of falling: Secondary | ICD-10-CM | POA: Diagnosis not present

## 2022-05-31 ENCOUNTER — Ambulatory Visit (INDEPENDENT_AMBULATORY_CARE_PROVIDER_SITE_OTHER): Payer: Medicare HMO | Admitting: Urology

## 2022-05-31 DIAGNOSIS — N3289 Other specified disorders of bladder: Secondary | ICD-10-CM | POA: Diagnosis not present

## 2022-05-31 NOTE — Progress Notes (Addendum)
Fill and Pull Catheter Removal  Patient is present today for a catheter removal.  Patient was cleaned and prepped in a sterile fashion 110m of sterile water/ saline was instilled into the bladder when the patient felt the urge to urinate. 137mof water was then drained from the balloon.  A 16 FR foley cath was removed from the bladder no complications were noted .  Patient as then given some time to void on their own.  Patient can void  100 ml on their own after some time.  Patient tolerated well.  Performed by: ShMarisue BrooklynMA  Follow up/ Additional notes: Keep office visit  Pt here today for bladder scan. Bladder was scanned and 380 was visualized.   Performed by ShMarisue BrooklynMA  Additional follow up Follow up as scheduled    Simple Catheter Placement  Due to urinary retention patient is present today for a foley cath placement.  Patient was cleaned and prepped in a sterile fashion with betadine . A 16 FR foley catheter was inserted, urine return was noted  425 ml, urine was dark brown in color.  The balloon was filled with 10cc of sterile water.  A overnight bag was attached for drainage. Patient was also given a night bag to take home and was given instruction on how to change from one bag to another.  Patient was given instruction on proper catheter care.  Patient tolerated well, no complications were noted   Performed by: [email or web address]  Additional notes/ Follow up: Keep scheduled post-op 2 day to discussed foley removal with Dr. McAlyson Ingles

## 2022-05-31 NOTE — Addendum Note (Signed)
Addended by: Darcella Gasman R on: 05/31/2022 03:20 PM   Modules accepted: Orders

## 2022-06-01 ENCOUNTER — Encounter (HOSPITAL_COMMUNITY): Payer: Self-pay | Admitting: Urology

## 2022-06-01 DIAGNOSIS — Z8616 Personal history of COVID-19: Secondary | ICD-10-CM | POA: Diagnosis not present

## 2022-06-01 DIAGNOSIS — Z9181 History of falling: Secondary | ICD-10-CM | POA: Diagnosis not present

## 2022-06-01 DIAGNOSIS — Z96612 Presence of left artificial shoulder joint: Secondary | ICD-10-CM | POA: Diagnosis not present

## 2022-06-01 DIAGNOSIS — S42302D Unspecified fracture of shaft of humerus, left arm, subsequent encounter for fracture with routine healing: Secondary | ICD-10-CM | POA: Diagnosis not present

## 2022-06-01 DIAGNOSIS — Z87440 Personal history of urinary (tract) infections: Secondary | ICD-10-CM | POA: Diagnosis not present

## 2022-06-01 DIAGNOSIS — Z8701 Personal history of pneumonia (recurrent): Secondary | ICD-10-CM | POA: Diagnosis not present

## 2022-06-02 ENCOUNTER — Other Ambulatory Visit: Payer: Self-pay | Admitting: Urology

## 2022-06-02 ENCOUNTER — Ambulatory Visit (INDEPENDENT_AMBULATORY_CARE_PROVIDER_SITE_OTHER): Payer: Medicare HMO | Admitting: Urology

## 2022-06-02 VITALS — BP 154/88 | HR 81

## 2022-06-02 DIAGNOSIS — R339 Retention of urine, unspecified: Secondary | ICD-10-CM | POA: Diagnosis not present

## 2022-06-02 DIAGNOSIS — N3289 Other specified disorders of bladder: Secondary | ICD-10-CM

## 2022-06-02 LAB — BLADDER VOIDING TRIAL: Scan Result: 463

## 2022-06-02 MED ORDER — SILODOSIN 8 MG PO CAPS: 8.0000 mg | ORAL_CAPSULE | Freq: Two times a day (BID) | ORAL | 11 refills | Status: DC

## 2022-06-02 NOTE — Progress Notes (Signed)
Fill and Pull Catheter Removal  Patient is present today for a catheter removal.  Patient was cleaned and prepped in a sterile fashion attempt to instilled  sterile water/ saline into the bladder but patient had multiple bladder spasm pushing the sterile water out.6 ml of water was then drained from the balloon.  A 16 FR foley cath was removed from the bladder no complications were noted .  Patient had multiple bladder spasm and was unable to void.  Patient was made aware to drink plenty of fluid and return to the office at 3 pm for PVR.  Catheter Removal  Patient is present today for a catheter removal.  65m of water was drained from the balloon. A 16FR foley cath was removed from the bladder, no complications were noted. Patient tolerated well.  Performed by: [id number]  Follow up/ Additional notes: Review with Dr. MAlyson Inglesand patient will return at 3 pm for PVR  Pt here today for bladder scan. Bladder was scanned and 463 was visualized.    Performed by SMarisue BrooklynCMA  Additional follow up Follow up as scheduled   Simple Catheter Placement  Due to urinary retention patient is present today for a foley cath placement.  Patient was cleaned and prepped in a sterile fashion with betadine and 2% lidocaine jelly was instilled into the urethra. A 16 FR foley catheter was inserted, urine return was noted  500 ml, urine was dark yellow in color.  The balloon was filled with 10cc of sterile water.  A night  bag was attached for drainage. Patient was also given a night bag to take home and was given instruction on how to change from one bag to another.  Patient was given instruction on proper catheter care.  Patient tolerated well, no complications were noted   Performed by: [id number]  Additional notes/ Follow up: Follow up as scheduled

## 2022-06-03 ENCOUNTER — Encounter: Payer: Self-pay | Admitting: Urology

## 2022-06-03 DIAGNOSIS — Z8616 Personal history of COVID-19: Secondary | ICD-10-CM | POA: Diagnosis not present

## 2022-06-03 DIAGNOSIS — Z87440 Personal history of urinary (tract) infections: Secondary | ICD-10-CM | POA: Diagnosis not present

## 2022-06-03 DIAGNOSIS — Z9181 History of falling: Secondary | ICD-10-CM | POA: Diagnosis not present

## 2022-06-03 DIAGNOSIS — Z8701 Personal history of pneumonia (recurrent): Secondary | ICD-10-CM | POA: Diagnosis not present

## 2022-06-03 DIAGNOSIS — Z96612 Presence of left artificial shoulder joint: Secondary | ICD-10-CM | POA: Diagnosis not present

## 2022-06-03 DIAGNOSIS — S42302D Unspecified fracture of shaft of humerus, left arm, subsequent encounter for fracture with routine healing: Secondary | ICD-10-CM | POA: Diagnosis not present

## 2022-06-04 ENCOUNTER — Telehealth: Payer: Self-pay

## 2022-06-04 NOTE — Telephone Encounter (Signed)
Patient's sister call about medication clarification .Per Dr. Alyson Ingles patient is to stop tamsulosin and start rapflo. Sister voiced understanding

## 2022-06-08 DIAGNOSIS — Z8616 Personal history of COVID-19: Secondary | ICD-10-CM | POA: Diagnosis not present

## 2022-06-08 DIAGNOSIS — Z87440 Personal history of urinary (tract) infections: Secondary | ICD-10-CM | POA: Diagnosis not present

## 2022-06-08 DIAGNOSIS — Z9181 History of falling: Secondary | ICD-10-CM | POA: Diagnosis not present

## 2022-06-08 DIAGNOSIS — S42302D Unspecified fracture of shaft of humerus, left arm, subsequent encounter for fracture with routine healing: Secondary | ICD-10-CM | POA: Diagnosis not present

## 2022-06-08 DIAGNOSIS — Z8701 Personal history of pneumonia (recurrent): Secondary | ICD-10-CM | POA: Diagnosis not present

## 2022-06-08 DIAGNOSIS — Z96612 Presence of left artificial shoulder joint: Secondary | ICD-10-CM | POA: Diagnosis not present

## 2022-06-10 ENCOUNTER — Telehealth: Payer: Self-pay

## 2022-06-10 DIAGNOSIS — S42302D Unspecified fracture of shaft of humerus, left arm, subsequent encounter for fracture with routine healing: Secondary | ICD-10-CM | POA: Diagnosis not present

## 2022-06-10 DIAGNOSIS — Z9181 History of falling: Secondary | ICD-10-CM | POA: Diagnosis not present

## 2022-06-10 DIAGNOSIS — Z87440 Personal history of urinary (tract) infections: Secondary | ICD-10-CM | POA: Diagnosis not present

## 2022-06-10 DIAGNOSIS — Z96612 Presence of left artificial shoulder joint: Secondary | ICD-10-CM | POA: Diagnosis not present

## 2022-06-10 DIAGNOSIS — Z8701 Personal history of pneumonia (recurrent): Secondary | ICD-10-CM | POA: Diagnosis not present

## 2022-06-10 DIAGNOSIS — Z8616 Personal history of COVID-19: Secondary | ICD-10-CM | POA: Diagnosis not present

## 2022-06-10 NOTE — Telephone Encounter (Signed)
Pt needing  a call back regarding medication he hasn't been able to start due to insurance not approving.  Pt has an appointment on Jan 3.  May need to cancel appt.  Please advise.  Requests a call to [facility] - [phone].  Thanks, Helene Kelp

## 2022-06-10 NOTE — Telephone Encounter (Signed)
PA done for Silodosin and waiting on response. Emergency contact called and made aware. Pending approval. Patient will stop Tamsulosin and begin Silodosin bid.

## 2022-06-10 NOTE — Telephone Encounter (Signed)
Received an approval on PA. [facility] called and made aware. Sister called and made aware.

## 2022-06-10 NOTE — Telephone Encounter (Signed)
Can you please clarify if patient is supposed to be taking rapaflo or tamsulosin or both.  Rapaflo rx was canceled with pharmacy, patient is still taking flomax.  He is confused on rx instructions.

## 2022-06-10 NOTE — Telephone Encounter (Signed)
Please see 12/28 telephone encounter.

## 2022-06-15 DIAGNOSIS — S42302D Unspecified fracture of shaft of humerus, left arm, subsequent encounter for fracture with routine healing: Secondary | ICD-10-CM | POA: Diagnosis not present

## 2022-06-15 DIAGNOSIS — Z9181 History of falling: Secondary | ICD-10-CM | POA: Diagnosis not present

## 2022-06-15 DIAGNOSIS — Z87440 Personal history of urinary (tract) infections: Secondary | ICD-10-CM | POA: Diagnosis not present

## 2022-06-15 DIAGNOSIS — Z96612 Presence of left artificial shoulder joint: Secondary | ICD-10-CM | POA: Diagnosis not present

## 2022-06-15 DIAGNOSIS — Z8616 Personal history of COVID-19: Secondary | ICD-10-CM | POA: Diagnosis not present

## 2022-06-15 DIAGNOSIS — Z8701 Personal history of pneumonia (recurrent): Secondary | ICD-10-CM | POA: Diagnosis not present

## 2022-06-16 ENCOUNTER — Ambulatory Visit: Payer: Medicare HMO | Admitting: Urology

## 2022-06-18 DIAGNOSIS — Z9181 History of falling: Secondary | ICD-10-CM | POA: Diagnosis not present

## 2022-06-18 DIAGNOSIS — S42302D Unspecified fracture of shaft of humerus, left arm, subsequent encounter for fracture with routine healing: Secondary | ICD-10-CM | POA: Diagnosis not present

## 2022-06-18 DIAGNOSIS — Z8616 Personal history of COVID-19: Secondary | ICD-10-CM | POA: Diagnosis not present

## 2022-06-18 DIAGNOSIS — Z96612 Presence of left artificial shoulder joint: Secondary | ICD-10-CM | POA: Diagnosis not present

## 2022-06-18 DIAGNOSIS — Z87440 Personal history of urinary (tract) infections: Secondary | ICD-10-CM | POA: Diagnosis not present

## 2022-06-18 DIAGNOSIS — Z8701 Personal history of pneumonia (recurrent): Secondary | ICD-10-CM | POA: Diagnosis not present

## 2022-06-25 ENCOUNTER — Other Ambulatory Visit: Payer: Self-pay | Admitting: Adult Health

## 2022-06-29 DIAGNOSIS — M25512 Pain in left shoulder: Secondary | ICD-10-CM | POA: Diagnosis not present

## 2022-06-29 DIAGNOSIS — M25612 Stiffness of left shoulder, not elsewhere classified: Secondary | ICD-10-CM | POA: Diagnosis not present

## 2022-06-30 ENCOUNTER — Ambulatory Visit (INDEPENDENT_AMBULATORY_CARE_PROVIDER_SITE_OTHER): Payer: Medicare HMO | Admitting: Urology

## 2022-06-30 ENCOUNTER — Other Ambulatory Visit: Payer: Self-pay | Admitting: Adult Health

## 2022-06-30 VITALS — BP 154/84 | HR 106 | Ht 73.0 in | Wt 235.0 lb

## 2022-06-30 DIAGNOSIS — N138 Other obstructive and reflux uropathy: Secondary | ICD-10-CM | POA: Diagnosis not present

## 2022-06-30 DIAGNOSIS — N401 Enlarged prostate with lower urinary tract symptoms: Secondary | ICD-10-CM | POA: Diagnosis not present

## 2022-06-30 DIAGNOSIS — R339 Retention of urine, unspecified: Secondary | ICD-10-CM | POA: Diagnosis not present

## 2022-06-30 NOTE — Progress Notes (Signed)
Fill and Pull Catheter Removal  Patient is present today for a catheter removal.  Patient was cleaned and prepped in a sterile fashion 141m of sterile water/ saline was instilled into the bladder when the patient felt the urge to urinate. 167mof water was then drained from the balloon.  A 16FR foley cath was removed from the bladder no complications were noted .  Patient as then given some time to void on their own.  Patient can void  5036mn their own after some time.  Patient tolerated well.  Performed by: KouLevi AlandMA  Follow up/ Additional notes: MD to see after.   post void residual =136m35m

## 2022-07-01 ENCOUNTER — Ambulatory Visit (INDEPENDENT_AMBULATORY_CARE_PROVIDER_SITE_OTHER): Payer: Medicare HMO | Admitting: Urology

## 2022-07-01 DIAGNOSIS — R339 Retention of urine, unspecified: Secondary | ICD-10-CM

## 2022-07-01 NOTE — Progress Notes (Signed)
post void residual =663m  Simple Catheter Placement  Due to urinary retention patient is present today for a foley cath placement.  Patient was cleaned and prepped in a sterile fashion with betadine and 2% lidocaine jelly was instilled into the urethra. A 16 FR foley catheter was inserted, urine return was noted  7051m urine was yellow in color.  The balloon was filled with 10cc of sterile water.  A bed bag was attached for drainage.  Patient was given instruction on proper catheter care.  Patient tolerated well, no complications were noted   Performed by: KoLevi AlandCMA  Additional notes/ Follow up: Follow up with MD for a Cysto

## 2022-07-05 DIAGNOSIS — S42292D Other displaced fracture of upper end of left humerus, subsequent encounter for fracture with routine healing: Secondary | ICD-10-CM | POA: Diagnosis not present

## 2022-07-06 ENCOUNTER — Ambulatory Visit (INDEPENDENT_AMBULATORY_CARE_PROVIDER_SITE_OTHER): Payer: Medicare HMO | Admitting: Urology

## 2022-07-06 ENCOUNTER — Other Ambulatory Visit: Payer: Medicare HMO | Admitting: Urology

## 2022-07-06 DIAGNOSIS — R339 Retention of urine, unspecified: Secondary | ICD-10-CM

## 2022-07-06 DIAGNOSIS — N400 Enlarged prostate without lower urinary tract symptoms: Secondary | ICD-10-CM

## 2022-07-06 MED ORDER — CIPROFLOXACIN HCL 500 MG PO TABS
500.0000 mg | ORAL_TABLET | Freq: Once | ORAL | Status: AC
  Administered 2022-07-06: 500 mg via ORAL

## 2022-07-06 NOTE — Progress Notes (Signed)
Simple Catheter Placement  Due to urinary retention patient is present today for a foley cath placement.  Patient was cleaned and prepped in a sterile fashion with betadine and 2% lidocaine jelly was instilled into the urethra. A 16 FR foley catheter was inserted, urine return was noted  154m, urine was clear in color.  The balloon was filled with 10cc of sterile water.  A bedside bag was attached for drainage.   Patient was given instruction on proper catheter care.  Patient tolerated well, no complications were noted   Performed by: Tawnia Schirm LPN  Additional notes/ Follow up: Per MD note

## 2022-07-08 DIAGNOSIS — M25612 Stiffness of left shoulder, not elsewhere classified: Secondary | ICD-10-CM | POA: Diagnosis not present

## 2022-07-08 DIAGNOSIS — M25512 Pain in left shoulder: Secondary | ICD-10-CM | POA: Diagnosis not present

## 2022-07-09 ENCOUNTER — Other Ambulatory Visit: Payer: Medicare HMO | Admitting: Urology

## 2022-07-12 ENCOUNTER — Encounter: Payer: Self-pay | Admitting: Urology

## 2022-07-12 NOTE — H&P (View-Only) (Signed)
     CC: urinary retention   HPI: Geoffrey Patterson is a 72yo here for cystoscopy for urinary retention. There were no vitals taken for this visit. NED. A&Ox3.   No respiratory distress   Abd soft, NT, ND Normal phallus with bilateral descended testicles  Cystoscopy Procedure Note  Patient identification was confirmed, informed consent was obtained, and patient was prepped using Betadine solution.  Lidocaine jelly was administered per urethral meatus.     Pre-Procedure: - Inspection reveals a normal caliber ureteral meatus.  Procedure: The flexible cystoscope was introduced without difficulty - No urethral strictures/lesions are present. - Enlarged prostate bilobar obstruction - Elevated bladder neck - Bilateral ureteral orifices identified - Bladder mucosa  reveals no ulcers, tumors, or lesions - No bladder stones - No trabeculation    Post-Procedure: - Patient tolerated the procedure well  Assessment/ Plan: We discussed the management of his BPH including continued medical therapy, Rezum, Urolift, TURP and simple prostatectomy. After discussing the options the patient has elected to proceed with TURP. Risks/benefits/alternatives discussed.   Geoffrey Bang, MD

## 2022-07-12 NOTE — Patient Instructions (Signed)

## 2022-07-12 NOTE — Progress Notes (Signed)
     CC: urinary retention   HPI: Mr Barga is a 72yo here for cystoscopy for urinary retention. There were no vitals taken for this visit. NED. A&Ox3.   No respiratory distress   Abd soft, NT, ND Normal phallus with bilateral descended testicles  Cystoscopy Procedure Note  Patient identification was confirmed, informed consent was obtained, and patient was prepped using Betadine solution.  Lidocaine jelly was administered per urethral meatus.     Pre-Procedure: - Inspection reveals a normal caliber ureteral meatus.  Procedure: The flexible cystoscope was introduced without difficulty - No urethral strictures/lesions are present. - Enlarged prostate bilobar obstruction - Elevated bladder neck - Bilateral ureteral orifices identified - Bladder mucosa  reveals no ulcers, tumors, or lesions - No bladder stones - No trabeculation    Post-Procedure: - Patient tolerated the procedure well  Assessment/ Plan: We discussed the management of his BPH including continued medical therapy, Rezum, Urolift, TURP and simple prostatectomy. After discussing the options the patient has elected to proceed with TURP. Risks/benefits/alternatives discussed.   [name], MD

## 2022-07-13 ENCOUNTER — Other Ambulatory Visit: Payer: Self-pay

## 2022-07-13 ENCOUNTER — Encounter (HOSPITAL_COMMUNITY): Payer: Self-pay

## 2022-07-13 ENCOUNTER — Encounter (HOSPITAL_COMMUNITY)
Admission: RE | Admit: 2022-07-13 | Discharge: 2022-07-13 | Disposition: A | Discharge status: Home / Self Care | Payer: Medicare HMO | Source: Ambulatory Visit | Attending: Urology | Admitting: Urology

## 2022-07-13 NOTE — Pre-Procedure Instructions (Signed)
Left VM for sister/caregiver, Erling Conte to call us back.

## 2022-07-14 ENCOUNTER — Other Ambulatory Visit: Payer: Self-pay | Admitting: Adult Health

## 2022-07-15 ENCOUNTER — Ambulatory Visit (HOSPITAL_BASED_OUTPATIENT_CLINIC_OR_DEPARTMENT_OTHER): Payer: Medicare HMO | Admitting: Anesthesiology

## 2022-07-15 ENCOUNTER — Observation Stay (HOSPITAL_COMMUNITY)
Admission: RE | Admit: 2022-07-15 | Discharge: 2022-07-16 | Disposition: A | Discharge status: Home / Self Care | Payer: Medicare HMO | Source: Ambulatory Visit | Attending: Urology | Admitting: Urology

## 2022-07-15 ENCOUNTER — Encounter (HOSPITAL_COMMUNITY): Payer: Self-pay | Admitting: Urology

## 2022-07-15 ENCOUNTER — Encounter (HOSPITAL_COMMUNITY)
Admission: RE | Disposition: A | Discharge status: Home / Self Care | Payer: Self-pay | Source: Ambulatory Visit | Attending: Urology

## 2022-07-15 ENCOUNTER — Ambulatory Visit (HOSPITAL_COMMUNITY): Payer: Medicare HMO | Admitting: Anesthesiology

## 2022-07-15 DIAGNOSIS — N400 Enlarged prostate without lower urinary tract symptoms: Principal | ICD-10-CM | POA: Insufficient documentation

## 2022-07-15 DIAGNOSIS — Z87891 Personal history of nicotine dependence: Secondary | ICD-10-CM | POA: Diagnosis not present

## 2022-07-15 DIAGNOSIS — I10 Essential (primary) hypertension: Secondary | ICD-10-CM | POA: Diagnosis not present

## 2022-07-15 DIAGNOSIS — R339 Retention of urine, unspecified: Secondary | ICD-10-CM | POA: Diagnosis not present

## 2022-07-15 DIAGNOSIS — I4891 Unspecified atrial fibrillation: Secondary | ICD-10-CM | POA: Diagnosis not present

## 2022-07-15 DIAGNOSIS — N401 Enlarged prostate with lower urinary tract symptoms: Secondary | ICD-10-CM | POA: Diagnosis not present

## 2022-07-15 DIAGNOSIS — R338 Other retention of urine: Secondary | ICD-10-CM | POA: Diagnosis not present

## 2022-07-15 DIAGNOSIS — N138 Other obstructive and reflux uropathy: Secondary | ICD-10-CM

## 2022-07-15 HISTORY — PX: TRANSURETHRAL RESECTION OF PROSTATE: SHX73

## 2022-07-15 HISTORY — PX: CYSTOSCOPY: SHX5120

## 2022-07-15 LAB — BASIC METABOLIC PANEL
Anion gap: 11 (ref 5–15)
BUN: 14 mg/dL (ref 8–23)
CO2: 20 mmol/L — ABNORMAL LOW (ref 22–32)
Calcium: 8.1 mg/dL — ABNORMAL LOW (ref 8.9–10.3)
Chloride: 106 mmol/L (ref 98–111)
Creatinine, Ser: 0.99 mg/dL (ref 0.61–1.24)
GFR, Estimated: >60 mL/min (ref >60)
Glucose, Bld: 104 mg/dL — ABNORMAL HIGH (ref 70–99)
Potassium: 4.2 mmol/L (ref 3.5–5.1)
Sodium: 137 mmol/L (ref 135–145)

## 2022-07-15 LAB — CBC
HCT: 38.4 % — ABNORMAL LOW (ref 39.0–52.0)
Hemoglobin: 12.4 g/dL — ABNORMAL LOW (ref 13.0–17.0)
MCH: 28.6 pg (ref 26.0–34.0)
MCHC: 32.3 g/dL (ref 30.0–36.0)
MCV: 88.7 fL (ref 80.0–100.0)
Platelets: 200 10*3/uL (ref 150–400)
RBC: 4.33 MIL/uL (ref 4.22–5.81)
RDW: 14.6 % (ref 11.5–15.5)
WBC: 7.3 10*3/uL (ref 4.0–10.5)
nRBC: 0 % (ref 0.0–0.2)

## 2022-07-15 SURGERY — TURP (TRANSURETHRAL RESECTION OF PROSTATE)
Anesthesia: General | Site: Prostate

## 2022-07-15 MED ORDER — PHENYLEPHRINE 80 MCG/ML (10ML) SYRINGE FOR IV PUSH (FOR BLOOD PRESSURE SUPPORT)
PREFILLED_SYRINGE | INTRAVENOUS | Status: AC
  Filled 2022-07-15: qty 20

## 2022-07-15 MED ORDER — ROCURONIUM BROMIDE 10 MG/ML (PF) SYRINGE
PREFILLED_SYRINGE | INTRAVENOUS | Status: DC | PRN
  Administered 2022-07-15: 50 mg via INTRAVENOUS
  Administered 2022-07-15: 20 mg via INTRAVENOUS

## 2022-07-15 MED ORDER — SODIUM CHLORIDE 0.9 % IV SOLN
2.0000 g | INTRAVENOUS | Status: AC
  Administered 2022-07-15: 2 g via INTRAVENOUS
  Filled 2022-07-15: qty 20

## 2022-07-15 MED ORDER — SODIUM CHLORIDE 0.9 % IV SOLN: INTRAVENOUS | Status: DC

## 2022-07-15 MED ORDER — LIDOCAINE HCL (CARDIAC) PF 100 MG/5ML IV SOSY
PREFILLED_SYRINGE | INTRAVENOUS | Status: DC | PRN
  Administered 2022-07-15: 60 mg via INTRATRACHEAL

## 2022-07-15 MED ORDER — HYDROMORPHONE HCL 1 MG/ML IJ SOLN: 0.2500 mg | INTRAMUSCULAR | Status: DC | PRN

## 2022-07-15 MED ORDER — ROSUVASTATIN CALCIUM 5 MG PO TABS
5.0000 mg | ORAL_TABLET | Freq: Every day | ORAL | Status: DC
  Administered 2022-07-15: 5 mg via ORAL
  Filled 2022-07-15: qty 1

## 2022-07-15 MED ORDER — CHLORHEXIDINE GLUCONATE 0.12 % MT SOLN
15.0000 mL | Freq: Once | OROMUCOSAL | Status: AC
  Administered 2022-07-15: 15 mL via OROMUCOSAL

## 2022-07-15 MED ORDER — EPHEDRINE SULFATE (PRESSORS) 50 MG/ML IJ SOLN
INTRAMUSCULAR | Status: DC | PRN
  Administered 2022-07-15 (×4): 5 mg via INTRAVENOUS

## 2022-07-15 MED ORDER — LACTATED RINGERS IV SOLN: INTRAVENOUS | Status: DC

## 2022-07-15 MED ORDER — ONDANSETRON HCL 4 MG/2ML IJ SOLN
INTRAMUSCULAR | Status: DC | PRN
  Administered 2022-07-15: 4 mg via INTRAVENOUS

## 2022-07-15 MED ORDER — ONDANSETRON HCL 4 MG/2ML IJ SOLN: 4.0000 mg | Freq: Once | INTRAMUSCULAR | Status: DC | PRN

## 2022-07-15 MED ORDER — WATER FOR IRRIGATION, STERILE IR SOLN
Status: DC | PRN
  Administered 2022-07-15: 500 mL

## 2022-07-15 MED ORDER — PROPOFOL 10 MG/ML IV BOLUS
INTRAVENOUS | Status: AC
  Filled 2022-07-15: qty 20

## 2022-07-15 MED ORDER — FINASTERIDE 5 MG PO TABS
5.0000 mg | ORAL_TABLET | Freq: Every day | ORAL | Status: DC
  Administered 2022-07-15: 5 mg via ORAL
  Filled 2022-07-15: qty 1

## 2022-07-15 MED ORDER — ONDANSETRON HCL 4 MG/2ML IJ SOLN
INTRAMUSCULAR | Status: AC
  Filled 2022-07-15: qty 2

## 2022-07-15 MED ORDER — ORAL CARE MOUTH RINSE: 15.0000 mL | Freq: Once | OROMUCOSAL | Status: AC

## 2022-07-15 MED ORDER — ACETAMINOPHEN 325 MG PO TABS
650.0000 mg | ORAL_TABLET | ORAL | Status: DC | PRN
  Administered 2022-07-16: 650 mg via ORAL
  Filled 2022-07-15: qty 2

## 2022-07-15 MED ORDER — FENTANYL CITRATE (PF) 100 MCG/2ML IJ SOLN
INTRAMUSCULAR | Status: AC
  Filled 2022-07-15: qty 2

## 2022-07-15 MED ORDER — DOCUSATE SODIUM 100 MG PO CAPS
100.0000 mg | ORAL_CAPSULE | Freq: Two times a day (BID) | ORAL | Status: DC
  Administered 2022-07-15: 100 mg via ORAL
  Filled 2022-07-15 (×2): qty 1

## 2022-07-15 MED ORDER — DEXAMETHASONE SODIUM PHOSPHATE 10 MG/ML IJ SOLN
INTRAMUSCULAR | Status: DC | PRN
  Administered 2022-07-15: 10 mg via INTRAVENOUS

## 2022-07-15 MED ORDER — SODIUM CHLORIDE 0.9 % IR SOLN
Status: DC | PRN
  Administered 2022-07-15 (×7): 3000 mL

## 2022-07-15 MED ORDER — ROCURONIUM BROMIDE 10 MG/ML (PF) SYRINGE
PREFILLED_SYRINGE | INTRAVENOUS | Status: AC
  Filled 2022-07-15: qty 10

## 2022-07-15 MED ORDER — LIDOCAINE HCL (PF) 2 % IJ SOLN
INTRAMUSCULAR | Status: AC
  Filled 2022-07-15: qty 5

## 2022-07-15 MED ORDER — SODIUM CHLORIDE 0.9 % IR SOLN
Status: DC | PRN
  Administered 2022-07-15: 3000 mL

## 2022-07-15 MED ORDER — DILTIAZEM HCL ER COATED BEADS 120 MG PO CP24
120.0000 mg | ORAL_CAPSULE | Freq: Every day | ORAL | Status: DC
  Filled 2022-07-15: qty 1

## 2022-07-15 MED ORDER — ZOLPIDEM TARTRATE 5 MG PO TABS: 5.0000 mg | ORAL_TABLET | Freq: Every evening | ORAL | Status: DC | PRN

## 2022-07-15 MED ORDER — DEXAMETHASONE SODIUM PHOSPHATE 10 MG/ML IJ SOLN
INTRAMUSCULAR | Status: AC
  Filled 2022-07-15: qty 1

## 2022-07-15 MED ORDER — ORAL CARE MOUTH RINSE: 15.0000 mL | Freq: Once | OROMUCOSAL | Status: DC

## 2022-07-15 MED ORDER — CHLORHEXIDINE GLUCONATE 0.12 % MT SOLN: 15.0000 mL | Freq: Once | OROMUCOSAL | Status: DC

## 2022-07-15 MED ORDER — SODIUM CHLORIDE 0.9 % IR SOLN
3000.0000 mL | Status: DC
  Administered 2022-07-15 – 2022-07-16 (×4): 3000 mL

## 2022-07-15 MED ORDER — FENTANYL CITRATE PF 50 MCG/ML IJ SOSY: 25.0000 ug | PREFILLED_SYRINGE | INTRAMUSCULAR | Status: DC | PRN

## 2022-07-15 MED ORDER — ALLOPURINOL 300 MG PO TABS
300.0000 mg | ORAL_TABLET | Freq: Every day | ORAL | Status: DC
  Filled 2022-07-15: qty 1

## 2022-07-15 MED ORDER — OXYCODONE HCL 5 MG PO TABS: 5.0000 mg | ORAL_TABLET | ORAL | Status: DC | PRN

## 2022-07-15 MED ORDER — DIPHENHYDRAMINE HCL 12.5 MG/5ML PO ELIX: 12.5000 mg | ORAL_SOLUTION | Freq: Four times a day (QID) | ORAL | Status: DC | PRN

## 2022-07-15 MED ORDER — PROPOFOL 10 MG/ML IV BOLUS
INTRAVENOUS | Status: DC | PRN
  Administered 2022-07-15: 160 mg via INTRAVENOUS

## 2022-07-15 MED ORDER — ONDANSETRON HCL 4 MG/2ML IJ SOLN: 4.0000 mg | INTRAMUSCULAR | Status: DC | PRN

## 2022-07-15 MED ORDER — EPHEDRINE 5 MG/ML INJ
INTRAVENOUS | Status: AC
  Filled 2022-07-15: qty 5

## 2022-07-15 MED ORDER — SEVOFLURANE IN SOLN
RESPIRATORY_TRACT | Status: AC
  Filled 2022-07-15: qty 250

## 2022-07-15 MED ORDER — FENTANYL CITRATE (PF) 100 MCG/2ML IJ SOLN
INTRAMUSCULAR | Status: DC | PRN
  Administered 2022-07-15: 50 ug via INTRAVENOUS
  Administered 2022-07-15 (×2): 25 ug via INTRAVENOUS

## 2022-07-15 MED ORDER — SENNOSIDES-DOCUSATE SODIUM 8.6-50 MG PO TABS
2.0000 | ORAL_TABLET | Freq: Every day | ORAL | Status: DC
  Administered 2022-07-15: 2 via ORAL
  Filled 2022-07-15: qty 2

## 2022-07-15 MED ORDER — SUGAMMADEX SODIUM 200 MG/2ML IV SOLN
INTRAVENOUS | Status: DC | PRN
  Administered 2022-07-15: 200 mg via INTRAVENOUS

## 2022-07-15 MED ORDER — DIPHENHYDRAMINE HCL 50 MG/ML IJ SOLN: 12.5000 mg | Freq: Four times a day (QID) | INTRAMUSCULAR | Status: DC | PRN

## 2022-07-15 SURGICAL SUPPLY — 25 items
BAG DRAIN URO TABLE W/ADPT NS (BAG) ×1 IMPLANT
BAG DRN 8 ADPR NS SKTRN CSTL (BAG) ×1
BAG DRN URN TUBE DRIP CHMBR (OSTOMY) ×1
BAG HAMPER (MISCELLANEOUS) ×1 IMPLANT
BAG URINE DRAIN TURP 4L (OSTOMY) ×1 IMPLANT
CATH FOLEY 3WAY 30CC 22F (CATHETERS) ×1 IMPLANT
CLOTH BEACON ORANGE TIMEOUT ST (SAFETY) ×1 IMPLANT
ELECT REM PT RETURN 9FT ADLT (ELECTROSURGICAL) ×1
ELECTRODE REM PT RTRN 9FT ADLT (ELECTROSURGICAL) ×1 IMPLANT
GLOVE BIO SURGEON STRL SZ8 (GLOVE) ×1 IMPLANT
GLOVE BIOGEL PI IND STRL 7.0 (GLOVE) ×2 IMPLANT
GOWN STRL REUS W/TWL LRG LVL3 (GOWN DISPOSABLE) ×2 IMPLANT
GOWN STRL REUS W/TWL XL LVL3 (GOWN DISPOSABLE) ×1 IMPLANT
IV NS IRRIG 3000ML ARTHROMATIC (IV SOLUTION) ×4 IMPLANT
KIT TURNOVER CYSTO (KITS) ×1 IMPLANT
LOOP CUT BIPOLAR 24F LRG (ELECTROSURGICAL) ×1 IMPLANT
MANIFOLD NEPTUNE II (INSTRUMENTS) ×1 IMPLANT
PACK CYSTO (CUSTOM PROCEDURE TRAY) ×1 IMPLANT
PAD ARMBOARD 7.5X6 YLW CONV (MISCELLANEOUS) ×1 IMPLANT
SYR 30ML LL (SYRINGE) ×1 IMPLANT
SYR TOOMEY IRRIG 70ML (MISCELLANEOUS) ×1
SYRINGE TOOMEY IRRIG 70ML (MISCELLANEOUS) ×1 IMPLANT
TOWEL NATURAL 4PK STERILE (DISPOSABLE) ×1 IMPLANT
TOWEL OR 17X26 4PK STRL BLUE (TOWEL DISPOSABLE) ×1 IMPLANT
WATER STERILE IRR 500ML POUR (IV SOLUTION) ×1 IMPLANT

## 2022-07-15 NOTE — Transfer of Care (Signed)
Immediate Anesthesia Transfer of Care Note  Patient: DIJON COSENS  Procedure(s) Performed: TRANSURETHRAL RESECTION OF THE PROSTATE (TURP) (Prostate) CYSTOSCOPY (Prostate)  Patient Location: PACU  Anesthesia Type:General  Level of Consciousness: awake  Airway & Oxygen Therapy: Patient Spontanous Breathing and Patient connected to nasal cannula oxygen  Post-op Assessment: Report given to RN and Post -op Vital signs reviewed and stable  Post vital signs: Reviewed and stable  Last Vitals:  Vitals Value Taken Time  BP 116/64 07/15/22 0904  Temp 36.6 C 07/15/22 0904  Pulse 71 07/15/22 0905  Resp 13 07/15/22 0905  SpO2 97 % 07/15/22 0905  Vitals shown include unvalidated device data.  Last Pain:  Vitals:   07/15/22 0715  TempSrc: Oral  PainSc: 0-No pain      Patients Stated Pain Goal: 6 (57/84/69 6295)  Complications: No notable events documented.

## 2022-07-15 NOTE — Anesthesia Preprocedure Evaluation (Signed)
Anesthesia Evaluation  Patient identified by MRN, date of birth, ID band Patient awake    Reviewed: Allergy & Precautions, H&P , NPO status , Patient's Chart, lab work & pertinent test results  Airway Mallampati: III  TM Distance: >3 FB Neck ROM: Full    Dental  (+) Implants   Pulmonary shortness of breath and with exertion, pneumonia, former smoker   Pulmonary exam normal breath sounds clear to auscultation       Cardiovascular hypertension, Pt. on medications + dysrhythmias Atrial Fibrillation  Rhythm:Irregular Rate:Normal  2018- Left ventricle: The cavity size was normal. Wall thickness was    normal. Systolic function was normal. The estimated ejection    fraction was in the range of 60% to 65%. Wall motion was normal;    there were no regional wall motion abnormalities. The study was    not technically sufficient to allow evaluation of LV diastolic    dysfunction due to atrial fibrillation.  - Aorta: Mild aortic root dilatation.  - Left atrium: The atrium was moderately to severely dilated.  - Tricuspid valve: There was mild regurgitation.     Neuro/Psych negative neurological ROS  negative psych ROS   GI/Hepatic negative GI ROS, Neg liver ROS,,,  Endo/Other  negative endocrine ROS    Renal/GU Renal InsufficiencyRenal disease  negative genitourinary   Musculoskeletal negative musculoskeletal ROS (+)    Abdominal   Peds negative pediatric ROS (+)  Hematology negative hematology ROS (+)   Anesthesia Other Findings 14-Mar-2022 15:05:12 [facility] [place] [facility]-ER ROUTINE RECORD [id number] (51 yr) Male Caucasian  Vent. rate 70 BPM PR interval * ms QRS duration 94 ms QT/QTcB 420/454 ms P-R-T axes * 67 45 Atrial fibrillation Anteroseptal infarct, age indeterminate Confirmed by Georgina Snell [phone]) on 03/14/2022 3:08:30 PM  Reproductive/Obstetrics negative OB ROS                               Anesthesia Physical Anesthesia Plan  ASA: 3  Anesthesia Plan: General   Post-op Pain Management: Dilaudid IV   Induction: Intravenous  PONV Risk Score and Plan: 3 and Ondansetron and Dexamethasone  Airway Management Planned: Oral ETT  Additional Equipment:   Intra-op Plan:   Post-operative Plan: Extubation in OR  Informed Consent: I have reviewed the patients History and Physical, chart, labs and discussed the procedure including the risks, benefits and alternatives for the proposed anesthesia with the patient or authorized representative who has indicated his/her understanding and acceptance.     Dental advisory given  Plan Discussed with: CRNA and Surgeon  Anesthesia Plan Comments:          Anesthesia Quick Evaluation

## 2022-07-15 NOTE — Op Note (Signed)
Preoperative diagnosis: BPH  Postoperative diagnosis: BPH  Procedure: 1 cystoscopy 2. Transurethral resection of the prostate  Attending: [name]  Anesthesia: General  Estimated blood loss: Minimal  Drains: 22 French foley  Specimens: 1. Prostate Chips  Antibiotics: Rocephin  Findings: Bilobar prostate enlargement. Ureteral orifices in normal anatomic location.   Indications: Patient is a 72 year old male with a history of BPH and elevated PVR.  After discussing treatment options, they decided proceed with transurethral resection of the prostate.  Procedure in detail: The patient was brought to the operating room and a brief timeout was done to ensure correct patient, correct procedure, correct site.  General anesthesia was administered patient was placed in dorsal lithotomy position.  Their genitalia was then prepped and draped in usual sterile fashion.  A rigid 60 French cystoscope was passed in the urethra and the bladder.  Bladder was inspected and we noted no masses or lesions.  the ureteral orifices were in the normal orthotopic locations. removed the cystoscope and placed a resectoscope into the bladder. We then turned our attention to the prostate resection. Using the bipolar resectoscope we resected the median lobe first from the bladder neck to the verumontanum. We then started at the 12 oclock position on the left lobe and resection to the 6 o'clock position from the bladder neck to the verumontanum. We then did the same resection of the right lobe. Once the resection was complete we then cauterized individual bleeders. We then removed the prostate chips and sent them for pathology.  We then re-inspected the prostatic fossa and found no residual bleeding.  the bladder was then drained, a 22 French foley was placed and this concluded the procedure which was well tolerated by patient.  Complications: None  Condition: Stable, extubated, transferred to PACU  Plan: Patient  is admitted overnight with continuous bladder irrigation. If their urine is clear tomorrow they will be discharged home and followup in 5 days for foley catheter removal and pathology discussion.

## 2022-07-15 NOTE — Anesthesia Postprocedure Evaluation (Signed)
Anesthesia Post Note  Patient: Geoffrey Patterson  Procedure(s) Performed: TRANSURETHRAL RESECTION OF THE PROSTATE (TURP) (Prostate) CYSTOSCOPY (Prostate)  Patient location during evaluation: PACU Anesthesia Type: General Level of consciousness: awake and alert and oriented Pain management: pain level controlled Vital Signs Assessment: post-procedure vital signs reviewed and stable Respiratory status: spontaneous breathing, nonlabored ventilation and respiratory function stable Cardiovascular status: blood pressure returned to baseline and stable Postop Assessment: no apparent nausea or vomiting Anesthetic complications: no  No notable events documented.   Last Vitals:  Vitals:   07/15/22 0953 07/15/22 1102  BP: 122/75 129/78  Pulse: 68 63  Resp: 14 18  Temp:  (!) 36.3 C  SpO2: 96% 98%    Last Pain:  Vitals:   07/15/22 1102  TempSrc: Oral  PainSc: 0-No pain                 [name]

## 2022-07-15 NOTE — Interval H&P Note (Signed)
History and Physical Interval Note:  07/15/2022 7:26 AM  Geoffrey Patterson  has presented today for surgery, with the diagnosis of Benign Prostatic Hyperplasia with urinary retention.  The various methods of treatment have been discussed with the patient and family. After consideration of risks, benefits and other options for treatment, the patient has consented to  Procedure(s): TRANSURETHRAL RESECTION OF THE PROSTATE (TURP) (N/A) CYSTOSCOPY (N/A) as a surgical intervention.  The patient's history has been reviewed, patient examined, no change in status, stable for surgery.  I have reviewed the patient's chart and labs.  Questions were answered to the patient's satisfaction.     [name]

## 2022-07-15 NOTE — Anesthesia Procedure Notes (Signed)
Procedure Name: Intubation Date/Time: 07/15/2022 7:37 AM  Performed by: Karna Dupes, CRNAPre-anesthesia Checklist: Patient identified, Emergency Drugs available, Suction available and Patient being monitored Patient Re-evaluated:Patient Re-evaluated prior to induction Oxygen Delivery Method: Circle system utilized Preoxygenation: Pre-oxygenation with 100% oxygen Induction Type: IV induction Ventilation: Mask ventilation without difficulty Laryngoscope Size: Mac and 4 Grade View: Grade II Tube type: Oral Tube size: 7.5 mm Number of attempts: 1 Airway Equipment and Method: Stylet Placement Confirmation: ETT inserted through vocal cords under direct vision, positive ETCO2 and breath sounds checked- equal and bilateral Secured at: 22 cm Tube secured with: Tape Dental Injury: Teeth and Oropharynx as per pre-operative assessment

## 2022-07-16 ENCOUNTER — Telehealth: Payer: Self-pay

## 2022-07-16 DIAGNOSIS — N400 Enlarged prostate without lower urinary tract symptoms: Secondary | ICD-10-CM | POA: Diagnosis not present

## 2022-07-16 DIAGNOSIS — I10 Essential (primary) hypertension: Secondary | ICD-10-CM | POA: Diagnosis not present

## 2022-07-16 LAB — CBC
HCT: 39.1 % (ref 39.0–52.0)
Hemoglobin: 13.1 g/dL (ref 13.0–17.0)
MCH: 29.6 pg (ref 26.0–34.0)
MCHC: 33.5 g/dL (ref 30.0–36.0)
MCV: 88.5 fL (ref 80.0–100.0)
Platelets: 227 10*3/uL (ref 150–400)
RBC: 4.42 MIL/uL (ref 4.22–5.81)
RDW: 14.6 % (ref 11.5–15.5)
WBC: 19.1 10*3/uL — ABNORMAL HIGH (ref 4.0–10.5)
nRBC: 0 % (ref 0.0–0.2)

## 2022-07-16 LAB — BASIC METABOLIC PANEL
Anion gap: 4 — ABNORMAL LOW (ref 5–15)
BUN: 15 mg/dL (ref 8–23)
CO2: 21 mmol/L — ABNORMAL LOW (ref 22–32)
Calcium: 8.6 mg/dL — ABNORMAL LOW (ref 8.9–10.3)
Chloride: 111 mmol/L (ref 98–111)
Creatinine, Ser: 0.86 mg/dL (ref 0.61–1.24)
GFR, Estimated: >60 mL/min (ref >60)
Glucose, Bld: 146 mg/dL — ABNORMAL HIGH (ref 70–99)
Potassium: 4.3 mmol/L (ref 3.5–5.1)
Sodium: 136 mmol/L (ref 135–145)

## 2022-07-16 LAB — SURGICAL PATHOLOGY

## 2022-07-16 MED ORDER — HYDROCODONE-ACETAMINOPHEN 5-325 MG PO TABS: 1.0000 | ORAL_TABLET | Freq: Four times a day (QID) | ORAL | 0 refills | Status: DC | PRN

## 2022-07-16 NOTE — Telephone Encounter (Signed)
Patient is aware that he will have a voiding trial on 02/06 and then a follow up with Dr. Alyson Ingles on 02/14 for PVR and pathology discussion. Patient voiced understanding

## 2022-07-16 NOTE — Progress Notes (Signed)
CBI stopped and foley plug in place. Patient educated on care and maintenance of foley catheter. D/C instruction reviewed with patient, verbalized understanding. IV removed. Patient to be transported to private vehicle via wheelchair.

## 2022-07-16 NOTE — Progress Notes (Signed)
Patient remains on continuous CBI irrigation, output is presenting with no clots.

## 2022-07-16 NOTE — Discharge Summary (Signed)
Physician Discharge Summary  Patient ID: Geoffrey Patterson MRN: [id number] DOB/AGE: June 06, 1951 72 y.o.  Admit date: 07/15/2022 Discharge date: 07/16/2022  Admission Diagnoses:  BPH (benign prostatic hyperplasia)  Discharge Diagnoses:  Principal Problem:   BPH (benign prostatic hyperplasia)   Past Medical History:  Diagnosis Date   Dysrhythmia    a-fib history   Gout    Hypertension     Surgeries: Procedure(s): TRANSURETHRAL RESECTION OF THE PROSTATE (TURP) CYSTOSCOPY on 07/15/2022   Consultants (if any):   Discharged Condition: Improved  Hospital Course: Geoffrey Patterson is an 72 y.o. male who was admitted 07/15/2022 with a diagnosis of BPH (benign prostatic hyperplasia) and went to the operating room on 07/15/2022 and underwent the above named procedures.    He was given perioperative antibiotics:  Anti-infectives (From admission, onward)    Start     Dose/Rate Route Frequency Ordered Stop   07/15/22 0644  cefTRIAXone (ROCEPHIN) 2 g in sodium chloride 0.9 % 100 mL IVPB        2 g 200 mL/hr over 30 Minutes Intravenous 30 min pre-op 07/15/22 0644 07/16/22 0846     .  He was given sequential compression devices, early ambulation for DVT prophylaxis.  He benefited maximally from the hospital stay and there were no complications.    Recent vital signs:  Vitals:   07/15/22 1819 07/16/22 0527  BP: 132/64 136/88  Pulse: 76 63  Resp: 16 18  Temp: 98.2 F (36.8 C) 97.9 F (36.6 C)  SpO2: 98%     Recent laboratory studies:  Lab Results  Component Value Date   HGB 13.1 07/16/2022   HGB 12.4 (L) 07/15/2022   HGB 11.0 (L) 03/22/2022   Lab Results  Component Value Date   WBC 19.1 (H) 07/16/2022   PLT 227 07/16/2022   No results found for: "INR" Lab Results  Component Value Date   NA 136 07/16/2022   K 4.3 07/16/2022   CL 111 07/16/2022   CO2 21 (L) 07/16/2022   BUN 15 07/16/2022   CREATININE 0.86 07/16/2022   GLUCOSE 146 (H) 07/16/2022    Discharge  Medications:   Allergies as of 07/16/2022   No Known Allergies      Medication List     STOP taking these medications    silodosin 8 MG Caps capsule Commonly known as: RAPAFLO       TAKE these medications    allopurinol 300 MG tablet Commonly known as: ZYLOPRIM Take 1 tablet (300 mg total) by mouth daily.   diltiazem 120 MG 24 hr capsule Commonly known as: CARDIZEM CD TAKE 1 CAPSULE BY MOUTH EVERY DAY   fexofenadine 180 MG tablet Commonly known as: ALLEGRA Take 1 tablet (180 mg total) by mouth daily.   finasteride 5 MG tablet Commonly known as: PROSCAR Take 1 tablet (5 mg total) by mouth daily. What changed: when to take this   HYDROcodone-acetaminophen 5-325 MG tablet Commonly known as: Norco Take 1 tablet by mouth every 6 (six) hours as needed for moderate pain.   rosuvastatin 5 MG tablet Commonly known as: CRESTOR Take 1 tablet (5 mg total) by mouth daily. What changed: when to take this   sennosides-docusate sodium 8.6-50 MG tablet Commonly known as: SENOKOT-S Take 2 tablets by mouth daily.   tamsulosin 0.4 MG Caps capsule Commonly known as: FLOMAX Take 1 capsule (0.4 mg total) by mouth daily after supper.   traMADol 50 MG tablet Commonly known as: Ultram Take 1 tablet (50  mg total) by mouth every 6 (six) hours as needed.   valACYclovir 1000 MG tablet Commonly known as: VALTREX Take 2 tablets (2,000 mg total) by mouth 2 (two) times daily as needed (fever blisters).        Diagnostic Studies: No results found.  Disposition: Discharge disposition: 01-Home or Self Care       Discharge Instructions     Discharge patient   Complete by: As directed    Discharge disposition: 01-Home or Self Care   Discharge patient date: 07/16/2022        Follow-up Information     Cleon Gustin, MD. Call in 1 week(s).   Specialty: Urology Contact information: [address]  [place] [address] [phone]                   Signed: [name] 07/16/2022, 2:36 PM

## 2022-07-20 ENCOUNTER — Ambulatory Visit (INDEPENDENT_AMBULATORY_CARE_PROVIDER_SITE_OTHER): Payer: Medicare HMO | Admitting: Urology

## 2022-07-20 DIAGNOSIS — N401 Enlarged prostate with lower urinary tract symptoms: Secondary | ICD-10-CM | POA: Diagnosis not present

## 2022-07-20 DIAGNOSIS — N138 Other obstructive and reflux uropathy: Secondary | ICD-10-CM | POA: Diagnosis not present

## 2022-07-20 NOTE — Progress Notes (Signed)
Fill and Pull Catheter Removal  Patient is present today for a catheter removal.  Patient was cleaned and prepped in a sterile fashion 133m of sterile water/ saline was instilled into the bladder when the patient felt the urge to urinate. 215mof water was then drained from the balloon.  A 22FR foley cath was removed from the bladder no complications were noted .  Patient as then given some time to void on their own.  Patient can void  15044mn their own after some time.  Patient tolerated well.  Performed by: Tanesha Arambula LPN   Follow up/ Additional notes: keep scheduled post op

## 2022-07-21 ENCOUNTER — Encounter (HOSPITAL_COMMUNITY): Payer: Self-pay | Admitting: Urology

## 2022-07-26 ENCOUNTER — Other Ambulatory Visit: Payer: Medicare HMO | Admitting: Urology

## 2022-07-27 ENCOUNTER — Ambulatory Visit (INDEPENDENT_AMBULATORY_CARE_PROVIDER_SITE_OTHER): Payer: Medicare HMO | Admitting: Urology

## 2022-07-27 ENCOUNTER — Encounter: Payer: Self-pay | Admitting: Urology

## 2022-07-27 VITALS — BP 145/89 | HR 69

## 2022-07-27 DIAGNOSIS — N138 Other obstructive and reflux uropathy: Secondary | ICD-10-CM

## 2022-07-27 DIAGNOSIS — N401 Enlarged prostate with lower urinary tract symptoms: Secondary | ICD-10-CM

## 2022-07-27 DIAGNOSIS — R339 Retention of urine, unspecified: Secondary | ICD-10-CM

## 2022-07-27 NOTE — Progress Notes (Signed)
07/27/2022 12:08 PM   Geoffrey Patterson 02-11-1951 [id number]  Referring provider: Celene Squibb, MD [address] Geoffrey [place],  [place] [address]  Followup urinary retention   HPI: Geoffrey Patterson is a 72yo here for followup for BPh with urinary retention. PVR 31cc. He is doing well after TURP. No dysuria or hematuria.    PMH: Past Medical History:  Diagnosis Date   Dysrhythmia    a-fib history   Gout    Hypertension     Surgical History: Past Surgical History:  Procedure Laterality Date   COLONOSCOPY     COLONOSCOPY N/A 12/30/2016   Procedure: COLONOSCOPY;  Surgeon: Rogene Houston, MD;  [facility];  Service: Endoscopy;  Laterality: N/A;  [place] N/A 04/15/2022   Procedure: CYSTOSCOPY;  Surgeon: Cleon Gustin, MD;  Location: [facility];  Service: Urology;  Laterality: N/A;   CYSTOSCOPY N/A 07/15/2022   Procedure: CYSTOSCOPY;  Surgeon: Cleon Gustin, MD;  Location: [facility];  Service: Urology;  Laterality: N/A;   CYSTOSCOPY WITH INSERTION OF UROLIFT N/A 05/27/2022   Procedure: CYSTOSCOPY WITH INSERTION OF UROLIFT;  Surgeon: Cleon Gustin, MD;  Location: [facility];  Service: Urology;  Laterality: N/A;   POLYPECTOMY  12/30/2016   Procedure: POLYPECTOMY;  Surgeon: Rogene Houston, MD;  [facility];  Service: Endoscopy;;  colon    REVERSE SHOULDER ARTHROPLASTY Left 03/07/2022   Procedure: REVERSE SHOULDER ARTHROPLASTY;  Surgeon: Marchia Bond, MD;  Location: [facility];  Service: Orthopedics;  Laterality: Left;   TONSILLECTOMY     TRANSURETHRAL RESECTION OF BLADDER TUMOR N/A 04/15/2022   Procedure: TRANSURETHRAL RESECTION OF BLADDER TUMOR (TURBT);  Surgeon: Cleon Gustin, MD;  Location: [facility];  Service: Urology;  Laterality: N/A;   TRANSURETHRAL RESECTION OF PROSTATE N/A 07/15/2022   Procedure: TRANSURETHRAL RESECTION OF THE PROSTATE (TURP);  Surgeon: Cleon Gustin, MD;  Location: [facility];  Service: Urology;   Laterality: N/A;    Home Medications:  Allergies as of 07/27/2022   No Known Allergies      Medication List        Accurate as of July 27, 2022 12:08 PM. If you have any questions, ask your nurse or doctor.          allopurinol 300 MG tablet Commonly known as: ZYLOPRIM Take 1 tablet (300 mg total) by mouth daily.   diltiazem 120 MG 24 hr capsule Commonly known as: CARDIZEM CD TAKE 1 CAPSULE BY MOUTH EVERY DAY   fexofenadine 180 MG tablet Commonly known as: ALLEGRA Take 1 tablet (180 mg total) by mouth daily.   finasteride 5 MG tablet Commonly known as: PROSCAR Take 1 tablet (5 mg total) by mouth daily. What changed: when to take this   HYDROcodone-acetaminophen 5-325 MG tablet Commonly known as: Norco Take 1 tablet by mouth every 6 (six) hours as needed for moderate pain.   rosuvastatin 5 MG tablet Commonly known as: CRESTOR Take 1 tablet (5 mg total) by mouth daily. What changed: when to take this   sennosides-docusate sodium 8.6-50 MG tablet Commonly known as: SENOKOT-S Take 2 tablets by mouth daily.   silodosin 8 MG Caps capsule Commonly known as: RAPAFLO TAKE 1 CAPSULE (8 MG TOTAL) BY MOUTH 2 (TWO) TIMES DAILY.   tamsulosin 0.4 MG Caps capsule Commonly known as: FLOMAX Take 1 capsule (0.4 mg total) by mouth daily after supper.   traMADol 50 MG tablet Commonly  known as: Ultram Take 1 tablet (50 mg total) by mouth every 6 (six) hours as needed.   valACYclovir 1000 MG tablet Commonly known as: VALTREX Take 2 tablets (2,000 mg total) by mouth 2 (two) times daily as needed (fever blisters).        Allergies: No Known Allergies  Family History: Family History  Problem Relation Age of Onset   Hypertension Mother    Hypertension Father    Diabetic kidney disease Father     Social History:  reports that he has quit smoking. His smoking use included cigarettes. He has never used smokeless tobacco. He reports that he does not drink alcohol  and does not use drugs.  ROS: All other review of systems were reviewed and are negative except what is noted above in HPI  Physical Exam: BP (!) 145/89   Pulse 69   Constitutional:  Alert and oriented, No acute distress. HEENT: [place] AT, moist mucus membranes.  Trachea midline, no masses. Cardiovascular: No clubbing, cyanosis, or edema. Respiratory: Normal respiratory effort, no increased work of breathing. GI: Abdomen is soft, nontender, nondistended, no abdominal masses GU: No CVA tenderness.  Lymph: No cervical or inguinal lymphadenopathy. Skin: No rashes, bruises or suspicious lesions. Neurologic: Grossly intact, no focal deficits, moving all 4 extremities. Psychiatric: Normal mood and affect.  Laboratory Data: Lab Results  Component Value Date   WBC 19.1 (H) 07/16/2022   HGB 13.1 07/16/2022   HCT 39.1 07/16/2022   MCV 88.5 07/16/2022   PLT 227 07/16/2022    Lab Results  Component Value Date   CREATININE 0.86 07/16/2022    No results found for: "PSA"  No results found for: "TESTOSTERONE"  No results found for: "HGBA1C"  Urinalysis    Component Value Date/Time   COLORURINE AMBER (A) 03/14/2022 1309   APPEARANCEUR Hazy (A) 04/29/2022 1516   LABSPEC 1.010 03/14/2022 1309   PHURINE 6.0 03/14/2022 1309   GLUCOSEU Negative 04/29/2022 1516   HGBUR LARGE (A) 03/14/2022 1309   BILIRUBINUR Negative 04/29/2022 1516   KETONESUR NEGATIVE 03/14/2022 1309   PROTEINUR Negative 04/29/2022 1516   PROTEINUR 100 (A) 03/14/2022 1309   NITRITE Negative 04/29/2022 1516   NITRITE NEGATIVE 03/14/2022 1309   LEUKOCYTESUR 2+ (A) 04/29/2022 1516   LEUKOCYTESUR LARGE (A) 03/14/2022 1309    Lab Results  Component Value Date   LABMICR See below: 04/29/2022   WBCUA 11-30 (A) 04/29/2022   LABEPIT 0-10 04/29/2022   BACTERIA Few 04/29/2022    Pertinent Imaging:  No results found for this or any previous visit.  No results found for this or any previous visit.  No results found  for this or any previous visit.  No results found for this or any previous visit.  Results for orders placed during the hospital encounter of 03/14/22  US Renal  Narrative CLINICAL DATA:  Acute kidney injury.  EXAM: RENAL / URINARY TRACT ULTRASOUND COMPLETE  COMPARISON:  None Available.  FINDINGS: Right Kidney:  Renal measurements: 12.6 x 7.6 x 6.1 cm = volume: 305 mL. Echogenicity within normal limits. No mass or hydronephrosis visualized.  Left Kidney:  Renal measurements: 13.1 x 7.9 x 6.6 cm = volume: 358 mL. Echogenicity within normal limits. No mass or hydronephrosis visualized.  Bladder:  There appears to be a solid mass in the urinary bladder measuring 6.2 x 5.5 x 2.9 cm.  Other:  None.  IMPRESSION: Kidneys are unremarkable. However, there appears to be a solid mass in the urinary bladder measuring 6.2  x 5.5 x 2.9 cm. Further evaluation with CT scan or cystoscopy is recommended. These results will be called to the ordering clinician or representative by the Radiologist Assistant, and communication documented in the PACS or zVision Dashboard.   Electronically Signed By: Marijo Conception M.D. On: 03/15/2022 13:17  No valid procedures specified. No results found for this or any previous visit.  No results found for this or any previous visit.   Assessment & Plan:    1. Urinary retention -followup 3 months with PVR - BLADDER SCAN AMB NON-IMAGING  2. Benign prostatic hyperplasia with urinary obstruction -Followup 3 months with PVR   No follow-ups on file.  [name], MD  [facility] [place]

## 2022-07-27 NOTE — Progress Notes (Signed)
post void residual=31 

## 2022-07-27 NOTE — Patient Instructions (Signed)
Benign Prostatic Hyperplasia ? ?Benign prostatic hyperplasia (BPH) is an enlarged prostate gland that is caused by the normal aging process. The prostate may get bigger as a man gets older. The condition is not caused by cancer. The prostate is a walnut-sized gland that is involved in the production of semen. It is located in front of the rectum and below the bladder. The bladder stores urine. The urethra carries stored urine out of the body. ?An enlarged prostate can press on the urethra. This can make it harder to pass urine. The buildup of urine in the bladder can cause infection. Back pressure and infection may progress to bladder damage and kidney (renal) failure. ?What are the causes? ?This condition is part of the normal aging process. However, not all men develop problems from this condition. If the prostate enlarges away from the urethra, urine flow will not be blocked. If it enlarges toward the urethra and compresses it, there will be problems passing urine. ?What increases the risk? ?This condition is more likely to develop in men older than 50 years. ?What are the signs or symptoms? ?Symptoms of this condition include: ?Getting up often during the night to urinate. ?Needing to urinate frequently during the day. ?Difficulty starting urine flow. ?Decrease in size and strength of your urine stream. ?Leaking (dribbling) after urinating. ?Inability to pass urine. This needs immediate treatment. ?Inability to completely empty your bladder. ?Pain when you pass urine. This is more common if there is also an infection. ?Urinary tract infection (UTI). ?How is this diagnosed? ?This condition is diagnosed based on your medical history, a physical exam, and your symptoms. Tests will also be done, such as: ?A post-void bladder scan. This measures any amount of urine that may remain in your bladder after you finish urinating. ?A digital rectal exam. In a rectal exam, your health care provider checks your prostate by  putting a lubricated, gloved finger into your rectum to feel the back of your prostate gland. This exam detects the size of your gland and any abnormal lumps or growths. ?An exam of your urine (urinalysis). ?A prostate specific antigen (PSA) screening. This is a blood test used to screen for prostate cancer. ?An ultrasound. This test uses sound waves to electronically produce a picture of your prostate gland. ?Your health care provider may refer you to a specialist in kidney and prostate diseases (urologist). ?How is this treated? ?Once symptoms begin, your health care provider will monitor your condition (active surveillance or watchful waiting). Treatment for this condition will depend on the severity of your condition. Treatment may include: ?Observation and yearly exams. This may be the only treatment needed if your condition and symptoms are mild. ?Medicines to relieve your symptoms, including: ?Medicines to shrink the prostate. ?Medicines to relax the muscle of the prostate. ?Surgery in severe cases. Surgery may include: ?Prostatectomy. In this procedure, the prostate tissue is removed completely through an open incision or with a laparoscope or robotics. ?Transurethral resection of the prostate (TURP). In this procedure, a tool is inserted through the opening at the tip of the penis (urethra). It is used to cut away tissue of the inner core of the prostate. The pieces are removed through the same opening of the penis. This removes the blockage. ?Transurethral incision (TUIP). In this procedure, small cuts are made in the prostate. This lessens the prostate's pressure on the urethra. ?Transurethral microwave thermotherapy (TUMT). This procedure uses microwaves to create heat. The heat destroys and removes a small amount of   prostate tissue. ?Transurethral needle ablation (TUNA). This procedure uses radio frequencies to destroy and remove a small amount of prostate tissue. ?Interstitial laser coagulation (ILC).  This procedure uses a laser to destroy and remove a small amount of prostate tissue. ?Transurethral electrovaporization (TUVP). This procedure uses electrodes to destroy and remove a small amount of prostate tissue. ?Prostatic urethral lift. This procedure inserts an implant to push the lobes of the prostate away from the urethra. ?Follow these instructions at home: ?Take over-the-counter and prescription medicines only as told by your health care provider. ?Monitor your symptoms for any changes. Contact your health care provider with any changes. ?Avoid drinking large amounts of liquid before going to bed or out in public. ?Avoid or reduce how much caffeine or alcohol you drink. ?Give yourself time when you urinate. ?Keep all follow-up visits. This is important. ?Contact a health care provider if: ?You have unexplained back pain. ?Your symptoms do not get better with treatment. ?You develop side effects from the medicine you are taking. ?Your urine becomes very dark or has a bad smell. ?Your lower abdomen becomes distended and you have trouble passing urine. ?Get help right away if: ?You have a fever or chills. ?You suddenly cannot urinate. ?You feel light-headed or very dizzy, or you faint. ?There are large amounts of blood or clots in your urine. ?Your urinary problems become hard to manage. ?You develop moderate to severe low back or flank pain. The flank is the side of your body between the ribs and the hip. ?These symptoms may be an emergency. Get help right away. Call 911. ?Do not wait to see if the symptoms will go away. ?Do not drive yourself to the hospital. ?Summary ?Benign prostatic hyperplasia (BPH) is an enlarged prostate that is caused by the normal aging process. It is not caused by cancer. ?An enlarged prostate can press on the urethra. This can make it hard to pass urine. ?This condition is more likely to develop in men older than 50 years. ?Get help right away if you suddenly cannot urinate. ?This  information is not intended to replace advice given to you by your health care provider. Make sure you discuss any questions you have with your health care provider. ?Document Revised: 12/17/2020 Document Reviewed: 12/17/2020 ?Elsevier Patient Education ? 2023 [employer] ? ?

## 2022-07-28 ENCOUNTER — Ambulatory Visit: Payer: Medicare HMO | Admitting: Urology

## 2022-08-05 DIAGNOSIS — Z125 Encounter for screening for malignant neoplasm of prostate: Secondary | ICD-10-CM | POA: Diagnosis not present

## 2022-08-05 DIAGNOSIS — E782 Mixed hyperlipidemia: Secondary | ICD-10-CM | POA: Diagnosis not present

## 2022-08-05 DIAGNOSIS — M1A9XX1 Chronic gout, unspecified, with tophus (tophi): Secondary | ICD-10-CM | POA: Diagnosis not present

## 2022-08-11 DIAGNOSIS — E782 Mixed hyperlipidemia: Secondary | ICD-10-CM | POA: Diagnosis not present

## 2022-08-11 DIAGNOSIS — N401 Enlarged prostate with lower urinary tract symptoms: Secondary | ICD-10-CM | POA: Diagnosis not present

## 2022-08-11 DIAGNOSIS — E875 Hyperkalemia: Secondary | ICD-10-CM | POA: Diagnosis not present

## 2022-08-11 DIAGNOSIS — J4520 Mild intermittent asthma, uncomplicated: Secondary | ICD-10-CM | POA: Diagnosis not present

## 2022-08-11 DIAGNOSIS — Z85828 Personal history of other malignant neoplasm of skin: Secondary | ICD-10-CM | POA: Diagnosis not present

## 2022-08-11 DIAGNOSIS — E669 Obesity, unspecified: Secondary | ICD-10-CM | POA: Diagnosis not present

## 2022-08-11 DIAGNOSIS — D509 Iron deficiency anemia, unspecified: Secondary | ICD-10-CM | POA: Diagnosis not present

## 2022-08-11 DIAGNOSIS — I10 Essential (primary) hypertension: Secondary | ICD-10-CM | POA: Diagnosis not present

## 2022-08-11 DIAGNOSIS — M1A9XX1 Chronic gout, unspecified, with tophus (tophi): Secondary | ICD-10-CM | POA: Diagnosis not present

## 2022-08-11 DIAGNOSIS — J309 Allergic rhinitis, unspecified: Secondary | ICD-10-CM | POA: Diagnosis not present

## 2022-08-11 DIAGNOSIS — Z96612 Presence of left artificial shoulder joint: Secondary | ICD-10-CM | POA: Diagnosis not present

## 2022-08-11 DIAGNOSIS — R944 Abnormal results of kidney function studies: Secondary | ICD-10-CM | POA: Diagnosis not present

## 2022-08-22 DIAGNOSIS — R69 Illness, unspecified: Secondary | ICD-10-CM | POA: Diagnosis not present

## 2022-09-02 ENCOUNTER — Other Ambulatory Visit: Payer: Self-pay

## 2022-09-02 MED ORDER — FINASTERIDE 5 MG PO TABS: 5.0000 mg | ORAL_TABLET | Freq: Every day | ORAL | 0 refills | Status: DC

## 2022-09-02 MED ORDER — SILODOSIN 8 MG PO CAPS: ORAL_CAPSULE | ORAL | 0 refills | Status: DC

## 2022-09-30 ENCOUNTER — Other Ambulatory Visit: Payer: Self-pay | Admitting: Urology

## 2022-10-01 ENCOUNTER — Other Ambulatory Visit: Payer: Self-pay | Admitting: Urology

## 2022-10-25 ENCOUNTER — Ambulatory Visit: Payer: Medicare HMO | Admitting: Urology

## 2022-10-25 ENCOUNTER — Encounter: Payer: Self-pay | Admitting: Urology

## 2022-10-25 ENCOUNTER — Telehealth: Payer: Self-pay

## 2022-10-25 VITALS — BP 156/79 | HR 67

## 2022-10-25 DIAGNOSIS — N401 Enlarged prostate with lower urinary tract symptoms: Secondary | ICD-10-CM

## 2022-10-25 DIAGNOSIS — R339 Retention of urine, unspecified: Secondary | ICD-10-CM

## 2022-10-25 DIAGNOSIS — N138 Other obstructive and reflux uropathy: Secondary | ICD-10-CM | POA: Diagnosis not present

## 2022-10-25 DIAGNOSIS — K4090 Unilateral inguinal hernia, without obstruction or gangrene, not specified as recurrent: Secondary | ICD-10-CM

## 2022-10-25 LAB — URINALYSIS, ROUTINE W REFLEX MICROSCOPIC
Bilirubin, UA: NEGATIVE
Glucose, UA: NEGATIVE
Nitrite, UA: NEGATIVE
RBC, UA: NEGATIVE
Specific Gravity, UA: 1.03 (ref 1.005–1.030)
Urobilinogen, Ur: 1 mg/dL (ref 0.2–1.0)
pH, UA: 5.5 (ref 5.0–7.5)

## 2022-10-25 LAB — MICROSCOPIC EXAMINATION: Bacteria, UA: NONE SEEN

## 2022-10-25 LAB — BLADDER SCAN AMB NON-IMAGING: Scan Result: 0

## 2022-10-25 MED ORDER — FINASTERIDE 5 MG PO TABS: 5.0000 mg | ORAL_TABLET | Freq: Every day | ORAL | 11 refills | Status: DC

## 2022-10-25 NOTE — Telephone Encounter (Signed)
Pharmacy removed from patient chart.

## 2022-10-25 NOTE — Progress Notes (Unsigned)
post void residual=0 ?

## 2022-10-25 NOTE — Patient Instructions (Signed)

## 2022-10-25 NOTE — Progress Notes (Unsigned)
10/25/2022 10:47 AM   Geoffrey Patterson [birth year] [id number]  Referring provider: Benita Stabile, MD [address] [place],  [place] [address]  No chief complaint on file.   HPI:    PMH: Past Medical History:  Diagnosis Date   Dysrhythmia    a-fib history   Gout    Hypertension     Surgical History: Past Surgical History:  Procedure Laterality Date   COLONOSCOPY     COLONOSCOPY N/A 12/30/2016   Procedure: COLONOSCOPY;  Surgeon: Geoffrey Hippo, MD;  [facility];  Service: Endoscopy;  Laterality: N/A;  1030   CYSTOSCOPY     CYSTOSCOPY N/A 04/15/2022   Procedure: CYSTOSCOPY;  Surgeon: Geoffrey Gauze, MD;  Location: [facility];  Service: Urology;  Laterality: N/A;   CYSTOSCOPY N/A 07/15/2022   Procedure: CYSTOSCOPY;  Surgeon: Geoffrey Gauze, MD;  [facility];  Service: Urology;  Laterality: N/A;   CYSTOSCOPY WITH INSERTION OF UROLIFT N/A 05/27/2022   Procedure: CYSTOSCOPY WITH INSERTION OF UROLIFT;  Surgeon: Geoffrey Gauze, MD;  [facility];  Service: Urology;  Laterality: N/A;   POLYPECTOMY  12/30/2016   Procedure: POLYPECTOMY;  Surgeon: Geoffrey Hippo, MD;  [facility];  Service: Endoscopy;;  colon    REVERSE SHOULDER ARTHROPLASTY Left 03/07/2022   Procedure: REVERSE SHOULDER ARTHROPLASTY;  Surgeon: Geoffrey Lucy, MD;  [facility];  Service: Orthopedics;  Laterality: Left;   TONSILLECTOMY     TRANSURETHRAL RESECTION OF BLADDER TUMOR N/A 04/15/2022   Procedure: TRANSURETHRAL RESECTION OF BLADDER TUMOR (TURBT);  Surgeon: Geoffrey Gauze, MD;  [facility];  Service: Urology;  Laterality: N/A;   TRANSURETHRAL RESECTION OF PROSTATE N/A 07/15/2022   Procedure: TRANSURETHRAL RESECTION OF THE PROSTATE (TURP);  Surgeon: Geoffrey Gauze, MD;  [facility];  Service: Urology;  Laterality: N/A;    Home Medications:  Allergies as of 10/25/2022   No Known Allergies      Medication List        Accurate as  of Oct 25, 2022 10:47 AM. If you have any questions, ask your nurse or doctor.          allopurinol 300 MG tablet Commonly known as: ZYLOPRIM Take 1 tablet (300 mg total) by mouth daily.   diltiazem 120 MG 24 hr capsule Commonly known as: CARDIZEM CD TAKE 1 CAPSULE BY MOUTH EVERY DAY   fexofenadine 180 MG tablet Commonly known as: ALLEGRA Take 1 tablet (180 mg total) by mouth daily.   finasteride 5 MG tablet Commonly known as: PROSCAR TAKE ONE TABLET BY MOUTH ONCE DAILY.   HYDROcodone-acetaminophen 5-325 MG tablet Commonly known as: Norco Take 1 tablet by mouth every 6 (six) hours as needed for moderate pain.   rosuvastatin 5 MG tablet Commonly known as: CRESTOR Take 1 tablet (5 mg total) by mouth daily. What changed: when to take this   sennosides-docusate sodium 8.6-50 MG tablet Commonly known as: SENOKOT-S Take 2 tablets by mouth daily.   silodosin 8 MG Caps capsule Commonly known as: RAPAFLO TAKE (1) CAPSULE BY MOUTH TWICE DAILY.   tamsulosin 0.4 MG Caps capsule Commonly known as: FLOMAX Take 1 capsule (0.4 mg total) by mouth daily after supper.   traMADol 50 MG tablet Commonly known as: Ultram Take 1 tablet (50 mg total) by mouth every 6 (six) hours as needed.   valACYclovir 1000 MG tablet Commonly known as: VALTREX Take 2 tablets (2,000 mg total) by mouth 2 (  two) times daily as needed (fever blisters).        Allergies: No Known Allergies  Family History: Family History  Problem Relation Age of Onset   Hypertension Mother    Hypertension Father    Diabetic kidney disease Father     Social History:  reports that he has quit smoking. His smoking use included cigarettes. He has never used smokeless tobacco. He reports that he does not drink alcohol and does not use drugs.  ROS: All other review of systems were reviewed and are negative except what is noted above in HPI  Physical Exam: BP (!) 156/79   Pulse 67   Constitutional:  Alert and  oriented, No acute distress. HEENT: Geoffrey Patterson, moist mucus membranes.  Trachea midline, no masses. Cardiovascular: No clubbing, cyanosis, or edema. Respiratory: Normal respiratory effort, no increased work of breathing. GI: Abdomen is soft, nontender, nondistended, no abdominal masses GU: No CVA tenderness.  Lymph: No cervical or inguinal lymphadenopathy. Skin: No rashes, bruises or suspicious lesions. Neurologic: Grossly intact, no focal deficits, moving all 4 extremities. Psychiatric: Normal mood and affect.  Laboratory Data: Lab Results  Component Value Date   WBC 19.1 (H) 07/16/2022   HGB 13.1 07/16/2022   HCT 39.1 07/16/2022   MCV 88.5 07/16/2022   PLT 227 07/16/2022    Lab Results  Component Value Date   CREATININE 0.86 07/16/2022    No results found for: "PSA"  No results found for: "TESTOSTERONE"  No results found for: "HGBA1C"  Urinalysis    Component Value Date/Time   COLORURINE AMBER (A) 03/14/2022 1309   APPEARANCEUR Hazy (A) 04/29/2022 1516   LABSPEC 1.010 03/14/2022 1309   PHURINE 6.0 03/14/2022 1309   GLUCOSEU Negative 04/29/2022 1516   HGBUR LARGE (A) 03/14/2022 1309   BILIRUBINUR Negative 04/29/2022 1516   KETONESUR NEGATIVE 03/14/2022 1309   PROTEINUR Negative 04/29/2022 1516   PROTEINUR 100 (A) 03/14/2022 1309   NITRITE Negative 04/29/2022 1516   NITRITE NEGATIVE 03/14/2022 1309   LEUKOCYTESUR 2+ (A) 04/29/2022 1516   LEUKOCYTESUR LARGE (A) 03/14/2022 1309    Lab Results  Component Value Date   LABMICR See below: 04/29/2022   WBCUA 11-30 (A) 04/29/2022   LABEPIT 0-10 04/29/2022   BACTERIA Few 04/29/2022    Pertinent Imaging: *** No results found for this or any previous visit.  No results found for this or any previous visit.  No results found for this or any previous visit.  No results found for this or any previous visit.  Results for orders placed during the hospital encounter of 03/14/22  US Renal  Narrative CLINICAL DATA:   Acute kidney injury.  EXAM: RENAL / URINARY TRACT ULTRASOUND COMPLETE  COMPARISON:  None Available.  FINDINGS: Right Kidney:  Renal measurements: 12.6 x 7.6 x 6.1 cm = volume: 305 mL. Echogenicity within normal limits. No mass or hydronephrosis visualized.  Left Kidney:  Renal measurements: 13.1 x 7.9 x 6.6 cm = volume: 358 mL. Echogenicity within normal limits. No mass or hydronephrosis visualized.  Bladder:  There appears to be a solid mass in the urinary bladder measuring 6.2 x 5.5 x 2.9 cm.  Other:  None.  IMPRESSION: Kidneys are unremarkable. However, there appears to be a solid mass in the urinary bladder measuring 6.2 x 5.5 x 2.9 cm. Further evaluation with CT scan or cystoscopy is recommended. These results will be called to the ordering clinician or representative by the Radiologist Assistant, and communication documented in the PACS or  zVision Dashboard.   Electronically Signed By: Lupita Raider M.D. On: 03/15/2022 13:17  No valid procedures specified. No results found for this or any previous visit.  No results found for this or any previous visit.   Assessment & Plan:    1. Benign prostatic hyperplasia with urinary obstruction -continue finasteride 5mg  daily - Urinalysis, Routine w reflex microscopic - BLADDER SCAN AMB NON-IMAGING  2. Urinary retention resolved   No follow-ups on file.  Wilkie Aye, MD  [facility]

## 2022-10-25 NOTE — Telephone Encounter (Signed)
Patient requested all pharmacy's apart from [facility] be removed from his chart.    Thank you

## 2022-11-23 ENCOUNTER — Encounter: Payer: Self-pay | Admitting: General Surgery

## 2022-11-23 ENCOUNTER — Ambulatory Visit: Payer: Medicare HMO | Admitting: General Surgery

## 2022-11-23 VITALS — BP 150/77 | HR 60 | Temp 97.8°F | Resp 14 | Ht 73.0 in | Wt 232.0 lb

## 2022-11-23 DIAGNOSIS — K4090 Unilateral inguinal hernia, without obstruction or gangrene, not specified as recurrent: Secondary | ICD-10-CM

## 2022-11-23 NOTE — Progress Notes (Signed)
Geoffrey Patterson; [id number]; 08/29/1950   HPI Patient is a 72 year old white male who was referred to my care by Dr. Ronne Binning of urology for evaluation and treatment of a right inguinal hernia.  Patient states he has had a large right inguinal hernia for many years.  It occasionally causes discomfort as it is quite large.  He denies any nausea or vomiting.  He has never had an episode of incarceration. Past Medical History:  Diagnosis Date   Dysrhythmia    a-fib history   Gout    Hypertension     Past Surgical History:  Procedure Laterality Date   COLONOSCOPY     COLONOSCOPY N/A 12/30/2016   Procedure: COLONOSCOPY;  Surgeon: Malissa Hippo, MD;  [facility];  Service: Endoscopy;  Laterality: N/A;  1030   CYSTOSCOPY     CYSTOSCOPY N/A 04/15/2022   Procedure: CYSTOSCOPY;  Surgeon: Malen Gauze, MD;  [facility]: [facility];  Service: Urology;  Laterality: N/A;   CYSTOSCOPY N/A 07/15/2022   Procedure: CYSTOSCOPY;  Surgeon: Malen Gauze, MD;  [facility]: [facility];  Service: Urology;  Laterality: N/A;   CYSTOSCOPY WITH INSERTION OF UROLIFT N/A 05/27/2022   Procedure: CYSTOSCOPY WITH INSERTION OF UROLIFT;  Surgeon: Malen Gauze, MD;  [facility];  Service: Urology;  Laterality: N/A;   POLYPECTOMY  12/30/2016   Procedure: POLYPECTOMY;  Surgeon: Malissa Hippo, MD;  [facility];  Service: Endoscopy;;  colon    REVERSE SHOULDER ARTHROPLASTY Left 03/07/2022   Procedure: REVERSE SHOULDER ARTHROPLASTY;  Surgeon: Teryl Lucy, MD;  [facility];  Service: Orthopedics;  Laterality: Left;   TONSILLECTOMY     TRANSURETHRAL RESECTION OF BLADDER TUMOR N/A 04/15/2022   Procedure: TRANSURETHRAL RESECTION OF BLADDER TUMOR (TURBT);  Surgeon: Malen Gauze, MD;  [facility];  Service: Urology;  Laterality: N/A;   TRANSURETHRAL RESECTION OF PROSTATE N/A 07/15/2022   Procedure: TRANSURETHRAL RESECTION OF THE PROSTATE (TURP);  Surgeon: Malen Gauze,  MD;  [facility];  Service: Urology;  Laterality: N/A;    Family History  Problem Relation Age of Onset   Hypertension Mother    Hypertension Father    Diabetic kidney disease Father     Current Outpatient Medications on File Prior to Visit  Medication Sig Dispense Refill   allopurinol (ZYLOPRIM) 300 MG tablet Take 1 tablet (300 mg total) by mouth daily. 30 tablet 0   diltiazem (CARDIZEM CD) 120 MG 24 hr capsule TAKE 1 CAPSULE BY MOUTH EVERY DAY 30 capsule 0   finasteride (PROSCAR) 5 MG tablet Take 1 tablet (5 mg total) by mouth daily. 30 tablet 11   rosuvastatin (CRESTOR) 5 MG tablet Take 1 tablet (5 mg total) by mouth daily. (Patient taking differently: Take 5 mg by mouth at bedtime.) 30 tablet 0   valACYclovir (VALTREX) 1000 MG tablet Take 2 tablets (2,000 mg total) by mouth 2 (two) times daily as needed (fever blisters). 20 tablet 0   fexofenadine (ALLEGRA) 180 MG tablet Take 1 tablet (180 mg total) by mouth daily. (Patient not taking: Reported on 07/09/2022) 30 tablet 0   sennosides-docusate sodium (SENOKOT-S) 8.6-50 MG tablet Take 2 tablets by mouth daily. (Patient not taking: Reported on 07/09/2022) 30 tablet 1   traMADol (ULTRAM) 50 MG tablet Take 1 tablet (50 mg total) by mouth every 6 (six) hours as needed. (Patient not taking: Reported on 07/09/2022) 15 tablet 0   No current facility-administered medications on file prior to visit.  No Known Allergies  Social History   Substance and Sexual Activity  Alcohol Use No    Social History   Tobacco Use  Smoking Status Former   Types: Cigarettes  Smokeless Tobacco Never    Review of Systems  Constitutional: Negative.   HENT: Negative.    Eyes: Negative.   Respiratory: Negative.    Cardiovascular: Negative.   Gastrointestinal: Negative.   Genitourinary: Negative.   Musculoskeletal: Negative.   Skin: Negative.   Neurological: Negative.   Endo/Heme/Allergies: Negative.   Psychiatric/Behavioral: Negative.       Objective   Vitals:   11/23/22 0945  BP: (!) 150/77  Pulse: 60  Resp: 14  Temp: 97.8 F (36.6 C)  SpO2: 96%    Physical Exam Vitals reviewed.  Constitutional:      Appearance: Normal appearance. He is not ill-appearing.  HENT:     Head: Normocephalic and atraumatic.  Cardiovascular:     Rate and Rhythm: Normal rate and regular rhythm.     Heart sounds: Normal heart sounds. No murmur heard.    No friction rub. No gallop.  Pulmonary:     Effort: Pulmonary effort is normal. No respiratory distress.     Breath sounds: Normal breath sounds. No stridor. No wheezing, rhonchi or rales.  Abdominal:     General: Bowel sounds are normal. There is no distension.     Palpations: Abdomen is soft. There is no mass.     Tenderness: There is no abdominal tenderness. There is no guarding or rebound.     Hernia: A hernia is present.     Comments: Very large right inguinal hernia containing bowel.  It is difficult to reduce given its size.  Is nontender.  No left inguinal hernia appreciated.  Genitourinary:    Testes: Normal.  Skin:    General: Skin is warm and dry.  Neurological:     Mental Status: He is alert and oriented to person, place, and time.     Assessment  Large right inguinal hernia Plan  I told the patient that should he becomes more symptomatic or desires, I could proceed with a robotic assisted laparoscopic right inguinal herniorrhaphy with mesh.  The risks of the surgery include bleeding, infection, and the increased chance of recurrence and due to its large size.  He states currently that he has been living with it and will think about any surgical intervention should he become more symptomatic.  Follow-up as needed.

## 2022-12-01 ENCOUNTER — Other Ambulatory Visit: Payer: Self-pay | Admitting: Urology

## 2022-12-06 DIAGNOSIS — Z9181 History of falling: Secondary | ICD-10-CM | POA: Diagnosis not present

## 2022-12-06 DIAGNOSIS — E785 Hyperlipidemia, unspecified: Secondary | ICD-10-CM | POA: Diagnosis not present

## 2022-12-06 DIAGNOSIS — Z823 Family history of stroke: Secondary | ICD-10-CM | POA: Diagnosis not present

## 2022-12-06 DIAGNOSIS — I129 Hypertensive chronic kidney disease with stage 1 through stage 4 chronic kidney disease, or unspecified chronic kidney disease: Secondary | ICD-10-CM | POA: Diagnosis not present

## 2022-12-06 DIAGNOSIS — M1990 Unspecified osteoarthritis, unspecified site: Secondary | ICD-10-CM | POA: Diagnosis not present

## 2022-12-06 DIAGNOSIS — E669 Obesity, unspecified: Secondary | ICD-10-CM | POA: Diagnosis not present

## 2022-12-06 DIAGNOSIS — Z87891 Personal history of nicotine dependence: Secondary | ICD-10-CM | POA: Diagnosis not present

## 2022-12-06 DIAGNOSIS — N182 Chronic kidney disease, stage 2 (mild): Secondary | ICD-10-CM | POA: Diagnosis not present

## 2022-12-06 DIAGNOSIS — Z8249 Family history of ischemic heart disease and other diseases of the circulatory system: Secondary | ICD-10-CM | POA: Diagnosis not present

## 2022-12-06 DIAGNOSIS — N400 Enlarged prostate without lower urinary tract symptoms: Secondary | ICD-10-CM | POA: Diagnosis not present

## 2023-01-31 ENCOUNTER — Other Ambulatory Visit: Payer: Self-pay | Admitting: Urology

## 2023-02-01 ENCOUNTER — Other Ambulatory Visit: Payer: Self-pay | Admitting: Urology

## 2023-02-03 DIAGNOSIS — E782 Mixed hyperlipidemia: Secondary | ICD-10-CM | POA: Diagnosis not present

## 2023-02-08 DIAGNOSIS — Z0000 Encounter for general adult medical examination without abnormal findings: Secondary | ICD-10-CM | POA: Diagnosis not present

## 2023-02-09 DIAGNOSIS — E875 Hyperkalemia: Secondary | ICD-10-CM | POA: Diagnosis not present

## 2023-02-09 DIAGNOSIS — Z86010 Personal history of colonic polyps: Secondary | ICD-10-CM | POA: Diagnosis not present

## 2023-02-09 DIAGNOSIS — J4520 Mild intermittent asthma, uncomplicated: Secondary | ICD-10-CM | POA: Diagnosis not present

## 2023-02-09 DIAGNOSIS — N401 Enlarged prostate with lower urinary tract symptoms: Secondary | ICD-10-CM | POA: Diagnosis not present

## 2023-02-09 DIAGNOSIS — D509 Iron deficiency anemia, unspecified: Secondary | ICD-10-CM | POA: Diagnosis not present

## 2023-02-09 DIAGNOSIS — R944 Abnormal results of kidney function studies: Secondary | ICD-10-CM | POA: Diagnosis not present

## 2023-02-09 DIAGNOSIS — E782 Mixed hyperlipidemia: Secondary | ICD-10-CM | POA: Diagnosis not present

## 2023-02-09 DIAGNOSIS — Z85828 Personal history of other malignant neoplasm of skin: Secondary | ICD-10-CM | POA: Diagnosis not present

## 2023-02-09 DIAGNOSIS — Z96612 Presence of left artificial shoulder joint: Secondary | ICD-10-CM | POA: Diagnosis not present

## 2023-02-09 DIAGNOSIS — B001 Herpesviral vesicular dermatitis: Secondary | ICD-10-CM | POA: Diagnosis not present

## 2023-02-09 DIAGNOSIS — M1A9XX1 Chronic gout, unspecified, with tophus (tophi): Secondary | ICD-10-CM | POA: Diagnosis not present

## 2023-02-09 DIAGNOSIS — I10 Essential (primary) hypertension: Secondary | ICD-10-CM | POA: Diagnosis not present

## 2023-03-09 DIAGNOSIS — Z23 Encounter for immunization: Secondary | ICD-10-CM | POA: Diagnosis not present

## 2023-03-09 DIAGNOSIS — I10 Essential (primary) hypertension: Secondary | ICD-10-CM | POA: Diagnosis not present

## 2023-03-31 ENCOUNTER — Other Ambulatory Visit: Payer: Self-pay | Admitting: Urology

## 2023-04-01 ENCOUNTER — Other Ambulatory Visit: Payer: Self-pay | Admitting: Urology

## 2023-04-25 ENCOUNTER — Ambulatory Visit: Payer: Medicare HMO | Admitting: Urology

## 2023-06-29 ENCOUNTER — Other Ambulatory Visit: Payer: Self-pay | Admitting: Urology

## 2023-07-05 ENCOUNTER — Telehealth: Payer: Self-pay

## 2023-07-05 NOTE — Telephone Encounter (Signed)
PA started for Silodosin 8 mg cap Key: [id number]

## 2023-07-06 ENCOUNTER — Telehealth: Payer: Self-pay

## 2023-07-06 NOTE — Telephone Encounter (Signed)
I spoke with patient's sister and informed her per Dr. Ronne Binning patient does not need to continue Rapaflo rx after having a TURP procedure.  She voiced understanding and will notify patient.

## 2023-07-19 DIAGNOSIS — M1990 Unspecified osteoarthritis, unspecified site: Secondary | ICD-10-CM | POA: Diagnosis not present

## 2023-07-19 DIAGNOSIS — R269 Unspecified abnormalities of gait and mobility: Secondary | ICD-10-CM | POA: Diagnosis not present

## 2023-07-19 DIAGNOSIS — Z9181 History of falling: Secondary | ICD-10-CM | POA: Diagnosis not present

## 2023-07-19 DIAGNOSIS — I4891 Unspecified atrial fibrillation: Secondary | ICD-10-CM | POA: Diagnosis not present

## 2023-07-19 DIAGNOSIS — I129 Hypertensive chronic kidney disease with stage 1 through stage 4 chronic kidney disease, or unspecified chronic kidney disease: Secondary | ICD-10-CM | POA: Diagnosis not present

## 2023-07-19 DIAGNOSIS — Z87891 Personal history of nicotine dependence: Secondary | ICD-10-CM | POA: Diagnosis not present

## 2023-07-19 DIAGNOSIS — Z8249 Family history of ischemic heart disease and other diseases of the circulatory system: Secondary | ICD-10-CM | POA: Diagnosis not present

## 2023-07-19 DIAGNOSIS — D6869 Other thrombophilia: Secondary | ICD-10-CM | POA: Diagnosis not present

## 2023-07-19 DIAGNOSIS — Z823 Family history of stroke: Secondary | ICD-10-CM | POA: Diagnosis not present

## 2023-07-19 DIAGNOSIS — E669 Obesity, unspecified: Secondary | ICD-10-CM | POA: Diagnosis not present

## 2023-07-19 DIAGNOSIS — E785 Hyperlipidemia, unspecified: Secondary | ICD-10-CM | POA: Diagnosis not present

## 2023-07-19 DIAGNOSIS — R32 Unspecified urinary incontinence: Secondary | ICD-10-CM | POA: Diagnosis not present

## 2023-08-08 DIAGNOSIS — I10 Essential (primary) hypertension: Secondary | ICD-10-CM | POA: Diagnosis not present

## 2023-08-08 DIAGNOSIS — E782 Mixed hyperlipidemia: Secondary | ICD-10-CM | POA: Diagnosis not present

## 2023-08-08 DIAGNOSIS — M1A9XX1 Chronic gout, unspecified, with tophus (tophi): Secondary | ICD-10-CM | POA: Diagnosis not present

## 2023-08-08 DIAGNOSIS — Z125 Encounter for screening for malignant neoplasm of prostate: Secondary | ICD-10-CM | POA: Diagnosis not present

## 2023-08-18 DIAGNOSIS — R944 Abnormal results of kidney function studies: Secondary | ICD-10-CM | POA: Diagnosis not present

## 2023-08-18 DIAGNOSIS — M1A9XX1 Chronic gout, unspecified, with tophus (tophi): Secondary | ICD-10-CM | POA: Diagnosis not present

## 2023-08-18 DIAGNOSIS — R600 Localized edema: Secondary | ICD-10-CM | POA: Diagnosis not present

## 2023-08-18 DIAGNOSIS — J4520 Mild intermittent asthma, uncomplicated: Secondary | ICD-10-CM | POA: Diagnosis not present

## 2023-08-18 DIAGNOSIS — N401 Enlarged prostate with lower urinary tract symptoms: Secondary | ICD-10-CM | POA: Diagnosis not present

## 2023-08-18 DIAGNOSIS — D509 Iron deficiency anemia, unspecified: Secondary | ICD-10-CM | POA: Diagnosis not present

## 2023-08-18 DIAGNOSIS — E669 Obesity, unspecified: Secondary | ICD-10-CM | POA: Diagnosis not present

## 2023-08-18 DIAGNOSIS — E875 Hyperkalemia: Secondary | ICD-10-CM | POA: Diagnosis not present

## 2023-08-18 DIAGNOSIS — E782 Mixed hyperlipidemia: Secondary | ICD-10-CM | POA: Diagnosis not present

## 2023-08-18 DIAGNOSIS — J309 Allergic rhinitis, unspecified: Secondary | ICD-10-CM | POA: Diagnosis not present

## 2023-08-18 DIAGNOSIS — I10 Essential (primary) hypertension: Secondary | ICD-10-CM | POA: Diagnosis not present

## 2023-08-18 DIAGNOSIS — E870 Hyperosmolality and hypernatremia: Secondary | ICD-10-CM | POA: Diagnosis not present

## 2023-08-24 DIAGNOSIS — Z1211 Encounter for screening for malignant neoplasm of colon: Secondary | ICD-10-CM | POA: Diagnosis not present

## 2023-08-30 LAB — EXTERNAL GENERIC LAB PROCEDURE: COLOGUARD: POSITIVE — AB

## 2023-09-07 DIAGNOSIS — D044 Carcinoma in situ of skin of scalp and neck: Secondary | ICD-10-CM | POA: Diagnosis not present

## 2023-09-07 DIAGNOSIS — D485 Neoplasm of uncertain behavior of skin: Secondary | ICD-10-CM | POA: Diagnosis not present

## 2023-09-07 DIAGNOSIS — L570 Actinic keratosis: Secondary | ICD-10-CM | POA: Diagnosis not present

## 2023-09-09 ENCOUNTER — Encounter (INDEPENDENT_AMBULATORY_CARE_PROVIDER_SITE_OTHER): Payer: Self-pay | Admitting: *Deleted

## 2023-10-03 ENCOUNTER — Ambulatory Visit (INDEPENDENT_AMBULATORY_CARE_PROVIDER_SITE_OTHER): Admitting: Gastroenterology

## 2023-10-03 ENCOUNTER — Encounter (INDEPENDENT_AMBULATORY_CARE_PROVIDER_SITE_OTHER): Payer: Self-pay | Admitting: Gastroenterology

## 2023-10-03 VITALS — BP 124/78 | HR 76 | Temp 97.8°F | Ht 73.0 in | Wt 242.5 lb

## 2023-10-03 DIAGNOSIS — R195 Other fecal abnormalities: Secondary | ICD-10-CM | POA: Diagnosis not present

## 2023-10-03 MED ORDER — PEG 3350-KCL-NA BICARB-NACL 420 G PO SOLR: 4000.0000 mL | Freq: Once | ORAL | 0 refills | Status: AC

## 2023-10-03 NOTE — Progress Notes (Signed)
 Samantha Cress, M.D. Gastroenterology & Hepatology [facility] [address] [place], [place] [address] Primary Care Physician: Omie Bickers, MD [address] [place] [address]  Referring MD: PCP  Chief Complaint:   positive Cologuard.  History of Present Illness: Geoffrey Patterson is a 73 y.o. male with past medical history of A-fib, gout and hypertension, who presents for evaluation of positive Cologuard.  Patient had a positive Cologuard on 08/24/2023.  The patient denies any complaints, including nausea, vomiting, fever, chills, hematochezia, melena, hematemesis, abdominal distention, abdominal pain, diarrhea, jaundice, pruritus or weight loss.  Last WGN:FAOZ years ago, no report available but patietn reports it was normal  Last Colonoscopy: 12/30/2016 - Two 4 to 10 mm from like polyps located side by side at the hepatic flexure. Both of these polyps were biopsied for histology and risks and residual polyps ablated with argon plasma coagulation ( [facility]) . - Diverticulosis in the sigmoid colon. - One 3 mm polyp in the rectum, removed with a cold snare. Resected and retrieved. - Internal hemorrhoids.  Polyps from the hepatic flexure were sessile serrated polyps.  FHx: neg for any gastrointestinal/liver disease, no malignancies Social: neg smoking, alcohol or illicit drug use Surgical: no abdominal surgeries  Past Medical History: Past Medical History:  Diagnosis Date   Dysrhythmia    a-fib history   Gout    Hypertension     Past Surgical History: Past Surgical History:  Procedure Laterality Date   COLONOSCOPY     COLONOSCOPY N/A 12/30/2016   Procedure: COLONOSCOPY;  Surgeon: Ruby Corporal, MD;  [facility];  Service: Endoscopy;  Laterality: N/A;  1030   CYSTOSCOPY     CYSTOSCOPY N/A 04/15/2022   Procedure: CYSTOSCOPY;  Surgeon: Marco Severs, MD;  [facility];  Service: Urology;  Laterality:  N/A;   CYSTOSCOPY N/A 07/15/2022   Procedure: CYSTOSCOPY;  Surgeon: Marco Severs, MD;  Location: [facility];  Service: Urology;  Laterality: N/A;   CYSTOSCOPY WITH INSERTION OF UROLIFT N/A 05/27/2022   Procedure: CYSTOSCOPY WITH INSERTION OF UROLIFT;  Surgeon: Marco Severs, MD;  [facility];  Service: Urology;  Laterality: N/A;   POLYPECTOMY  12/30/2016   Procedure: POLYPECTOMY;  Surgeon: Ruby Corporal, MD;  [facility];  Service: Endoscopy;;  colon    REVERSE SHOULDER ARTHROPLASTY Left 03/07/2022   Procedure: REVERSE SHOULDER ARTHROPLASTY;  Surgeon: Osa Blase, MD;  [facility];  Service: Orthopedics;  Laterality: Left;   TONSILLECTOMY     TRANSURETHRAL RESECTION OF BLADDER TUMOR N/A 04/15/2022   Procedure: TRANSURETHRAL RESECTION OF BLADDER TUMOR (TURBT);  Surgeon: Marco Severs, MD;  [facility]: [facility];  Service: Urology;  Laterality: N/A;   TRANSURETHRAL RESECTION OF PROSTATE N/A 07/15/2022   Procedure: TRANSURETHRAL RESECTION OF THE PROSTATE (TURP);  Surgeon: Marco Severs, MD;  Location: [facility];  Service: Urology;  Laterality: N/A;    Family History: Family History  Problem Relation Age of Onset   Hypertension Mother    Hypertension Father    Diabetic kidney disease Father     Social History: Social History   Tobacco Use  Smoking Status Former   Types: Cigarettes  Smokeless Tobacco Never   Social History   Substance and Sexual Activity  Alcohol Use No   Social History   Substance and Sexual Activity  Drug Use No    Allergies: No Known Allergies  Medications: Current Outpatient Medications  Medication  Sig Dispense Refill   allopurinol  (ZYLOPRIM ) 300 MG tablet Take 1 tablet (300 mg total) by mouth daily. 30 tablet 0   diltiazem  (CARDIZEM  CD) 120 MG 24 hr capsule TAKE 1 CAPSULE BY MOUTH EVERY DAY 30 capsule 0   fexofenadine  (ALLEGRA ) 180 MG tablet Take 1 tablet (180 mg total) by mouth daily. 30 tablet 0   rosuvastatin   (CRESTOR ) 5 MG tablet Take 1 tablet (5 mg total) by mouth daily. (Patient taking differently: Take 5 mg by mouth at bedtime.) 30 tablet 0   valACYclovir  (VALTREX ) 1000 MG tablet Take 2 tablets (2,000 mg total) by mouth 2 (two) times daily as needed (fever blisters). 20 tablet 0   finasteride  (PROSCAR ) 5 MG tablet Take 1 tablet (5 mg total) by mouth daily. 30 tablet 11   No current facility-administered medications for this visit.    Review of Systems: GENERAL: negative for malaise, night sweats HEENT: No changes in hearing or vision, no nose bleeds or other nasal problems. NECK: Negative for lumps, goiter, pain and significant neck swelling RESPIRATORY: Negative for cough, wheezing CARDIOVASCULAR: Negative for chest pain, leg swelling, palpitations, orthopnea GI: SEE HPI MUSCULOSKELETAL: Negative for joint pain or swelling, back pain, and muscle pain. SKIN: Negative for lesions, rash PSYCH: Negative for sleep disturbance, mood disorder and recent psychosocial stressors. HEMATOLOGY Negative for prolonged bleeding, bruising easily, and swollen nodes. ENDOCRINE: Negative for cold or heat intolerance, polyuria, polydipsia and goiter. NEURO: negative for tremor, gait imbalance, syncope and seizures. The remainder of the review of systems is noncontributory.   Physical Exam: BP 124/78 (BP Location: Left Arm, Patient Position: Sitting, Cuff Size: Large)   Pulse 76   Temp 97.8 F (36.6 C) (Temporal)   Ht 6\' 1"  (1.854 m)   Wt 242 lb 8 oz (110 kg)   BMI 31.99 kg/m  GENERAL: The patient is AO x3, in no acute distress. HEENT: Head is normocephalic and atraumatic. EOMI are intact. Mouth is well hydrated and without lesions. NECK: Supple. No masses LUNGS: Clear to auscultation. No presence of rhonchi/wheezing/rales. Adequate chest expansion HEART: RRR, normal s1 and s2. ABDOMEN: Soft, nontender, no guarding, no peritoneal signs, and nondistended. BS +. No masses. EXTREMITIES: Without any  cyanosis, clubbing, rash, lesions or edema. NEUROLOGIC: AOx3, no focal motor deficit. SKIN: no jaundice, no rashes   Imaging/Labs: as above  I personally reviewed and interpreted the available labs, imaging and endoscopic files.  Impression and Plan: Geoffrey Patterson is a 73 y.o. male with past medical history of A-fib, gout and hypertension, who presents for evaluation of positive Cologuard.  Patient does not have any high risk factors for colorectal cancer malignancy and has been asymptomatic.  However, he had a history of colonic polyps in the past and in his last colonoscopy the polyps were not resected but ablated.  I explained that there was the possibility of having residual polyp when this is performed and after ablation, the tissue can be fibrotic and difficult to resect, which may need possible surgical evaluation.    Discussed cologuard test results in detail, specifically what it means when the test is positive or negative.  Discussed that there is a possibility that even when the test is positive there may not be a polyp found on colonoscopy. More than 50% of the office visit was dedicated to discussing the procedure, including the day of and risks involved. Patient understands what the procedure involves including the benefits and any risks. Patient understands alternatives to the proposed procedure.  Risks including (but not limited to) bleeding, tearing of the lining (perforation), rupture of adjacent organs, problems with heart and lung function, infection, and medication reactions. A small percentage of complications may require surgery, hospitalization, repeat endoscopic procedure, and/or transfusion. A small percentage of polyps and other tumors may not be seen.  - Schedule colonoscopy  All questions were answered.      Samantha Cress, MD Gastroenterology and Hepatology [facility]

## 2023-10-03 NOTE — H&P (View-Only) (Signed)
 Samantha Cress, M.D. [facility] [address] [place], [place] [address] Primary Care Physician: Omie Bickers, MD [address] [place] [address]  Referring MD: PCP  Chief Complaint:   positive Cologuard.  History of Present Illness: Gabrielle L Castellana is a 73 y.o. male with past medical history of A-fib, gout and hypertension, who presents for evaluation of positive Cologuard.  Patient had a positive Cologuard on 08/24/2023.  The patient denies any complaints, including nausea, vomiting, fever, chills, hematochezia, melena, hematemesis, abdominal distention, abdominal pain, diarrhea, jaundice, pruritus or weight loss.  Last WGN:FAOZ years ago, no report available but patietn reports it was normal  Last Colonoscopy: 12/30/2016 - Two 4 to 10 mm from like polyps located side by side at the hepatic flexure. Both of these polyps were biopsied for histology and risks and residual polyps ablated with argon plasma coagulation ( APC) . - Diverticulosis in the sigmoid colon. - One 3 mm polyp in the rectum, removed with a cold snare. Resected and retrieved. - Internal hemorrhoids.  Polyps from the hepatic flexure were sessile serrated polyps.  FHx: neg for any gastrointestinal/liver disease, no malignancies Social: neg smoking, alcohol or illicit drug use Surgical: no abdominal surgeries  Past Medical History: Past Medical History:  Diagnosis Date   Dysrhythmia    a-fib history   Gout    Hypertension     Past Surgical History: Past Surgical History:  Procedure Laterality Date   COLONOSCOPY     COLONOSCOPY N/A 12/30/2016   Procedure: COLONOSCOPY;  Surgeon: Ruby Corporal, MD;  Location: [facility];  Service: Endoscopy;  Laterality: N/A;  1030   CYSTOSCOPY     CYSTOSCOPY N/A 04/15/2022   Procedure: CYSTOSCOPY;  Surgeon: Marco Severs, MD;  Location: [facility];  Service: Urology;  Laterality:  N/A;   CYSTOSCOPY N/A 07/15/2022   Procedure: CYSTOSCOPY;  Surgeon: Marco Severs, MD;  Location: [facility];  Service: Urology;  Laterality: N/A;   CYSTOSCOPY WITH INSERTION OF UROLIFT N/A 05/27/2022   Procedure: CYSTOSCOPY WITH INSERTION OF UROLIFT;  Surgeon: Marco Severs, MD;  Location: [facility];  Service: Urology;  Laterality: N/A;   POLYPECTOMY  12/30/2016   Procedure: POLYPECTOMY;  Surgeon: Ruby Corporal, MD;  Location: [facility];  Service: Endoscopy;;  colon    REVERSE SHOULDER ARTHROPLASTY Left 03/07/2022   Procedure: REVERSE SHOULDER ARTHROPLASTY;  Surgeon: Osa Blase, MD;  Location: [facility];  Service: Orthopedics;  Laterality: Left;   TONSILLECTOMY     TRANSURETHRAL RESECTION OF BLADDER TUMOR N/A 04/15/2022   Procedure: TRANSURETHRAL RESECTION OF BLADDER TUMOR (TURBT);  Surgeon: Marco Severs, MD;  Location: [facility];  Service: Urology;  Laterality: N/A;   TRANSURETHRAL RESECTION OF PROSTATE N/A 07/15/2022   Procedure: TRANSURETHRAL RESECTION OF THE PROSTATE (TURP);  Surgeon: Marco Severs, MD;  Location: [facility];  Service: Urology;  Laterality: N/A;    Family History: Family History  Problem Relation Age of Onset   Hypertension Mother    Hypertension Father    Diabetic kidney disease Father     Social History: Social History   Tobacco Use  Smoking Status Former   Types: Cigarettes  Smokeless Tobacco Never   Social History   Substance and Sexual Activity  Alcohol Use No   Social History   Substance and Sexual Activity  Drug Use No    Allergies: No Known Allergies  Medications: Current Outpatient Medications  Medication  Sig Dispense Refill   allopurinol  (ZYLOPRIM ) 300 MG tablet Take 1 tablet (300 mg total) by mouth daily. 30 tablet 0   diltiazem  (CARDIZEM  CD) 120 MG 24 hr capsule TAKE 1 CAPSULE BY MOUTH EVERY DAY 30 capsule 0   fexofenadine  (ALLEGRA ) 180 MG tablet Take 1 tablet (180 mg total) by mouth daily. 30 tablet 0   rosuvastatin   (CRESTOR ) 5 MG tablet Take 1 tablet (5 mg total) by mouth daily. (Patient taking differently: Take 5 mg by mouth at bedtime.) 30 tablet 0   valACYclovir  (VALTREX ) 1000 MG tablet Take 2 tablets (2,000 mg total) by mouth 2 (two) times daily as needed (fever blisters). 20 tablet 0   finasteride  (PROSCAR ) 5 MG tablet Take 1 tablet (5 mg total) by mouth daily. 30 tablet 11   No current facility-administered medications for this visit.    Review of Systems: GENERAL: negative for malaise, night sweats HEENT: No changes in hearing or vision, no nose bleeds or other nasal problems. NECK: Negative for lumps, goiter, pain and significant neck swelling RESPIRATORY: Negative for cough, wheezing CARDIOVASCULAR: Negative for chest pain, leg swelling, palpitations, orthopnea GI: SEE HPI MUSCULOSKELETAL: Negative for joint pain or swelling, back pain, and muscle pain. SKIN: Negative for lesions, rash PSYCH: Negative for sleep disturbance, mood disorder and recent psychosocial stressors. HEMATOLOGY Negative for prolonged bleeding, bruising easily, and swollen nodes. ENDOCRINE: Negative for cold or heat intolerance, polyuria, polydipsia and goiter. NEURO: negative for tremor, gait imbalance, syncope and seizures. The remainder of the review of systems is noncontributory.   Physical Exam: BP 124/78 (BP Location: Left Arm, Patient Position: Sitting, Cuff Size: Large)   Pulse 76   Temp 97.8 F (36.6 C) (Temporal)   Ht 6\' 1"  (1.854 m)   Wt 242 lb 8 oz (110 kg)   BMI 31.99 kg/m  GENERAL: The patient is AO x3, in no acute distress. HEENT: Head is normocephalic and atraumatic. EOMI are intact. Mouth is well hydrated and without lesions. NECK: Supple. No masses LUNGS: Clear to auscultation. No presence of rhonchi/wheezing/rales. Adequate chest expansion HEART: RRR, normal s1 and s2. ABDOMEN: Soft, nontender, no guarding, no peritoneal signs, and nondistended. BS +. No masses. EXTREMITIES: Without any  cyanosis, clubbing, rash, lesions or edema. NEUROLOGIC: AOx3, no focal motor deficit. SKIN: no jaundice, no rashes   Imaging/Labs: as above  I personally reviewed and interpreted the available labs, imaging and endoscopic files.  Impression and Plan: Jsean L Lahey is a 73 y.o. male with past medical history of A-fib, gout and hypertension, who presents for evaluation of positive Cologuard.  Patient does not have any high risk factors for colorectal cancer malignancy and has been asymptomatic.  However, he had a history of colonic polyps in the past and in his last colonoscopy the polyps were not resected but ablated.  I explained that there was the possibility of having residual polyp when this is performed and after ablation, the tissue can be fibrotic and difficult to resect, which may need possible surgical evaluation.    Discussed cologuard test results in detail, specifically what it means when the test is positive or negative.  Discussed that there is a possibility that even when the test is positive there may not be a polyp found on colonoscopy. More than 50% of the office visit was dedicated to discussing the procedure, including the day of and risks involved. Patient understands what the procedure involves including the benefits and any risks. Patient understands alternatives to the proposed procedure.  Risks including (but not limited to) bleeding, tearing of the lining (perforation), rupture of adjacent organs, problems with heart and lung function, infection, and medication reactions. A small percentage of complications may require surgery, hospitalization, repeat endoscopic procedure, and/or transfusion. A small percentage of polyps and other tumors may not be seen.  - Schedule colonoscopy  All questions were answered.      Samantha Cress, MD [facility]ology [facility]

## 2023-10-03 NOTE — Patient Instructions (Signed)
 Schedule colonoscopy

## 2023-10-07 ENCOUNTER — Other Ambulatory Visit: Payer: Self-pay

## 2023-10-07 ENCOUNTER — Encounter (HOSPITAL_COMMUNITY)
Admission: RE | Disposition: A | Discharge status: Home / Self Care | Payer: Self-pay | Source: Home / Self Care | Attending: Gastroenterology

## 2023-10-07 ENCOUNTER — Ambulatory Visit (HOSPITAL_COMMUNITY)
Admission: RE | Admit: 2023-10-07 | Discharge: 2023-10-07 | Disposition: A | Discharge status: Home / Self Care | Attending: Gastroenterology | Admitting: Gastroenterology

## 2023-10-07 ENCOUNTER — Encounter (HOSPITAL_COMMUNITY): Payer: Self-pay | Admitting: Gastroenterology

## 2023-10-07 ENCOUNTER — Ambulatory Visit (HOSPITAL_COMMUNITY): Payer: Self-pay | Admitting: Certified Registered Nurse Anesthetist

## 2023-10-07 DIAGNOSIS — K635 Polyp of colon: Secondary | ICD-10-CM | POA: Diagnosis not present

## 2023-10-07 DIAGNOSIS — K648 Other hemorrhoids: Secondary | ICD-10-CM | POA: Insufficient documentation

## 2023-10-07 DIAGNOSIS — K644 Residual hemorrhoidal skin tags: Secondary | ICD-10-CM | POA: Diagnosis not present

## 2023-10-07 DIAGNOSIS — K5730 Diverticulosis of large intestine without perforation or abscess without bleeding: Secondary | ICD-10-CM | POA: Insufficient documentation

## 2023-10-07 DIAGNOSIS — I4891 Unspecified atrial fibrillation: Secondary | ICD-10-CM | POA: Diagnosis not present

## 2023-10-07 DIAGNOSIS — I10 Essential (primary) hypertension: Secondary | ICD-10-CM | POA: Insufficient documentation

## 2023-10-07 DIAGNOSIS — R195 Other fecal abnormalities: Secondary | ICD-10-CM | POA: Diagnosis not present

## 2023-10-07 DIAGNOSIS — N289 Disorder of kidney and ureter, unspecified: Secondary | ICD-10-CM | POA: Insufficient documentation

## 2023-10-07 DIAGNOSIS — Z1211 Encounter for screening for malignant neoplasm of colon: Secondary | ICD-10-CM

## 2023-10-07 DIAGNOSIS — T184XXA Foreign body in colon, initial encounter: Secondary | ICD-10-CM | POA: Diagnosis not present

## 2023-10-07 DIAGNOSIS — Z87891 Personal history of nicotine dependence: Secondary | ICD-10-CM | POA: Insufficient documentation

## 2023-10-07 DIAGNOSIS — D123 Benign neoplasm of transverse colon: Secondary | ICD-10-CM

## 2023-10-07 DIAGNOSIS — Z8249 Family history of ischemic heart disease and other diseases of the circulatory system: Secondary | ICD-10-CM | POA: Diagnosis not present

## 2023-10-07 DIAGNOSIS — X58XXXA Exposure to other specified factors, initial encounter: Secondary | ICD-10-CM | POA: Insufficient documentation

## 2023-10-07 DIAGNOSIS — Z860100 Personal history of colon polyps, unspecified: Secondary | ICD-10-CM | POA: Diagnosis present

## 2023-10-07 HISTORY — PX: COLONOSCOPY: SHX5424

## 2023-10-07 LAB — HM COLONOSCOPY

## 2023-10-07 SURGERY — COLONOSCOPY
Anesthesia: General

## 2023-10-07 MED ORDER — PHENYLEPHRINE 80 MCG/ML (10ML) SYRINGE FOR IV PUSH (FOR BLOOD PRESSURE SUPPORT)
PREFILLED_SYRINGE | INTRAVENOUS | Status: DC | PRN
Start: 2023-10-07 — End: 2023-10-07
  Administered 2023-10-07 (×6): 80 ug via INTRAVENOUS

## 2023-10-07 MED ORDER — LACTATED RINGERS IV SOLN: INTRAVENOUS | Status: DC

## 2023-10-07 MED ORDER — PROPOFOL 1000 MG/100ML IV EMUL
INTRAVENOUS | Status: AC
  Filled 2023-10-07: qty 100

## 2023-10-07 MED ORDER — PROPOFOL 500 MG/50ML IV EMUL
INTRAVENOUS | Status: DC | PRN
  Administered 2023-10-07: 150 ug/kg/min via INTRAVENOUS
  Administered 2023-10-07: 60 mg via INTRAVENOUS

## 2023-10-07 MED ORDER — PHENYLEPHRINE 80 MCG/ML (10ML) SYRINGE FOR IV PUSH (FOR BLOOD PRESSURE SUPPORT)
PREFILLED_SYRINGE | INTRAVENOUS | Status: AC
  Filled 2023-10-07: qty 10

## 2023-10-07 MED ORDER — PROPOFOL 500 MG/50ML IV EMUL
INTRAVENOUS | Status: AC
  Filled 2023-10-07: qty 50

## 2023-10-07 MED ORDER — LACTATED RINGERS IV SOLN
INTRAVENOUS | Status: DC | PRN
Start: 2023-10-07 — End: 2023-10-07

## 2023-10-07 NOTE — Interval H&P Note (Signed)
 History and Physical Interval Note:  10/07/2023 7:07 AM  Geoffrey Patterson  has presented today for surgery, with the diagnosis of POSITIVE COLOGUARD.  The various methods of treatment have been discussed with the patient and family. After consideration of risks, benefits and other options for treatment, the patient has consented to  Procedure(s) with comments: COLONOSCOPY (N/A) - 7:30AM;ASA 1 as a surgical intervention.  The patient's history has been reviewed, patient examined, no change in status, stable for surgery.  I have reviewed the patient's chart and labs.  Questions were answered to the patient's satisfaction.     Sascha Palma Castaneda Mayorga

## 2023-10-07 NOTE — Transfer of Care (Signed)
 Immediate Anesthesia Transfer of Care Note  Patient: Geoffrey Patterson  Procedure(s) Performed: COLONOSCOPY  Patient Location: Endoscopy Unit  Anesthesia Type:General  Level of Consciousness: awake, alert , and oriented  Airway & Oxygen Therapy: Patient Spontanous Breathing  Post-op Assessment: Report given to RN, Post -op Vital signs reviewed and stable, Patient moving all extremities X 4, and Patient able to stick tongue midline  Post vital signs: Reviewed and stable  Last Vitals:  Vitals Value Taken Time  BP 102/59   Temp 98.6   Pulse 77   Resp 18   SpO2 94     Last Pain:  Vitals:   10/07/23 0711  TempSrc: Oral  PainSc: 0-No pain         Complications: No notable events documented.

## 2023-10-07 NOTE — Anesthesia Postprocedure Evaluation (Signed)
 Anesthesia Post Note  Patient: Geoffrey Patterson  Procedure(s) Performed: COLONOSCOPY  Patient location during evaluation: Phase II Anesthesia Type: General Level of consciousness: awake Pain management: pain level controlled Vital Signs Assessment: post-procedure vital signs reviewed and stable Respiratory status: spontaneous breathing and respiratory function stable Cardiovascular status: blood pressure returned to baseline and stable Postop Assessment: no headache and no apparent nausea or vomiting Anesthetic complications: no Comments: Late entry   No notable events documented.   Last Vitals:  Vitals:   10/07/23 0711 10/07/23 0808  BP: 132/89 (!) 102/59  Pulse: 87 83  Resp: (!) 21 17  Temp: 36.7 C 36.4 C  SpO2: 100% 95%    Last Pain:  Vitals:   10/07/23 0814  TempSrc:   PainSc: 0-No pain                 Coretha Dew

## 2023-10-07 NOTE — Op Note (Signed)
 [facility] Patient Name: Geoffrey Patterson Procedure Date: 10/07/2023 7:07 AM MRN: [id number] Date of Birth: 1950-09-15 Attending MD: Samantha Cress , , [id number] CSN: [id number] Age: 73 Admit Type: Outpatient Procedure:                Colonoscopy Indications:              Positive Cologuard test Providers:                Samantha Cress, Graydon Lazier RN, RN, Annell Barrow Referring MD:              Medicines:                Monitored Anesthesia Care Complications:            No immediate complications. Estimated Blood Loss:     Estimated blood loss: none. Procedure:                Pre-Anesthesia Assessment:                           - Prior to the procedure, a History and Physical                            was performed, and patient medications, allergies                            and sensitivities were reviewed. The patient's                            tolerance of previous anesthesia was reviewed.                           - The risks and benefits of the procedure and the                            sedation options and risks were discussed with the                            patient. All questions were answered and informed                            consent was obtained.                           After obtaining informed consent, the colonoscope                            was passed under direct vision. Throughout the                            procedure, the patient's blood pressure, pulse, and                            oxygen saturations were monitored continuously. The  [id number]-HQ190L ([id number]) scope was introduced through                            the anus and advanced to the the cecum, identified                            by appendiceal orifice and ileocecal valve. The                            colonoscopy was performed without difficulty. The                            patient tolerated the procedure well. The  quality                            of the bowel preparation was excellent. Scope In: 7:33:09 AM Scope Out: 8:03:53 AM Scope Withdrawal Time: 0 hours 25 minutes 19 seconds  Total Procedure Duration: 0 hours 30 minutes 44 seconds  Findings:      A 14 mm polyp was found in the hepatic flexure. The polyp was       semi-sessile. Area was successfully injected with 5 mL Eleview for a       lift polypectomy. Imaging was performed using white light and narrow       band imaging to visualize the mucosa and demarcate the polyp site after       injection for EMR purposes. The polyp exhibited some mild pitting, which       was concerning for underlying scarring due to previous manipulation. The       polyp was removed with a piecemeal technique using a cold snare (3       pieces). Resection and retrieval were complete.      A 5 mm polyp was found in the transverse colon. The polyp was sessile.       The polyp was removed with a cold snare. Resection and retrieval were       complete.      A 4 mm polyp was found in the descending colon. The polyp was sessile.       The polyp was removed with a cold snare. Resection and retrieval were       complete.      A few small-mouthed diverticula were found in the sigmoid colon and       ascending colon.      Non-bleeding internal hemorrhoids were found during retroflexion. The       hemorrhoids were small.      Hemorrhoids were found on perianal exam. Impression:               - One 14 mm polyp at the hepatic flexure, removed                            piecemeal using a cold snare. Resected and                            retrieved. Injected.                           - One 5  mm polyp in the transverse colon, removed                            with a cold snare. Resected and retrieved.                           - One 4 mm polyp in the descending colon, removed                            with a cold snare. Resected and retrieved.                           -  Diverticulosis in the sigmoid colon and in the                            ascending colon.                           - Non-bleeding internal hemorrhoids.                           - Hemorrhoids found on perianal exam. Moderate Sedation:      Per Anesthesia Care Recommendation:           - Discharge patient to home (ambulatory).                           - Resume previous diet.                           - Await pathology results.                           - Repeat colonoscopy in 6 months for surveillance                            after piecemeal polypectomy. Procedure Code(s):        --- Professional ---                           [phone], 59, Colonoscopy, flexible; with removal of                            tumor(s), polyp(s), or other lesion(s) by snare                            technique                           45381, Colonoscopy, flexible; with directed                            submucosal injection(s), any substance Diagnosis Code(s):        --- Professional ---                           D12.3, Benign neoplasm of  transverse colon (hepatic                            flexure or splenic flexure)                           D12.4, Benign neoplasm of descending colon                           K64.8, Other hemorrhoids                           R19.5, Other fecal abnormalities                           K57.30, Diverticulosis of large intestine without                            perforation or abscess without bleeding CPT copyright 2022 American Medical Association. All rights reserved. The codes documented in this report are preliminary and upon coder review may  be revised to meet current compliance requirements. Samantha Cress, MD Samantha Cress,  10/07/2023 8:14:00 AM This report has been signed electronically. Number of Addenda: 0

## 2023-10-07 NOTE — Anesthesia Preprocedure Evaluation (Signed)
 Anesthesia Evaluation  Patient identified by MRN, date of birth, ID band Patient awake    Reviewed: Allergy & Precautions, H&P , NPO status , Patient's Chart, lab work & pertinent test results, reviewed documented beta blocker date and time   Airway Mallampati: II  TM Distance: >3 FB Neck ROM: full    Dental no notable dental hx.    Pulmonary neg pulmonary ROS, shortness of breath, pneumonia, former smoker   Pulmonary exam normal breath sounds clear to auscultation       Cardiovascular Exercise Tolerance: Good hypertension, negative cardio ROS + dysrhythmias Atrial Fibrillation  Rhythm:regular Rate:Normal     Neuro/Psych negative neurological ROS  negative psych ROS   GI/Hepatic negative GI ROS, Neg liver ROS,,,  Endo/Other  negative endocrine ROS    Renal/GU Renal diseasenegative Renal ROS  negative genitourinary   Musculoskeletal   Abdominal   Peds  Hematology negative hematology ROS (+)   Anesthesia Other Findings   Reproductive/Obstetrics negative OB ROS                             Anesthesia Physical Anesthesia Plan  ASA: 2  Anesthesia Plan: General   Post-op Pain Management:    Induction:   PONV Risk Score and Plan: Propofol  infusion  Airway Management Planned:   Additional Equipment:   Intra-op Plan:   Post-operative Plan:   Informed Consent: I have reviewed the patients History and Physical, chart, labs and discussed the procedure including the risks, benefits and alternatives for the proposed anesthesia with the patient or authorized representative who has indicated his/her understanding and acceptance.     Dental Advisory Given  Plan Discussed with: CRNA  Anesthesia Plan Comments:        Anesthesia Quick Evaluation

## 2023-10-07 NOTE — Discharge Instructions (Signed)
You are being discharged to home.  Resume your previous diet.  We are waiting for your pathology results.  Your physician has recommended a repeat colonoscopy in six months for surveillance after today's piecemeal polypectomy.  

## 2023-10-10 ENCOUNTER — Encounter (INDEPENDENT_AMBULATORY_CARE_PROVIDER_SITE_OTHER): Payer: Self-pay | Admitting: *Deleted

## 2023-10-10 ENCOUNTER — Encounter (HOSPITAL_COMMUNITY): Payer: Self-pay | Admitting: Gastroenterology

## 2023-10-10 LAB — SURGICAL PATHOLOGY

## 2023-10-12 NOTE — Progress Notes (Signed)
 Agree with the path findings of "seed"

## 2023-10-17 ENCOUNTER — Other Ambulatory Visit: Payer: Self-pay | Admitting: Urology

## 2023-10-17 DIAGNOSIS — R339 Retention of urine, unspecified: Secondary | ICD-10-CM

## 2023-11-10 DIAGNOSIS — D044 Carcinoma in situ of skin of scalp and neck: Secondary | ICD-10-CM | POA: Diagnosis not present

## 2024-02-15 DIAGNOSIS — I10 Essential (primary) hypertension: Secondary | ICD-10-CM | POA: Diagnosis not present

## 2024-02-15 DIAGNOSIS — E782 Mixed hyperlipidemia: Secondary | ICD-10-CM | POA: Diagnosis not present

## 2024-02-21 DIAGNOSIS — J4520 Mild intermittent asthma, uncomplicated: Secondary | ICD-10-CM | POA: Diagnosis not present

## 2024-02-21 DIAGNOSIS — R600 Localized edema: Secondary | ICD-10-CM | POA: Diagnosis not present

## 2024-02-21 DIAGNOSIS — M1A9XX1 Chronic gout, unspecified, with tophus (tophi): Secondary | ICD-10-CM | POA: Diagnosis not present

## 2024-02-21 DIAGNOSIS — Z23 Encounter for immunization: Secondary | ICD-10-CM | POA: Diagnosis not present

## 2024-02-21 DIAGNOSIS — E870 Hyperosmolality and hypernatremia: Secondary | ICD-10-CM | POA: Diagnosis not present

## 2024-02-21 DIAGNOSIS — E782 Mixed hyperlipidemia: Secondary | ICD-10-CM | POA: Diagnosis not present

## 2024-02-21 DIAGNOSIS — D509 Iron deficiency anemia, unspecified: Secondary | ICD-10-CM | POA: Diagnosis not present

## 2024-02-21 DIAGNOSIS — N401 Enlarged prostate with lower urinary tract symptoms: Secondary | ICD-10-CM | POA: Diagnosis not present

## 2024-02-21 DIAGNOSIS — R944 Abnormal results of kidney function studies: Secondary | ICD-10-CM | POA: Diagnosis not present

## 2024-02-21 DIAGNOSIS — E875 Hyperkalemia: Secondary | ICD-10-CM | POA: Diagnosis not present

## 2024-02-21 DIAGNOSIS — I10 Essential (primary) hypertension: Secondary | ICD-10-CM | POA: Diagnosis not present

## 2024-02-21 DIAGNOSIS — J309 Allergic rhinitis, unspecified: Secondary | ICD-10-CM | POA: Diagnosis not present

## 2024-02-24 ENCOUNTER — Encounter (INDEPENDENT_AMBULATORY_CARE_PROVIDER_SITE_OTHER): Payer: Self-pay | Admitting: *Deleted

## 2024-03-28 ENCOUNTER — Encounter (INDEPENDENT_AMBULATORY_CARE_PROVIDER_SITE_OTHER): Payer: Self-pay | Admitting: Gastroenterology
